# Patient Record
Sex: Male | Born: 1945 | Race: White | Hispanic: No | Marital: Married | Smoking: Never smoker
Health system: Southern US, Community
[De-identification: ages and names within clinical notes are randomized; demographics above are authoritative.]

## PROBLEM LIST (undated history)

## (undated) DIAGNOSIS — N400 Enlarged prostate without lower urinary tract symptoms: Secondary | ICD-10-CM

## (undated) DIAGNOSIS — K219 Gastro-esophageal reflux disease without esophagitis: Secondary | ICD-10-CM

## (undated) DIAGNOSIS — R0600 Dyspnea, unspecified: Secondary | ICD-10-CM

## (undated) DIAGNOSIS — N200 Calculus of kidney: Secondary | ICD-10-CM

## (undated) DIAGNOSIS — M1990 Unspecified osteoarthritis, unspecified site: Secondary | ICD-10-CM

## (undated) DIAGNOSIS — I351 Nonrheumatic aortic (valve) insufficiency: Secondary | ICD-10-CM

## (undated) DIAGNOSIS — E785 Hyperlipidemia, unspecified: Secondary | ICD-10-CM

## (undated) DIAGNOSIS — K7689 Other specified diseases of liver: Secondary | ICD-10-CM

## (undated) DIAGNOSIS — M25531 Pain in right wrist: Secondary | ICD-10-CM

## (undated) DIAGNOSIS — Z87442 Personal history of urinary calculi: Secondary | ICD-10-CM

## (undated) HISTORY — PX: ANTERIOR CRUCIATE LIGAMENT REPAIR: SHX115

## (undated) HISTORY — PX: OTHER SURGICAL HISTORY: SHX169

## (undated) HISTORY — PX: KNEE SURGERY: SHX244

## (undated) HISTORY — PX: MRI: SHX5353

## (undated) HISTORY — PX: TONSILLECTOMY: SUR1361

---

## 2008-02-13 ENCOUNTER — Ambulatory Visit (HOSPITAL_COMMUNITY): Admission: RE | Admit: 2008-02-13 | Discharge: 2008-02-13 | Payer: Self-pay | Admitting: Urology

## 2008-02-13 HISTORY — PX: CYSTOSCOPY/RETROGRADE/URETEROSCOPY: SHX5316

## 2008-11-13 ENCOUNTER — Encounter: Admission: RE | Admit: 2008-11-13 | Discharge: 2008-11-13 | Payer: Self-pay | Admitting: Family Medicine

## 2010-12-06 NOTE — Op Note (Signed)
NAME:  Billy Erickson, ZEE NO.:  [id number]   MEDICAL RECORD NO.:  [id number]          PATIENT TYPE:  AMB   LOCATION:  DAY                          FACILITY:  [facility]   PHYSICIAN:  Valetta Fuller, M.D.  DATE OF BIRTH:  1945-12-26   DATE OF PROCEDURE:  02/13/2008  DATE OF DISCHARGE:  02/13/2008                               OPERATIVE REPORT   PREOPERATIVE DIAGNOSIS:  Right distal ureteral calculus 9 mm.   POSTOPERATIVE DIAGNOSIS:  Right distal ureteral calculus 9 mm.   PROCEDURE PERFORMED:  Cystoscopy, right retrograde pyelography,  ureteroscopy, with holmium laser lithotripsy and basketing of fragments.   SURGEON:  Valetta Fuller, M.D.   ANESTHESIA:  General.   INDICATIONS:  Mr. Bierlein has a previous history of nephrolithiasis.  He  had come in with right-sided flank pain and had a CT scan.  There was an  area of suspicious calcification in the area of the proximal ureter, and  another suspicious calcification in the area of the distal ureter.  The  radiologist did not feel that he either of these calcifications lied  within the urinary tract.  Given his symptoms, however, we felt that he  probably did indeed have at least one of these within the ureter.  An  IVP was done which confirmed that the distal 9 mm calcification was  indeed within the distal ureter causing some mild obstruction.  We  discussed options with the patient.  We went over the pros and cons of  lithotripsy versus ureteroscopy and the potential complications and  success rates, and he elected to have ureteroscopy, which was my first  choice in his situation.  He now presents for the procedure.   TECHNIQUE AND FINDINGS:  The patient was brought to the operating room,  where he had successful induction of general anesthesia.  He was placed  in the lithotomy position and prepped and draped in usual manner.  Cystoscopy revealed an unremarkable anterior urethra.  The patient had  mild trilobar  hyperplasia, with mild visual obstruction.  Bladder was  otherwise endoscopically normal.   Retrograde pyelogram was done with an opened-tip catheter.  A filling  defect was noted in the distal right ureter, but no evidence of high-  grade obstruction, with minimal dilation of the ureter and collecting  system.  A guidewire was passed beyond the stone to the right renal  pelvis.   The right distal ureter was easily engaged with the short rigid  ureteroscope.  Several centimeters above the ureterovesical junction,  there was approximately a 9 x 6 mm calcification.  This did not appear  to be impacted or embedded in the ureter in any way, and there was  little in the way of mucosal changes.  A holmium laser lithotriptor was  used to break the stone into approximately a dozen to 14 pieces.  Some  of the larger 2-3 mm fragments were basket extracted into the bladder,  and then representative pieces obtained for stone analysis.  Repeat  ureteroscopy revealed no evidence of ureteral trauma, and again minimal  edema.  The patient  had requested every attempt to try to avoid a stent,  since he had had issues with the stent placement in the past.  We felt  that given the lack of dilation  in the lack of any real inflammation, that was reasonable to try him  without a double-J stent.  For that reason, the guidewire was removed.  Lidocaine jelly was instilled per urethra, and the patient was brought  to the recovery room in stable condition.           ______________________________  Valetta Fuller, M.D.  Electronically Signed     DSG/MEDQ  D:  02/14/2008  T:  02/14/2008  Job:  [id number]

## 2011-08-03 ENCOUNTER — Ambulatory Visit
Admission: RE | Admit: 2011-08-03 | Discharge: 2011-08-03 | Disposition: A | Discharge status: Home / Self Care | Payer: BC Managed Care – PPO | Source: Ambulatory Visit | Attending: Cardiology | Admitting: Cardiology

## 2011-08-03 ENCOUNTER — Other Ambulatory Visit: Payer: Self-pay | Admitting: Cardiology

## 2011-08-03 DIAGNOSIS — R0602 Shortness of breath: Secondary | ICD-10-CM

## 2012-09-23 ENCOUNTER — Encounter (HOSPITAL_COMMUNITY): Payer: Self-pay | Admitting: Emergency Medicine

## 2012-09-23 ENCOUNTER — Emergency Department (HOSPITAL_COMMUNITY)
Admission: EM | Admit: 2012-09-23 | Discharge: 2012-09-23 | Disposition: A | Discharge status: Home / Self Care | Payer: BC Managed Care – PPO | Attending: Emergency Medicine | Admitting: Emergency Medicine

## 2012-09-23 ENCOUNTER — Emergency Department (HOSPITAL_COMMUNITY): Payer: BC Managed Care – PPO

## 2012-09-23 DIAGNOSIS — M25561 Pain in right knee: Secondary | ICD-10-CM

## 2012-09-23 DIAGNOSIS — M25469 Effusion, unspecified knee: Secondary | ICD-10-CM | POA: Insufficient documentation

## 2012-09-23 DIAGNOSIS — S8990XA Unspecified injury of unspecified lower leg, initial encounter: Secondary | ICD-10-CM | POA: Insufficient documentation

## 2012-09-23 DIAGNOSIS — Y9301 Activity, walking, marching and hiking: Secondary | ICD-10-CM | POA: Insufficient documentation

## 2012-09-23 DIAGNOSIS — Y92410 Unspecified street and highway as the place of occurrence of the external cause: Secondary | ICD-10-CM | POA: Insufficient documentation

## 2012-09-23 HISTORY — DX: Calculus of kidney: N20.0

## 2012-09-23 MED ORDER — ZOLPIDEM TARTRATE 5 MG PO TABS: 5.0000 mg | ORAL_TABLET | Freq: Every evening | ORAL | Status: DC | PRN

## 2012-09-23 MED ORDER — FENTANYL CITRATE 0.05 MG/ML IJ SOLN
50.0000 ug | Freq: Once | INTRAMUSCULAR | Status: AC
  Administered 2012-09-23: 25 ug via INTRAVENOUS
  Filled 2012-09-23: qty 2

## 2012-09-23 MED ORDER — OXYCODONE-ACETAMINOPHEN 5-325 MG PO TABS: 1.0000 | ORAL_TABLET | Freq: Four times a day (QID) | ORAL | Status: DC | PRN

## 2012-09-23 MED ORDER — HYDROMORPHONE HCL PF 1 MG/ML IJ SOLN
1.0000 mg | Freq: Once | INTRAMUSCULAR | Status: AC
  Administered 2012-09-23: 1 mg via INTRAVENOUS
  Filled 2012-09-23: qty 1

## 2012-09-23 MED ORDER — FENTANYL CITRATE 0.05 MG/ML IJ SOLN
25.0000 ug | Freq: Once | INTRAMUSCULAR | Status: AC
  Administered 2012-09-23: 25 ug via INTRAVENOUS

## 2012-09-23 MED ORDER — SODIUM CHLORIDE 0.9 % IV SOLN
Freq: Once | INTRAVENOUS | Status: AC
  Administered 2012-09-23: 500 mL via INTRAVENOUS

## 2012-09-23 MED ORDER — ONDANSETRON HCL 4 MG/2ML IJ SOLN
4.0000 mg | Freq: Once | INTRAMUSCULAR | Status: AC
  Administered 2012-09-23: 4 mg via INTRAVENOUS
  Filled 2012-09-23: qty 2
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
## 2012-09-23 NOTE — ED Notes (Signed)
Patient is alert and oriented x3.  He was given DC instructions and follow up visit instructions.  Patient gave verbal understanding.  He was DC ambulatory under his own power to home.  V/S stable.  He was not showing any signs of distress on DC 
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
## 2012-09-24 ENCOUNTER — Ambulatory Visit
Admission: RE | Admit: 2012-09-24 | Discharge: 2012-09-24 | Disposition: A | Discharge status: Home / Self Care | Payer: BC Managed Care – PPO | Source: Ambulatory Visit | Attending: Orthopedic Surgery | Admitting: Orthopedic Surgery

## 2012-09-24 ENCOUNTER — Other Ambulatory Visit: Payer: Self-pay | Admitting: Orthopedic Surgery

## 2012-09-24 DIAGNOSIS — M25561 Pain in right knee: Secondary | ICD-10-CM

## 2012-09-24 DIAGNOSIS — M25569 Pain in unspecified knee: Secondary | ICD-10-CM | POA: Insufficient documentation

## 2012-11-12 DIAGNOSIS — S83209A Unspecified tear of unspecified meniscus, current injury, unspecified knee, initial encounter: Secondary | ICD-10-CM | POA: Insufficient documentation

## 2015-05-04 ENCOUNTER — Other Ambulatory Visit: Payer: Self-pay | Admitting: Orthopedic Surgery

## 2015-06-30 ENCOUNTER — Encounter (HOSPITAL_BASED_OUTPATIENT_CLINIC_OR_DEPARTMENT_OTHER): Payer: Self-pay | Admitting: *Deleted

## 2015-07-02 NOTE — H&P (Signed)
Billy Erickson is an 69 y.o. male.   CC / Reason for Visit: Right wrist pain HPI: This patient returns for reevaluation of the right wrist.  He reports that the injection into his radiocarpal joint made him better but not all together well and he would like to discuss the surgical options once again with Dr. Janee Mornhompson.  At this time, he feels as if the ulnar osteotomy is somewhat worrisome and that he's not sure if this in fact is something that he would like to do.  He would, however, like to proceed with the arthroscopic evaluation and treatment of the TFCC tear.  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  He reports that he's had pain on and off intermittently at times.  He was doing well until about 3-4 months ago when he began to have pain again, although still intermittent.  He reports that it feels as if there is "gravel" sometimes in the ulnar side of his wrist.  Today it isn't presently bothering him very much at all.  Yesterday he had about 2 hours where it was so painful he couldn't even type on a laptop.  He underwent MR arthrogram evaluation on 02-24-15, which reveals changes associated with ulnocarpal abutment, with a large tear of the TFCC.  Also noted was possibly some incomplete SL IL pathology.  Past Medical History  Diagnosis Date  . Kidney stones   . Wrist pain, right   . GERD (gastroesophageal reflux disease)     Past Surgical History  Procedure Laterality Date  . Anterior cruciate ligament repair    . Knee surgery Right     History reviewed. No pertinent family history. Social History:  reports that he has never smoked. He has never used smokeless tobacco. He reports that he drinks about 8.4 oz of alcohol per week. He reports that he does not use illicit drugs.  Allergies: No Known Allergies  No prescriptions prior to admission    No results found for this or any previous visit (from  the past 48 hour(s)). No results found.  Review of Systems  All other systems reviewed and are negative.   Height 5' 10.5" (1.791 m), weight 86.183 kg (190 lb). Physical Exam  Constitutional:  WD, WN, NAD HEENT:  NCAT, EOMI Neuro/Psych:  Alert & oriented to person, place, and time; appropriate mood & affect Lymphatic: No generalized UE edema or lymphadenopathy Extremities / MSK:  Both UE are normal with respect to appearance, ranges of motion, joint stability, muscle strength/tone, sensation, & perfusion except as otherwise noted:  The physical evaluation of this patient is virtually the same.  The right wrist is still not particularly swollen.  Wrist flexion to 50, extension to 60 with supination to 80 and pronation full.  Grip strength in position 2 continues to be: Right 100/left 110.  He still has pain with axial load, ulnar deviation and radial carpal grind with associated clicking.  Labs / Xrays:  No radiographic studies obtained today.  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  Plan: The spectrum of treatment was once again discussed at length.  The pros and cons of one stage versus 2 stage approach were also discussed.  The patient would like to proceed with arthroscopic debridement and ulnar shortening osteotomy in December as he has reached his yearly deductible.The details of the operative procedure were discussed with the  patient.  Questions were invited and answered.  In addition to the goal of the procedure, the risks of the procedure to include but not limited to bleeding; infection; damage to the nerves or blood vessels that could result in bleeding, numbness, weakness, chronic pain, and the need for additional procedures; stiffness; the need for revision surgery; and anesthetic risks were reviewed.  No specific outcome was guaranteed or implied.  Informed consent was obtained.   Gabor Lusk A. 07/02/2015, 6:08 PM

## 2015-07-06 ENCOUNTER — Ambulatory Visit (HOSPITAL_BASED_OUTPATIENT_CLINIC_OR_DEPARTMENT_OTHER): Payer: BC Managed Care – PPO | Admitting: Anesthesiology

## 2015-07-06 ENCOUNTER — Encounter (HOSPITAL_BASED_OUTPATIENT_CLINIC_OR_DEPARTMENT_OTHER)
Admission: RE | Disposition: A | Discharge status: Home / Self Care | Payer: Self-pay | Source: Ambulatory Visit | Attending: Orthopedic Surgery

## 2015-07-06 ENCOUNTER — Ambulatory Visit (HOSPITAL_COMMUNITY): Payer: BC Managed Care – PPO

## 2015-07-06 ENCOUNTER — Encounter (HOSPITAL_BASED_OUTPATIENT_CLINIC_OR_DEPARTMENT_OTHER): Payer: Self-pay | Admitting: Anesthesiology

## 2015-07-06 ENCOUNTER — Ambulatory Visit (HOSPITAL_BASED_OUTPATIENT_CLINIC_OR_DEPARTMENT_OTHER)
Admission: RE | Admit: 2015-07-06 | Discharge: 2015-07-06 | Disposition: A | Discharge status: Home / Self Care | Payer: BC Managed Care – PPO | Source: Ambulatory Visit | Attending: Orthopedic Surgery | Admitting: Orthopedic Surgery

## 2015-07-06 DIAGNOSIS — M24831 Other specific joint derangements of right wrist, not elsewhere classified: Secondary | ICD-10-CM | POA: Insufficient documentation

## 2015-07-06 DIAGNOSIS — K219 Gastro-esophageal reflux disease without esophagitis: Secondary | ICD-10-CM | POA: Insufficient documentation

## 2015-07-06 DIAGNOSIS — M25831 Other specified joint disorders, right wrist: Secondary | ICD-10-CM

## 2015-07-06 HISTORY — DX: Gastro-esophageal reflux disease without esophagitis: K21.9

## 2015-07-06 HISTORY — DX: Pain in right wrist: M25.531

## 2015-07-06 HISTORY — PX: WRIST ARTHROSCOPY WITH ULNA SHORTENING: SHX5681

## 2015-07-06 SURGERY — WRIST ARTHROSCOPY WITH ULNA SHORTENING
Anesthesia: Regional | Site: Wrist | Laterality: Right

## 2015-07-06 MED ORDER — LIDOCAINE HCL (CARDIAC) 20 MG/ML IV SOLN
INTRAVENOUS | Status: DC | PRN
  Administered 2015-07-06: 50 mg via INTRAVENOUS

## 2015-07-06 MED ORDER — DEXAMETHASONE SODIUM PHOSPHATE 10 MG/ML IJ SOLN
INTRAMUSCULAR | Status: AC
  Filled 2015-07-06: qty 1

## 2015-07-06 MED ORDER — MIDAZOLAM HCL 2 MG/2ML IJ SOLN
INTRAMUSCULAR | Status: AC
  Filled 2015-07-06: qty 2

## 2015-07-06 MED ORDER — SCOPOLAMINE 1 MG/3DAYS TD PT72: 1.0000 | MEDICATED_PATCH | Freq: Once | TRANSDERMAL | Status: DC

## 2015-07-06 MED ORDER — SUCCINYLCHOLINE CHLORIDE 20 MG/ML IJ SOLN
INTRAMUSCULAR | Status: AC
  Filled 2015-07-06: qty 1

## 2015-07-06 MED ORDER — PHENYLEPHRINE HCL 10 MG/ML IJ SOLN
INTRAMUSCULAR | Status: AC
  Filled 2015-07-06: qty 1

## 2015-07-06 MED ORDER — LIDOCAINE HCL (CARDIAC) 20 MG/ML IV SOLN
INTRAVENOUS | Status: AC
  Filled 2015-07-06: qty 5

## 2015-07-06 MED ORDER — MIDAZOLAM HCL 5 MG/5ML IJ SOLN
INTRAMUSCULAR | Status: DC | PRN
  Administered 2015-07-06: 2 mg via INTRAVENOUS

## 2015-07-06 MED ORDER — PROPOFOL 10 MG/ML IV BOLUS
INTRAVENOUS | Status: DC | PRN
  Administered 2015-07-06: 200 mg via INTRAVENOUS
  Administered 2015-07-06: 50 mg via INTRAVENOUS

## 2015-07-06 MED ORDER — FENTANYL CITRATE (PF) 100 MCG/2ML IJ SOLN
INTRAMUSCULAR | Status: AC
  Filled 2015-07-06: qty 2

## 2015-07-06 MED ORDER — FENTANYL CITRATE (PF) 100 MCG/2ML IJ SOLN
50.0000 ug | INTRAMUSCULAR | Status: DC | PRN
  Administered 2015-07-06: 100 ug via INTRAVENOUS

## 2015-07-06 MED ORDER — CEFAZOLIN SODIUM-DEXTROSE 2-3 GM-% IV SOLR
INTRAVENOUS | Status: AC
  Filled 2015-07-06: qty 50

## 2015-07-06 MED ORDER — GLYCOPYRROLATE 0.2 MG/ML IJ SOLN
INTRAMUSCULAR | Status: AC
  Filled 2015-07-06: qty 1

## 2015-07-06 MED ORDER — DEXAMETHASONE SODIUM PHOSPHATE 4 MG/ML IJ SOLN
INTRAMUSCULAR | Status: DC | PRN
  Administered 2015-07-06: 10 mg via INTRAVENOUS

## 2015-07-06 MED ORDER — HYDROMORPHONE HCL 1 MG/ML IJ SOLN: 0.2500 mg | INTRAMUSCULAR | Status: DC | PRN

## 2015-07-06 MED ORDER — FENTANYL CITRATE (PF) 100 MCG/2ML IJ SOLN: INTRAMUSCULAR | Status: DC | PRN

## 2015-07-06 MED ORDER — ONDANSETRON HCL 4 MG/2ML IJ SOLN
INTRAMUSCULAR | Status: AC
  Filled 2015-07-06: qty 2

## 2015-07-06 MED ORDER — BUPIVACAINE-EPINEPHRINE (PF) 0.5% -1:200000 IJ SOLN
INTRAMUSCULAR | Status: DC | PRN
  Administered 2015-07-06: 30 mL via PERINEURAL

## 2015-07-06 MED ORDER — CEFAZOLIN SODIUM-DEXTROSE 2-3 GM-% IV SOLR
2.0000 g | INTRAVENOUS | Status: AC
  Administered 2015-07-06: 2 g via INTRAVENOUS

## 2015-07-06 MED ORDER — GLYCOPYRROLATE 0.2 MG/ML IJ SOLN: 0.2000 mg | Freq: Once | INTRAMUSCULAR | Status: DC | PRN

## 2015-07-06 MED ORDER — ATROPINE SULFATE 0.4 MG/ML IJ SOLN
INTRAMUSCULAR | Status: AC
  Filled 2015-07-06: qty 1

## 2015-07-06 MED ORDER — EPHEDRINE SULFATE 50 MG/ML IJ SOLN
INTRAMUSCULAR | Status: AC
  Filled 2015-07-06: qty 1

## 2015-07-06 MED ORDER — PROPOFOL 10 MG/ML IV BOLUS
INTRAVENOUS | Status: AC
  Filled 2015-07-06: qty 40

## 2015-07-06 MED ORDER — ONDANSETRON HCL 4 MG/2ML IJ SOLN
INTRAMUSCULAR | Status: DC | PRN
  Administered 2015-07-06: 4 mg via INTRAVENOUS

## 2015-07-06 MED ORDER — PROPOFOL 10 MG/ML IV BOLUS
INTRAVENOUS | Status: AC
  Filled 2015-07-06: qty 20

## 2015-07-06 MED ORDER — PROMETHAZINE HCL 25 MG/ML IJ SOLN
6.2500 mg | INTRAMUSCULAR | Status: DC | PRN
Start: 2015-07-06 — End: 2015-07-06

## 2015-07-06 MED ORDER — FENTANYL CITRATE (PF) 100 MCG/2ML IJ SOLN
INTRAMUSCULAR | Status: DC | PRN
  Administered 2015-07-06 (×4): 25 ug via INTRAVENOUS

## 2015-07-06 MED ORDER — LACTATED RINGERS IV SOLN
INTRAVENOUS | Status: DC
  Administered 2015-07-06: 11:00:00 via INTRAVENOUS

## 2015-07-06 MED ORDER — OXYCODONE-ACETAMINOPHEN 5-325 MG PO TABS: 1.0000 | ORAL_TABLET | Freq: Four times a day (QID) | ORAL | Status: DC | PRN

## 2015-07-06 MED ORDER — MIDAZOLAM HCL 2 MG/2ML IJ SOLN
1.0000 mg | INTRAMUSCULAR | Status: DC | PRN
Start: 2015-07-06 — End: 2015-07-06
  Administered 2015-07-06: 2 mg via INTRAVENOUS

## 2015-07-06 MED ORDER — PHENYLEPHRINE HCL 10 MG/ML IJ SOLN
INTRAMUSCULAR | Status: DC | PRN
  Administered 2015-07-06 (×5): 40 ug via INTRAVENOUS

## 2015-07-06 MED ORDER — LACTATED RINGERS IV SOLN
INTRAVENOUS | Status: DC
  Administered 2015-07-06 (×3): via INTRAVENOUS

## 2015-07-06 MED ORDER — SODIUM CHLORIDE 0.9 % IR SOLN
Status: DC | PRN
Start: 2015-07-06 — End: 2015-07-06
  Administered 2015-07-06: 1

## 2015-07-06 MED ORDER — PHENYLEPHRINE 40 MCG/ML (10ML) SYRINGE FOR IV PUSH (FOR BLOOD PRESSURE SUPPORT)
PREFILLED_SYRINGE | INTRAVENOUS | Status: AC
  Filled 2015-07-06: qty 10

## 2015-07-06 MED ORDER — HYDROCODONE-ACETAMINOPHEN 7.5-325 MG PO TABS: 1.0000 | ORAL_TABLET | Freq: Once | ORAL | Status: DC | PRN

## 2015-07-06 SURGICAL SUPPLY — 87 items
BENZOIN TINCTURE PRP APPL 2/3 (GAUZE/BANDAGES/DRESSINGS) ×3 IMPLANT
BIT DRILL 2.8X5 QR DISP (BIT) ×3 IMPLANT
BIT DRILL QUICK RELEASE 3.5MM (BIT) ×1 IMPLANT
BLADE AVERAGE 25MMX9MM (BLADE)
BLADE AVERAGE 25X9 (BLADE) IMPLANT
BLADE CLIPPER SURG (BLADE) ×3 IMPLANT
BLADE MINI RND TIP GREEN BEAV (BLADE) IMPLANT
BLADE SAW OSTEOTOMY (BLADE) ×3 IMPLANT
BLADE SURG 15 STRL LF DISP TIS (BLADE) ×1 IMPLANT
BLADE SURG 15 STRL SS (BLADE) ×2
BNDG COHESIVE 4X5 TAN STRL (GAUZE/BANDAGES/DRESSINGS) ×3 IMPLANT
BNDG ESMARK 4X9 LF (GAUZE/BANDAGES/DRESSINGS) ×3 IMPLANT
BNDG GAUZE ELAST 4 BULKY (GAUZE/BANDAGES/DRESSINGS) ×6 IMPLANT
BUR CUDA 2.9 (BURR) ×2 IMPLANT
BUR CUDA 2.9MM (BURR) ×1
BUR FULL RADIUS 2.0 (BURR) ×2 IMPLANT
BUR FULL RADIUS 2.0MM (BURR) ×1
BUR FULL RADIUS 2.9 (BURR) IMPLANT
BUR FULL RADIUS 2.9MM (BURR)
BUR GATOR 2.9 (BURR) ×2 IMPLANT
BUR GATOR 2.9MM (BURR) ×1
CANISTER SUCT 1200ML W/VALVE (MISCELLANEOUS) ×3 IMPLANT
CHLORAPREP W/TINT 26ML (MISCELLANEOUS) ×3 IMPLANT
CLOSURE WOUND 1/2 X4 (GAUZE/BANDAGES/DRESSINGS) ×1
CORDS BIPOLAR (ELECTRODE) ×3 IMPLANT
COVER BACK TABLE 60X90IN (DRAPES) ×3 IMPLANT
COVER MAYO STAND STRL (DRAPES) ×3 IMPLANT
CUFF TOURNIQUET SINGLE 18IN (TOURNIQUET CUFF) ×3 IMPLANT
DECANTER SPIKE VIAL GLASS SM (MISCELLANEOUS) IMPLANT
DRAPE C-ARM 42X72 X-RAY (DRAPES) ×3 IMPLANT
DRAPE EXTREMITY T 121X128X90 (DRAPE) ×3 IMPLANT
DRAPE SURG 17X23 STRL (DRAPES) ×3 IMPLANT
DRILL QUICK RELEASE 3.5MM (BIT) ×3
DRSG ADAPTIC 3X8 NADH LF (GAUZE/BANDAGES/DRESSINGS) ×3 IMPLANT
DRSG EMULSION OIL 3X3 NADH (GAUZE/BANDAGES/DRESSINGS) ×3 IMPLANT
ELECT REM PT RETURN 9FT ADLT (ELECTROSURGICAL) ×3
ELECTRODE REM PT RTRN 9FT ADLT (ELECTROSURGICAL) ×1 IMPLANT
GAUZE SPONGE 4X4 12PLY STRL (GAUZE/BANDAGES/DRESSINGS) ×3 IMPLANT
GLOVE BIO SURGEON STRL SZ7.5 (GLOVE) ×3 IMPLANT
GLOVE BIOGEL PI IND STRL 7.0 (GLOVE) ×3 IMPLANT
GLOVE BIOGEL PI IND STRL 8 (GLOVE) ×1 IMPLANT
GLOVE BIOGEL PI INDICATOR 7.0 (GLOVE) ×6
GLOVE BIOGEL PI INDICATOR 8 (GLOVE) ×2
GLOVE ECLIPSE 6.5 STRL STRAW (GLOVE) ×6 IMPLANT
GOWN STRL REUS W/ TWL LRG LVL3 (GOWN DISPOSABLE) ×2 IMPLANT
GOWN STRL REUS W/TWL LRG LVL3 (GOWN DISPOSABLE) ×4
GOWN STRL REUS W/TWL XL LVL3 (GOWN DISPOSABLE) ×3 IMPLANT
GUIDEWIRE ORTHO 0.054X6 (WIRE) ×6 IMPLANT
LIQUID BAND (GAUZE/BANDAGES/DRESSINGS) IMPLANT
NEEDLE HYPO 22GX1.5 SAFETY (NEEDLE) ×3 IMPLANT
PACK BASIN DAY SURGERY FS (CUSTOM PROCEDURE TRAY) ×3 IMPLANT
PADDING CAST ABS 4INX4YD NS (CAST SUPPLIES) ×2
PADDING CAST ABS COTTON 4X4 ST (CAST SUPPLIES) ×1 IMPLANT
PENCIL BUTTON HOLSTER BLD 10FT (ELECTRODE) ×3 IMPLANT
PLATE ULNAR SHORTENING (Plate) ×3 IMPLANT
RETRIEVER SUT HEWSON (MISCELLANEOUS) IMPLANT
SCREW HEXALOBE LOCKING 3.5X14M (Screw) ×3 IMPLANT
SCREW HEXALOBE LOCKING 3.5X16M (Screw) ×3 IMPLANT
SCREW HEXALOBE NON-LOCK 3.5X14 (Screw) ×3 IMPLANT
SCREW HEXALOBE NON-LOCK 3.5X16 (Screw) ×6 IMPLANT
SCREW NON LOCKING HEX 3.5X18MM (Screw) ×3 IMPLANT
SCREW NONLOCK HEX 3.5X12 (Screw) ×3 IMPLANT
SET SM JOINT TUBING/CANN (CANNULA) ×3 IMPLANT
SHEET MEDIUM DRAPE 40X70 STRL (DRAPES) IMPLANT
SLING ARM FOAM STRAP LRG (SOFTGOODS) ×3 IMPLANT
SLING ARM MED ADULT FOAM STRAP (SOFTGOODS) IMPLANT
SLING ARM SM FOAM STRAP (SOFTGOODS) IMPLANT
SLING ARM XL FOAM STRAP (SOFTGOODS) IMPLANT
STOCKINETTE 6  STRL (DRAPES) ×2
STOCKINETTE 6 STRL (DRAPES) ×1 IMPLANT
STRIP CLOSURE SKIN 1/2X4 (GAUZE/BANDAGES/DRESSINGS) ×2 IMPLANT
SUCTION FRAZIER TIP 10 FR DISP (SUCTIONS) ×3 IMPLANT
SUT ETHIBOND 2 OS 4 DA (SUTURE) IMPLANT
SUT VIC AB 3-0 FS2 27 (SUTURE) ×3 IMPLANT
SUT VICRYL RAPIDE 4-0 (SUTURE) IMPLANT
SUT VICRYL RAPIDE 4/0 PS 2 (SUTURE) ×3 IMPLANT
SYR BULB 3OZ (MISCELLANEOUS) ×3 IMPLANT
SYRINGE 10CC LL (SYRINGE) ×3 IMPLANT
TOWEL OR 17X24 6PK STRL BLUE (TOWEL DISPOSABLE) ×3 IMPLANT
TOWEL OR NON WOVEN STRL DISP B (DISPOSABLE) IMPLANT
TRAP DIGIT (INSTRUMENTS) IMPLANT
TRAP FINGER LRG (INSTRUMENTS) ×3 IMPLANT
TUBE CONNECTING 20'X1/4 (TUBING) ×1
TUBE CONNECTING 20X1/4 (TUBING) ×2 IMPLANT
UNDERPAD 30X30 (UNDERPADS AND DIAPERS) ×3 IMPLANT
WAND 1.5 MICROBLATOR (SURGICAL WAND) ×3 IMPLANT
YANKAUER SUCT BULB TIP NO VENT (SUCTIONS) ×3 IMPLANT

## 2015-07-06 NOTE — Discharge Instructions (Addendum)
Discharge Instructions ° ° °You have a dressing with a plaster splint incorporated in it. °Move your fingers as much as possible, making a full fist and fully opening the fist. °Elevate your hand to reduce pain & swelling of the digits.  Ice over the operative site may be helpful to reduce pain & swelling.  DO NOT USE HEAT. °Pain medicine has been prescribed for you.  °Use your medicine as needed over the first 48 hours, and then you can begin to taper your use.  You may use Tylenol in place of your prescribed pain medication, but not IN ADDITION to it. °Leave the dressing in place until you return to our office.  °You may shower, but keep the bandage clean & dry.  °You may drive a car when you are off of prescription pain medications and can safely control your vehicle with both hands. ° ° °Please call [phone] during normal business hours or [phone] after hours for any problems. Including the following: ° °- excessive redness of the incisions °- drainage for more than 4 days °- fever of more than 101.5 F ° °*Please note that pain medications will not be refilled after hours or on weekends. ° ° °Post Anesthesia Home Care Instructions ° °Activity: °Get plenty of rest for the remainder of the day. A responsible adult should stay with you for 24 hours following the procedure.  °For the next 24 hours, DO NOT: °-Drive a car °-Operate machinery °-Drink alcoholic beverages °-Take any medication unless instructed by your physician °-Make any legal decisions or sign important papers. ° °Meals: °Start with liquid foods such as gelatin or soup. Progress to regular foods as tolerated. Avoid greasy, spicy, heavy foods. If nausea and/or vomiting occur, drink only clear liquids until the nausea and/or vomiting subsides. Call your physician if vomiting continues. ° °Special Instructions/Symptoms: °Your throat may feel dry or sore from the anesthesia or the breathing tube placed in your throat during surgery. If this  causes discomfort, gargle with warm salt water. The discomfort should disappear within 24 hours. ° °If you had a scopolamine patch placed behind your ear for the management of post- operative nausea and/or vomiting: ° °1. The medication in the patch is effective for 72 hours, after which it should be removed.  Wrap patch in a tissue and discard in the trash. Wash hands thoroughly with soap and water. °2. You may remove the patch earlier than 72 hours if you experience unpleasant side effects which may include dry mouth, dizziness or visual disturbances. °3. Avoid touching the patch. Wash your hands with soap and water after contact with the patch. °Regional Anesthesia Blocks ° °1. Numbness or the inability to move the "blocked" extremity may last from 3-48 hours after placement. The length of time depends on the medication injected and your individual response to the medication. If the numbness is not going away after 48 hours, call your surgeon. ° °2. The extremity that is blocked will need to be protected until the numbness is gone and the  Strength has returned. Because you cannot feel it, you will need to take extra care to avoid injury. Because it may be weak, you may have difficulty moving it or using it. You may not know what position it is in without looking at it while the block is in effect. ° °3. For blocks in the legs and feet, returning to weight bearing and walking needs to be done carefully. You will need to wait until the numbness   is entirely gone and the strength has returned. You should be able to move your leg and foot normally before you try and bear weight or walk. You will need someone to be with you when you first try to ensure you do not fall and possibly risk injury. ° °4. Bruising and tenderness at the needle site are common side effects and will resolve in a few days. ° °5. Persistent numbness or new problems with movement should be communicated to the surgeon or the [facility] ([phone])/ [facility] ([phone]). °  ° ° °

## 2015-07-06 NOTE — Transfer of Care (Signed)
Immediate Anesthesia Transfer of Care Note  Patient: Billy Erickson  Procedure(s) Performed: Procedure(s): RIGHT WRIST ARTHROSCOPIC DEBRIDEMENT AND ULNAR SHORTENING OSTEOTOMY (Right)  Patient Location: PACU  Anesthesia Type:GA combined with regional for post-op pain  Level of Consciousness: sedated and patient cooperative  Airway & Oxygen Therapy: Patient Spontanous Breathing and Patient connected to face mask oxygen  Post-op Assessment: Report given to RN and Post -op Vital signs reviewed and stable  Post vital signs: Reviewed and stable  Last Vitals:  Filed Vitals:   07/06/15 1207 07/06/15 1208  BP:    Pulse: 73 70  Temp:    Resp: 15 14    Complications: No apparent anesthesia complications

## 2015-07-06 NOTE — Anesthesia Procedure Notes (Addendum)
Anesthesia Regional Block:  Axillary brachial plexus block  Pre-Anesthetic Checklist: ,, timeout performed, Correct Patient, Correct Site, Correct Laterality, Correct Procedure, Correct Position, site marked, Risks and benefits discussed,  Surgical consent,  Pre-op evaluation,  At surgeon's request and post-op pain management  Laterality: Right  Prep: chloraprep       Needles:   Needle Type: Echogenic Stimulator Needle     Needle Length: 9cm 9 cm Needle Gauge: 21 and 21 G    Additional Needles:  Procedures: ultrasound guided (picture in chart) and nerve stimulator Axillary brachial plexus block  Nerve Stimulator or Paresthesia:  Response: musculocutaneous, 0.5 mA,  Response: median, 0.5 mA,  Response: ulnar, 0.5 mA,   Additional Responses:  Radial nerve at 0.815mA Narrative:  Start time: 07/06/2015 11:58 AM End time: 07/06/2015 12:06 PM Injection made incrementally with aspirations every 5 mL.  Performed by: Personally  Anesthesiologist: Marcene DuosFITZGERALD, ROBERT  Additional Notes: Risks and benefits discussed. Pt tolerated well with no immediate complications.   Procedure Name: LMA Insertion Date/Time: 07/06/2015 12:45 PM Performed by: Genevieve NorlanderLINKA, Jailyn Leeson L Pre-anesthesia Checklist: Patient identified, Emergency Drugs available, Suction available, Patient being monitored and Timeout performed Patient Re-evaluated:Patient Re-evaluated prior to inductionOxygen Delivery Method: Circle System Utilized Preoxygenation: Pre-oxygenation with 100% oxygen Intubation Type: IV induction Ventilation: Mask ventilation without difficulty LMA: LMA inserted LMA Size: 5.0 Number of attempts: 1 Airway Equipment and Method: Bite block Placement Confirmation: positive ETCO2 Tube secured with: Tape Dental Injury: Teeth and Oropharynx as per pre-operative assessment

## 2015-07-06 NOTE — Progress Notes (Signed)
Assisted Dr. Rob Fitzgerald with right, ultrasound guided, axillary block. Side rails up, monitors on throughout procedure. See vital signs in flow sheet. Tolerated Procedure well. 

## 2015-07-06 NOTE — Interval H&P Note (Signed)
History and Physical Interval Note:  07/06/2015 12:27 PM  Billy KocherWilliam N Bridgers  has presented today for surgery, with the diagnosis of RIGHT WRIST ULNAR IMPACTION SYNDROME  The various methods of treatment have been discussed with the patient and family. After consideration of risks, benefits and other options for treatment, the patient has consented to  Procedure(s): RIGHT WRIST ARTHROSCOPIC DEBRIDEMENT AND ULNAR SHORTENING OSTEOTOMY (Right) as a surgical intervention .  The patient's history has been reviewed, patient examined, no change in status, stable for surgery.  I have reviewed the patient's chart and labs.  Questions were answered to the patient's satisfaction.     [name]

## 2015-07-06 NOTE — Op Note (Signed)
07/06/2015  12:28 PM  PATIENT:  Billy Erickson  69 y.o. male  PRE-OPERATIVE DIAGNOSIS:  Right ulnar carpal abutment  POST-OPERATIVE DIAGNOSIS:  Same  PROCEDURE:  Right wrist arthroscopy with TFCC debridement & microfracture of lunate and triquetrum, and open ulnar shortening osteotomy  SURGEON: Cliffton Astersavid A. Janee Mornhompson, MD  PHYSICIAN ASSISTANT: Danielle RankinKirsten Schrader, OPA-C  ANESTHESIA:  regional and general  SPECIMENS:  None  DRAINS:   None  EBL:  less than 50 mL  PREOPERATIVE INDICATIONS:  Billy Erickson is a  69 y.o. 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   The risks benefits and alternatives were discussed with the patient preoperatively including but not limited to the risks of infection, bleeding, nerve injury, cardiopulmonary complications, the need for revision surgery, among others, and the patient verbalized understanding and consented to proceed.  OPERATIVE IMPLANTS: Acumed ulnar shortening osteotomy plate and appropriate screws  OPERATIVE PROCEDURE:  After receiving prophylactic antibiotics and a regional block , the patient was escorted to the operative theatre and placed in a supine position.   General anesthesia was administeredA surgical "time-out" was performed during which the planned procedure, proposed operative site, and the correct patient identity were compared to the operative consent and agreement confirmed by the circulating nurse according to current facility policy.  Following application of a tourniquet to the operative extremity, the exposed skin was prepped with Chloraprep and draped in the usual sterile fashion.  The limb was exsanguinated with an Esmarch bandage and the tourniquet inflated to approximately 100mmHg higher than systolic BP.   The limb was positioned in the L[employer] traction tower and a small incision was made over the normal 3-4 portal region. Spreading dissection was carried out of the capsule and hemostat was  used to create a portal. The scope was then inserted and visualization commenced. There was some synovitis throughout the wrist, the radial compartment was in pretty good condition. 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 In addition, there was a large complex TFCC tear with essentially some shredding of the TFCC, leaving palmar and dorsal limbs intact. The head of the ulna could be seen and there was only a scant patch of cartilage left on the ulnar as well. A 6R portal was established and using the suction shaver, arthroscopic basket punches, and the micro-ablator, the largest portion of the central part of the TFCC was excised. The palmar and dorsal limbs were left. The grade 2-3 chondromalacia on  the lunate facet of the radius was debrided, as was the edge of the intact cartilage on the lunate. The edge was found to be fairly stable. Microfracture set was used to microfracture this portion of the lunate and triquetrum. The arthroscopic estimates are then removed and attention directed to the ulnar shortening osteotomy.  A direct linear approach was employed over the subcutaneous border of the ulna. The Acumed plate was employed in standard fashion, creating a 3 mm osteotomy of the ulna. Initially, the osteotomy was gapping slightly on the side opposite the plate, plate was removed and recontoured and this allowed for anatomic reapproximation of the ends. The plate was ultimately applied with 2 locking screws, as well as decompressing interfragmentary screw. There were 3 screws proximal, and 3 distal to the interfragmentary screws. Care was taken to reflect and protect and preserve the musculature surrounding the  site of the osteotomy. In addition, the irrigant was dripped along the osteotomy site to prevent thermal injury to the bone. Final images were obtained revealing  acceptable shortening of the ulna and pronation supination were preserved and full. The wound was irrigated and the fascia was closed with 3-0 Vicryl running sutures followed by deflation of the tourniquet. Some additional hemostasis was obtained and the skin was closed with 3-0 Vicryl deep dermal buried sutures with running 4-0 Vicryl Rapide subcuticular suture followed by benzoin and Steri-Strips. The arthroscopy portal incisions were closed with 4-0 Vicryl Rapide interrupted sutures. A short arm splint dressing was applied with volar plaster component and he was awakened and taken to room stable condition, breathing spontaneously  DISPOSITION: He'll be discharged home today with typical instructions returning in 10-15 days at which time he should have new x-rays of the right forearm out of splint and transition to hand therapy to have a custom short arm splint constructed and begin rehabilitation.

## 2015-07-06 NOTE — Anesthesia Preprocedure Evaluation (Addendum)
Anesthesia Evaluation  Patient identified by MRN, date of birth, ID band Patient awake    Reviewed: Allergy & Precautions, NPO status , Patient's Chart, lab work & pertinent test results  Airway Mallampati: II  TM Distance: >3 FB Neck ROM: Full    Dental  (+) Dental Advisory Given   Pulmonary neg pulmonary ROS,    breath sounds clear to auscultation       Cardiovascular negative cardio ROS   Rhythm:Regular Rate:Normal     Neuro/Psych negative neurological ROS     GI/Hepatic Neg liver ROS, GERD  ,  Endo/Other  negative endocrine ROS  Renal/GU negative Renal ROS     Musculoskeletal   Abdominal   Peds  Hematology negative hematology ROS (+)   Anesthesia Other Findings   Reproductive/Obstetrics                            Anesthesia Physical Anesthesia Plan  ASA: II  Anesthesia Plan: General and Regional   Post-op Pain Management: GA combined w/ Regional for post-op pain   Induction: Intravenous  Airway Management Planned: LMA  Additional Equipment:   Intra-op Plan:   Post-operative Plan: Extubation in OR  Informed Consent: I have reviewed the patients History and Physical, chart, labs and discussed the procedure including the risks, benefits and alternatives for the proposed anesthesia with the patient or authorized representative who has indicated his/her understanding and acceptance.     Plan Discussed with: CRNA  Anesthesia Plan Comments:        Anesthesia Quick Evaluation

## 2015-07-07 ENCOUNTER — Encounter (HOSPITAL_BASED_OUTPATIENT_CLINIC_OR_DEPARTMENT_OTHER): Payer: Self-pay | Admitting: Orthopedic Surgery

## 2015-07-07 NOTE — Anesthesia Postprocedure Evaluation (Signed)
Anesthesia Post Note  Patient: Billy Erickson  Procedure(s) Performed: Procedure(s) (LRB): RIGHT WRIST WITH  ARTHROSCOPIC DEBRIDEMENT AND ULNAR SHORTENING OSTEOTOMY (Right)  Patient location during evaluation: PACU Anesthesia Type: Regional and General Level of consciousness: awake and alert Pain management: pain level controlled Vital Signs Assessment: post-procedure vital signs reviewed and stable Respiratory status: spontaneous breathing Cardiovascular status: blood pressure returned to baseline Anesthetic complications: no    Last Vitals:  Filed Vitals:   07/06/15 1515 07/06/15 1548  BP: 106/74 118/78  Pulse: 75 79  Temp:    Resp: 21     Last Pain:  Filed Vitals:   07/06/15 1549  PainSc: 0-No pain                 Kennieth RadFitzgerald, Purcell Jungbluth E

## 2016-05-12 ENCOUNTER — Encounter: Payer: Self-pay | Admitting: Cardiology

## 2016-05-23 ENCOUNTER — Encounter: Payer: Self-pay | Admitting: Cardiology

## 2016-05-23 ENCOUNTER — Other Ambulatory Visit: Payer: Self-pay | Admitting: Cardiology

## 2016-05-23 DIAGNOSIS — R0602 Shortness of breath: Secondary | ICD-10-CM

## 2016-05-23 DIAGNOSIS — R0600 Dyspnea, unspecified: Secondary | ICD-10-CM | POA: Insufficient documentation

## 2016-05-23 DIAGNOSIS — R9439 Abnormal result of other cardiovascular function study: Secondary | ICD-10-CM | POA: Insufficient documentation

## 2016-05-23 NOTE — Progress Notes (Signed)
Billy Erickson, Billy Erickson  Date of visit:  05/12/2016 DOB:  [birth year]    Age:  70 yrs. Medical record number:  [id number]     Account number:  [id number] Primary Care Provider: Iva Erickson ____________________________ CURRENT DIAGNOSES  1. Dyspnea  2. Family History Of Other Congenital Malformations, Deformations And Chromosomal Abnormalities  3. Hyperlipidemia  4. Gastro-esophageal reflux disease ____________________________ ALLERGIES  No Known Allergies ____________________________ MEDICATIONS  1. esomeprazole magnesium 20 mg capsule,delayed release, 1 p.o. daily  2. Tylenol PM Extra Strength 25 mg-500 mg tablet, PRN  3. ibuprofen 200 mg tablet, PRN ____________________________ CHIEF COMPLAINTS  Dyspnea with exertion ____________________________ HISTORY OF PRESENT ILLNESS  This very nice 70 year old male is seen for evaluation of a family history of bicuspid valve as well as dyspnea. Patient has previously been in good health. A sister recently had aortic valve replacement for bicuspid aortic valve and he reports that several other family members have had similar. He was recommended that he have screening for this by the previous cardiac surgeon. He also has noted some dyspnea. He is able to exercise but does have some dyspnea on going up hills. He reports being evaluated with a treadmill test about 5 years ago that was normal. He does not have any angina. He denies PND, orthopnea, syncope, palpitations, or claudication. He had one time was on a statin agent but is currently off of that right now. ____________________________ PAST HISTORY  Past Medical Illnesses:  hyperlipidemia, GERD, kidney stones;  Cardiovascular Illnesses:  no previous history of cardiac disease;  Infectious Diseases:  no previous history of significant infectious diseases;  Surgical Procedures:  wrist surgery, arthroscopic knee surgery, eyelid surgery;  Trauma History:  no previous history of significant trauma;  NYHA  Classification:  I;  Canadian Angina Classification:  Class 0: Asymptomatic;  Cardiology Procedures-Invasive:  no previous interventional or invasive cardiology procedures;  Cardiology Procedures-Noninvasive:  no previous non-invasive cardiovascular testing;  Peripheral Vascular Procedures:  no previous invasive peripheral vascular procedures.;  LVEF not documented,   ____________________________ CARDIO-PULMONARY TEST DATES EKG Date:  05/12/2016;   ____________________________ FAMILY HISTORY Brother -- Brother dead, Malignant melanoma Father -- Father dead, COPD, Coronary Artery Disease Mother -- Mother dead, Alzheimers disease Sister -- Armed forces training and education officerAlive and well Sister -- Sister alive and well ____________________________ SOCIAL HISTORY Alcohol Use:  wine and about 6 glasses per week;  Smoking:  never smoked;  Diet:  regular diet;  Lifestyle:  married;  Exercise:  exercises regularly;  Occupation:  college professor;  Residence:  lives with wife;   ____________________________ REVIEW OF SYSTEMS General:  obesity  Integumentary:no rashes or new skin lesions. Eyes: wears eye glasses/contact lenses, denies diplopia, glaucoma or visual field defects. Ears, Nose, Throat, Mouth:  hoarseness Respiratory: mild dyspnea with exertion Cardiovascular:  please review HPI Abdominal: history of GERD Genitourinary-Male: no dysuria, urgency, frequency, or nocturia  Musculoskeletal:  arthritis of the right shoulder knee Neurological:  denies headaches, stroke, or TIA Psychiatric:  denies depression or anxiety Hematological/Immunologic:  denies any food allergies, bleeding disorders. ____________________________ PHYSICAL EXAMINATION VITAL SIGNS  Blood Pressure:  112/68 Sitting, Right arm, regular cuff  , 90/64 Standing, Right arm and regular cuff   Pulse:  92/min. Weight:  197.00 lbs. Height:  70"BMI: 28  Constitutional:  pleasant white male in no acute distress, mildly obese Skin:  warm and dry to touch, no apparent  skin lesions, or masses noted. Head:  normocephalic, normal hair pattern, no masses or tenderness Eyes:  EOMS Intact, PERRLA, C and  S clear, Funduscopic exam not done. ENT:  ears, nose and throat reveal no gross abnormalities.  Dentition good. Neck:  supple, without massess. No JVD, thyromegaly or carotid bruits. Carotid upstroke normal. Chest:  normal symmetry, clear to auscultation. Cardiac:  regular rhythm, normal S1 and S2, No S3 or S4, no murmurs, gallops or rubs detected. Abdomen:  abdomen soft,non-tender, no masses, no hepatospenomegaly, or aneurysm noted Peripheral Pulses:  the femoral,dorsalis pedis, and posterior tibial pulses are full and equal bilaterally with no bruits auscultated. Extremities & Back:  no deformities, clubbing, cyanosis, erythema or edema observed. Normal muscle strength and tone. Neurological:  no gross motor or sensory deficits noted, affect appropriate, oriented x3. ____________________________ IMPRESSIONS/PLAN  1. Dyspnea on exertion of uncertain etiology 2. Family history of bicuspid aortic valve 3. Mild hyperlipidemia by history Recommendations:  His cardiac exam was normal but in light of the family history of possible bicuspid aortic valve as well as dyspnea recommended echocardiogram. His EKG does not show any ischemic changes and recommended a standard exercise treadmill test to further evaluate. ____________________________ TODAYS ORDERS  1. treadmill:  Regular TM First Available  2. 2D, color flow, doppler: First Available  3. 12 Lead EKG: Today                      ____________________________ Cardiology Physician:  Darden PalmerW. Spencer Billy Elmquist, [name] MD [facility]

## 2016-05-23 NOTE — CV Procedure (Signed)
  Billy Erickson, Billy Erickson  Date of visit:  05/23/2016 DOB:  [birth year]    Age:  70 yrs. Medical record number:  [id number]     Account number:  [id number] Primary Care Provider: Iva BoopVIA,KEVIN  CURRENT DIAGNOSES  1. Dyspnea  2. Family History Of Other Congenital Malformations, Deformations And Chromosomal Abnormalities  3. Hyperlipidemia  4. Gastro-esophageal reflux disease    TREADMILL  The patient exercised on the standard Bruce protocol a total of 9 minutes into Bruce stage 3 her achieving a workload of 10 METS. The test was stopped due to dyspnea and fatigue. The patient had no chest pain suggestive of angina. Heart rate rose to 150 which was 99% of predicted maximum. Blood pressure response was normal.  12-lead EKG is normal at rest. With exercise the patient achieved target heart rate and had no one-2 mm of ST segment depression consistent with ischemia in the lateral leads without changes in recovery.. No arrhythmias occurred.  IMPRESSIONS:  1. Clinically negative for ischemia 2. EKG positive for ischemia  Recommendations:  The patient has an echocardiogram pending to evaluate his aortic valve for bicuspid disease. He has ST depression and has had exertional dyspnea. He has significant hyperlipidemia and has been started on statin therapy. We discussed options of either myocardial perfusion scanning or with CTA to further evaluate the abnormal treadmill in the setting of dyspnea. We will see if a CTA can be approved and try to get this taken care of.     Cardiology Physician:  Darden PalmerW. Spencer Tilley, [name] MD [facility]

## 2016-08-11 ENCOUNTER — Encounter (HOSPITAL_BASED_OUTPATIENT_CLINIC_OR_DEPARTMENT_OTHER): Payer: Self-pay | Admitting: *Deleted

## 2016-08-14 ENCOUNTER — Encounter (HOSPITAL_BASED_OUTPATIENT_CLINIC_OR_DEPARTMENT_OTHER): Payer: Self-pay | Admitting: *Deleted

## 2016-08-14 NOTE — Progress Notes (Signed)
Cardiac testing reviewed with Dr Renold DonGermeroth, [place] [facility].

## 2016-08-16 NOTE — H&P (Signed)
[facility] [address]   [address] [place], [place] [id number] [phone] A Division of [facility]  RE:  Billy Erickson, Billy Erickson   [id number]   DOB: 10-18-1945 08-11-2016  Reason for visit:  Referral from Dr. Farris HasKramer with a left knee medial meniscus tear seen on MRI, as well as some chondromalacia that is pretty minimal.  HISTORY OF PRESENT ILLNESS: He has had persistent mechanical symptoms for a few months. He squatted down and felt a pop quite a while ago. He denies any startup pain. He has treated this with NSAIDs and activity modification.   PHYSICAL EXAM: Well appearing male, in no apparent distress. Midline pain. Positive McMurray.  IMAGING: MRI results noted above.  ASSESSMENT & PLAN: Doing well at this time. I discussed risks and benefits of a knee scope, chondroplasty and medial meniscectomy.   Billy Baizeimothy D.  Eulah Erickson, M.D.   Electronically verified by Billy Erickson, M.D.  Rolinda RoanDM:kab D 08-11-2016 T 08-14-2016

## 2016-08-18 ENCOUNTER — Ambulatory Visit (HOSPITAL_BASED_OUTPATIENT_CLINIC_OR_DEPARTMENT_OTHER): Payer: BC Managed Care – PPO | Admitting: Certified Registered"

## 2016-08-18 ENCOUNTER — Encounter (HOSPITAL_BASED_OUTPATIENT_CLINIC_OR_DEPARTMENT_OTHER)
Admission: RE | Disposition: A | Discharge status: Home / Self Care | Payer: Self-pay | Source: Ambulatory Visit | Attending: Orthopedic Surgery

## 2016-08-18 ENCOUNTER — Encounter (HOSPITAL_BASED_OUTPATIENT_CLINIC_OR_DEPARTMENT_OTHER): Payer: Self-pay | Admitting: Certified Registered"

## 2016-08-18 ENCOUNTER — Ambulatory Visit (HOSPITAL_BASED_OUTPATIENT_CLINIC_OR_DEPARTMENT_OTHER)
Admission: RE | Admit: 2016-08-18 | Discharge: 2016-08-18 | Disposition: A | Discharge status: Home / Self Care | Payer: BC Managed Care – PPO | Source: Ambulatory Visit | Attending: Orthopedic Surgery | Admitting: Orthopedic Surgery

## 2016-08-18 DIAGNOSIS — X58XXXA Exposure to other specified factors, initial encounter: Secondary | ICD-10-CM | POA: Diagnosis not present

## 2016-08-18 DIAGNOSIS — S83282A Other tear of lateral meniscus, current injury, left knee, initial encounter: Secondary | ICD-10-CM | POA: Diagnosis not present

## 2016-08-18 DIAGNOSIS — S83242D Other tear of medial meniscus, current injury, left knee, subsequent encounter: Secondary | ICD-10-CM

## 2016-08-18 DIAGNOSIS — S83242A Other tear of medial meniscus, current injury, left knee, initial encounter: Secondary | ICD-10-CM | POA: Insufficient documentation

## 2016-08-18 DIAGNOSIS — M2242 Chondromalacia patellae, left knee: Secondary | ICD-10-CM | POA: Diagnosis not present

## 2016-08-18 HISTORY — DX: Hyperlipidemia, unspecified: E78.5

## 2016-08-18 HISTORY — DX: Personal history of urinary calculi: Z87.442

## 2016-08-18 HISTORY — PX: KNEE ARTHROSCOPY WITH MEDIAL MENISECTOMY: SHX5651

## 2016-08-18 HISTORY — DX: Dyspnea, unspecified: R06.00

## 2016-08-18 SURGERY — ARTHROSCOPY, KNEE, WITH MEDIAL MENISCECTOMY
Anesthesia: General | Site: Knee | Laterality: Left

## 2016-08-18 MED ORDER — LACTATED RINGERS IV SOLN
INTRAVENOUS | Status: DC
  Administered 2016-08-18 (×2): via INTRAVENOUS

## 2016-08-18 MED ORDER — BUPIVACAINE HCL (PF) 0.5 % IJ SOLN
INTRAMUSCULAR | Status: AC
  Filled 2016-08-18: qty 30

## 2016-08-18 MED ORDER — FENTANYL CITRATE (PF) 100 MCG/2ML IJ SOLN
INTRAMUSCULAR | Status: AC
  Filled 2016-08-18: qty 2

## 2016-08-18 MED ORDER — CEFAZOLIN SODIUM-DEXTROSE 2-4 GM/100ML-% IV SOLN
INTRAVENOUS | Status: AC
  Filled 2016-08-18: qty 100

## 2016-08-18 MED ORDER — ONDANSETRON HCL 4 MG/2ML IJ SOLN
INTRAMUSCULAR | Status: DC | PRN
  Administered 2016-08-18: 4 mg via INTRAVENOUS

## 2016-08-18 MED ORDER — OXYCODONE-ACETAMINOPHEN 5-325 MG PO TABS: 1.0000 | ORAL_TABLET | ORAL | 0 refills | Status: DC | PRN

## 2016-08-18 MED ORDER — OXYCODONE HCL 5 MG PO TABS
5.0000 mg | ORAL_TABLET | Freq: Once | ORAL | Status: AC | PRN
  Administered 2016-08-18: 5 mg via ORAL

## 2016-08-18 MED ORDER — CHLORHEXIDINE GLUCONATE 4 % EX LIQD: 60.0000 mL | Freq: Once | CUTANEOUS | Status: DC

## 2016-08-18 MED ORDER — ACETAMINOPHEN 500 MG PO TABS
1000.0000 mg | ORAL_TABLET | Freq: Once | ORAL | Status: AC
  Administered 2016-08-18: 1000 mg via ORAL

## 2016-08-18 MED ORDER — LIDOCAINE 2% (20 MG/ML) 5 ML SYRINGE
INTRAMUSCULAR | Status: DC | PRN
  Administered 2016-08-18: 100 mg via INTRAVENOUS

## 2016-08-18 MED ORDER — HYDROMORPHONE HCL 1 MG/ML IJ SOLN
0.2500 mg | INTRAMUSCULAR | Status: DC | PRN
  Administered 2016-08-18 (×2): 0.5 mg via INTRAVENOUS

## 2016-08-18 MED ORDER — METHYLPREDNISOLONE ACETATE 80 MG/ML IJ SUSP
INTRAMUSCULAR | Status: AC
  Filled 2016-08-18: qty 1

## 2016-08-18 MED ORDER — LIDOCAINE 2% (20 MG/ML) 5 ML SYRINGE
INTRAMUSCULAR | Status: AC
  Filled 2016-08-18: qty 5

## 2016-08-18 MED ORDER — FENTANYL CITRATE (PF) 100 MCG/2ML IJ SOLN
50.0000 ug | INTRAMUSCULAR | Status: AC | PRN
  Administered 2016-08-18 (×2): 50 ug via INTRAVENOUS
  Administered 2016-08-18 (×2): 25 ug via INTRAVENOUS
  Administered 2016-08-18: 50 ug via INTRAVENOUS

## 2016-08-18 MED ORDER — BACLOFEN 10 MG PO TABS: 10.0000 mg | ORAL_TABLET | Freq: Three times a day (TID) | ORAL | 0 refills | Status: DC | PRN

## 2016-08-18 MED ORDER — ONDANSETRON HCL 4 MG PO TABS: 4.0000 mg | ORAL_TABLET | Freq: Three times a day (TID) | ORAL | 0 refills | Status: DC | PRN

## 2016-08-18 MED ORDER — SCOPOLAMINE 1 MG/3DAYS TD PT72: 1.0000 | MEDICATED_PATCH | Freq: Once | TRANSDERMAL | Status: DC | PRN

## 2016-08-18 MED ORDER — BUPIVACAINE HCL (PF) 0.25 % IJ SOLN
INTRAMUSCULAR | Status: AC
  Filled 2016-08-18: qty 30

## 2016-08-18 MED ORDER — PROPOFOL 10 MG/ML IV BOLUS
INTRAVENOUS | Status: DC | PRN
  Administered 2016-08-18: 200 mg via INTRAVENOUS

## 2016-08-18 MED ORDER — DEXAMETHASONE SODIUM PHOSPHATE 4 MG/ML IJ SOLN
INTRAMUSCULAR | Status: DC | PRN
  Administered 2016-08-18: 10 mg via INTRAVENOUS

## 2016-08-18 MED ORDER — ASPIRIN EC 81 MG PO TBEC: 81.0000 mg | DELAYED_RELEASE_TABLET | Freq: Every day | ORAL | 0 refills | Status: DC

## 2016-08-18 MED ORDER — DEXAMETHASONE SODIUM PHOSPHATE 10 MG/ML IJ SOLN
INTRAMUSCULAR | Status: AC
  Filled 2016-08-18: qty 1

## 2016-08-18 MED ORDER — BUPIVACAINE HCL 0.25 % IJ SOLN
INTRAMUSCULAR | Status: DC | PRN
  Administered 2016-08-18: 4 mL via INTRA_ARTICULAR

## 2016-08-18 MED ORDER — OXYCODONE HCL 5 MG PO TABS
ORAL_TABLET | ORAL | Status: AC
  Filled 2016-08-18: qty 1

## 2016-08-18 MED ORDER — CEFAZOLIN SODIUM-DEXTROSE 2-4 GM/100ML-% IV SOLN
2.0000 g | INTRAVENOUS | Status: AC
  Administered 2016-08-18: 2 g via INTRAVENOUS

## 2016-08-18 MED ORDER — PROPOFOL 500 MG/50ML IV EMUL
INTRAVENOUS | Status: AC
  Filled 2016-08-18: qty 50

## 2016-08-18 MED ORDER — DOCUSATE SODIUM 100 MG PO CAPS: 100.0000 mg | ORAL_CAPSULE | Freq: Two times a day (BID) | ORAL | 0 refills | Status: DC

## 2016-08-18 MED ORDER — LACTATED RINGERS IV SOLN: INTRAVENOUS | Status: DC

## 2016-08-18 MED ORDER — HYDROMORPHONE HCL 1 MG/ML IJ SOLN
INTRAMUSCULAR | Status: AC
  Filled 2016-08-18: qty 1

## 2016-08-18 MED ORDER — MIDAZOLAM HCL 2 MG/2ML IJ SOLN: 1.0000 mg | INTRAMUSCULAR | Status: DC | PRN

## 2016-08-18 MED ORDER — ACETAMINOPHEN 500 MG PO TABS
ORAL_TABLET | ORAL | Status: AC
  Filled 2016-08-18: qty 2

## 2016-08-18 MED ORDER — SODIUM CHLORIDE 0.9 % IR SOLN
Status: DC | PRN
  Administered 2016-08-18: 3000 mL

## 2016-08-18 MED ORDER — ONDANSETRON HCL 4 MG/2ML IJ SOLN
INTRAMUSCULAR | Status: AC
  Filled 2016-08-18: qty 2

## 2016-08-18 SURGICAL SUPPLY — 38 items
BANDAGE ACE 6X5 VEL STRL LF (GAUZE/BANDAGES/DRESSINGS) ×3 IMPLANT
BLADE CUDA 5.5 (BLADE) IMPLANT
BLADE CUDA GRT WHITE 3.5 (BLADE) IMPLANT
BLADE CUTTER GATOR 3.5 (BLADE) ×3 IMPLANT
BLADE CUTTER MENIS 5.5 (BLADE) IMPLANT
CHLORAPREP W/TINT 26ML (MISCELLANEOUS) ×3 IMPLANT
CUFF TOURNIQUET SINGLE 34IN LL (TOURNIQUET CUFF) ×3 IMPLANT
CUTTER MENISCUS  4.2MM (BLADE)
CUTTER MENISCUS 4.2MM (BLADE) IMPLANT
DRAPE ARTHROSCOPY W/POUCH 90 (DRAPES) ×3 IMPLANT
DRAPE IMP U-DRAPE 54X76 (DRAPES) ×3 IMPLANT
DRAPE U-SHAPE 47X51 STRL (DRAPES) ×3 IMPLANT
DRSG EMULSION OIL 3X3 NADH (GAUZE/BANDAGES/DRESSINGS) ×3 IMPLANT
GAUZE SPONGE 4X4 12PLY STRL (GAUZE/BANDAGES/DRESSINGS) ×6 IMPLANT
GLOVE BIO SURGEON STRL SZ7.5 (GLOVE) ×6 IMPLANT
GLOVE BIOGEL PI IND STRL 7.0 (GLOVE) ×1 IMPLANT
GLOVE BIOGEL PI IND STRL 8 (GLOVE) ×2 IMPLANT
GLOVE BIOGEL PI IND STRL 8.5 (GLOVE) IMPLANT
GLOVE BIOGEL PI INDICATOR 7.0 (GLOVE) ×2
GLOVE BIOGEL PI INDICATOR 8 (GLOVE) ×4
GLOVE BIOGEL PI INDICATOR 8.5 (GLOVE)
GLOVE ECLIPSE 6.5 STRL STRAW (GLOVE) ×3 IMPLANT
GOWN STRL REUS W/ TWL LRG LVL3 (GOWN DISPOSABLE) ×3 IMPLANT
GOWN STRL REUS W/ TWL XL LVL3 (GOWN DISPOSABLE) ×1 IMPLANT
GOWN STRL REUS W/TWL LRG LVL3 (GOWN DISPOSABLE) ×6
GOWN STRL REUS W/TWL XL LVL3 (GOWN DISPOSABLE) ×2
KNEE WRAP E Z 3 GEL PACK (MISCELLANEOUS) ×3 IMPLANT
MANIFOLD NEPTUNE II (INSTRUMENTS) ×3 IMPLANT
PACK ARTHROSCOPY DSU (CUSTOM PROCEDURE TRAY) ×3 IMPLANT
PACK BASIN DAY SURGERY FS (CUSTOM PROCEDURE TRAY) ×3 IMPLANT
PROBE BIPOLAR ATHRO 135MM 90D (MISCELLANEOUS) IMPLANT
SET ARTHROSCOPY TUBING (MISCELLANEOUS) ×2
SET ARTHROSCOPY TUBING LN (MISCELLANEOUS) ×1 IMPLANT
SUT ETHILON 3 0 PS 1 (SUTURE) ×3 IMPLANT
TAPE CLOTH 3X10 TAN LF (GAUZE/BANDAGES/DRESSINGS) IMPLANT
TOWEL OR 17X24 6PK STRL BLUE (TOWEL DISPOSABLE) ×3 IMPLANT
TOWEL OR NON WOVEN STRL DISP B (DISPOSABLE) ×3 IMPLANT
WATER STERILE IRR 1000ML POUR (IV SOLUTION) ×3 IMPLANT

## 2016-08-18 NOTE — Interval H&P Note (Signed)
History and Physical Interval Note:  08/18/2016 12:37 PM  Billy Erickson  has presented today for surgery, with the diagnosis of CHONDROMALACIA PATELLA, LEFT KNEE, OTHER TEAR OF MEDIAL MENISCUS CURRENT INJURY, LEFT KNEE TEAR OF LATERAL MENISCUS CURRENT INJURY   The various methods of treatment have been discussed with the patient and family. After consideration of risks, benefits and other options for treatment, the patient has consented to  Procedure(s): LEFT KNEE SCOPE CHONDROPLASTY, MEDIAL AND LATERAL MENISCECTOMY (Left) as a surgical intervention .  The patient's history has been reviewed, patient examined, no change in status, stable for surgery.  I have reviewed the patient's chart and labs.  Questions were answered to the patient's satisfaction.     Noor Witte D

## 2016-08-18 NOTE — Anesthesia Procedure Notes (Signed)
Procedure Name: LMA Insertion Date/Time: 08/18/2016 1:54 PM Performed by: Curly ShoresRAFT, Brya Simerly W Pre-anesthesia Checklist: Patient identified, Emergency Drugs available, Suction available and Patient being monitored Patient Re-evaluated:Patient Re-evaluated prior to inductionOxygen Delivery Method: Circle system utilized Preoxygenation: Pre-oxygenation with 100% oxygen Intubation Type: IV induction Ventilation: Mask ventilation without difficulty LMA: LMA inserted LMA Size: 4.0 Number of attempts: 1 Airway Equipment and Method: Bite block Placement Confirmation: positive ETCO2 and breath sounds checked- equal and bilateral Tube secured with: Tape Dental Injury: Teeth and Oropharynx as per pre-operative assessment

## 2016-08-18 NOTE — Op Note (Signed)
08/18/2016  2:39 PM  PATIENT:  Billy Erickson    PRE-OPERATIVE DIAGNOSIS:  CHONDROMALACIA PATELLA, LEFT KNEE, OTHER TEAR OF MEDIAL MENISCUS CURRENT INJURY, LEFT KNEE TEAR OF LATERAL MENISCUS CURRENT INJURY   POST-OPERATIVE DIAGNOSIS:  Same  PROCEDURE:  LEFT KNEE ARTHROSCOPY, CHONDROPLASTY AND PARTIAL MEDIAL AND LATERAL MENISCECTOMY  SURGEON:  Stephie Xu D, MD  ASSISTANT: none  ANESTHESIA:   General  BLOOD LOSS: min  COMPLICATIONS: None   PREOPERATIVE INDICATIONS:  Billy Erickson is a  71 y.o. male with a diagnosis of CHONDROMALACIA PATELLA, LEFT KNEE, OTHER TEAR OF MEDIAL MENISCUS CURRENT INJURY, LEFT KNEE TEAR OF LATERAL MENISCUS CURRENT INJURY  who failed conservative measures and elected for surgical management.    The risks benefits and alternatives were discussed with the patient preoperatively including but not limited to the risks of infection, bleeding, nerve injury, cardiopulmonary complications, the need for revision surgery, among others, and the patient was willing to proceed.  OPERATIVE IMPLANTS: none  OPERATIVE FINDINGS: Examination under anesthesia: stable Diagnostic Arthroscopy:  articular cartilage:Grad 1 MFC Medial meniscus:parrot beak tear Lateral meniscus:small radial tear Anterior cruciate ligament/PCL: stable Loose bodies: none    OPERATIVE PROCEDURE:  Patient was identified in the preoperative holding area and site was marked by me male was transported to the operating theater and placed on the table in supine position taking care to pad all bony prominences. After a preincinduction time out anesthesia was induced.  male received ancef for preoperative antibiotics. The left lower extremity was prepped and draped in normal sterile fashion and a pre-incision timeout was performed.   A small stab incision was made in the anterolateral portal position. The arthroscope was introduced in the joint. A medial portal was then established under direct  visualization just above the anterior horn of the medial meniscus. Diagnostic arthroscopy was then carried out with findings as described above.  I performed a chondroplasty in the PF space and the MFC.   Next I performed a partial lateral meniscectomy  Next I performed a partial medial meniscectomy  The arthroscopic equipment was removed from the joint and the portals were closed with 3-0 nylon in an interrupted fashion. The knee was infiltrated with Depomedrol.  Sterile dressings were then applied including Xeroform 4 x 4's ABDs an ACE bandage.  The patient was then allowed to awaken from general anesthesia, transferred to the stretcher and taken to the recovery room in stable condition.  POSTOPERATIVE PLAN: The patient will be discharged home today and will followup in one week for suture removal and wound check.  VTE prophylaxis: mobilize and ASA

## 2016-08-18 NOTE — Anesthesia Postprocedure Evaluation (Signed)
Anesthesia Post Note  Patient: Billy Erickson  Procedure(s) Performed: Procedure(s) (LRB): LEFT KNEE ARTHROSCOPY, CHONDROPLASTY AND PARTIAL MEDIAL AND LATERAL MENISCECTOMY (Left)  Patient location during evaluation: PACU Anesthesia Type: General Level of consciousness: awake Pain management: pain level controlled Respiratory status: spontaneous breathing Anesthetic complications: no       Last Vitals:  Vitals:   08/18/16 1515 08/18/16 1545  BP: (!) 125/97 (!) 146/95  Pulse: 70 79  Resp: 16 18  Temp:  36.6 C    Last Pain:  Vitals:   08/18/16 1545  TempSrc:   PainSc: 3                  Pharell Rolfson

## 2016-08-18 NOTE — Anesthesia Preprocedure Evaluation (Addendum)
Anesthesia Evaluation  Patient identified by MRN, date of birth, ID band Patient awake    Reviewed: Allergy & Precautions, NPO status , Patient's Chart, lab work & pertinent test results  Airway Mallampati: II  TM Distance: >3 FB     Dental   Pulmonary shortness of breath,    breath sounds clear to auscultation       Cardiovascular negative cardio ROS   Rhythm:Regular Rate:Normal     Neuro/Psych    GI/Hepatic Neg liver ROS, GERD  ,  Endo/Other  negative endocrine ROS  Renal/GU Renal disease     Musculoskeletal   Abdominal   Peds  Hematology   Anesthesia Other Findings   Reproductive/Obstetrics                             Anesthesia Physical Anesthesia Plan  ASA: III  Anesthesia Plan: General   Post-op Pain Management:    Induction: Intravenous  Airway Management Planned: LMA  Additional Equipment:   Intra-op Plan:   Post-operative Plan: Extubation in OR  Informed Consent: I have reviewed the patients History and Physical, chart, labs and discussed the procedure including the risks, benefits and alternatives for the proposed anesthesia with the patient or authorized representative who has indicated his/her understanding and acceptance.   Dental advisory given  Plan Discussed with: CRNA and Anesthesiologist  Anesthesia Plan Comments:         Anesthesia Quick Evaluation

## 2016-08-18 NOTE — Discharge Instructions (Signed)
Diet: As you were doing prior to hospitalization   Dressing:  You may shower over incisions and change your dressing 3 days after surgery.    Activity:  Increase activity slowly as tolerated, but follow the weight bearing instructions below.  The rules on driving is that you can not be taking narcotics while you drive, and you must feel in control of the vehicle.    Weight Bearing: As tolerated   To prevent constipation: you may use a stool softener such as -  Colace (over the counter) 100 mg by mouth twice a day  Drink plenty of fluids (prune juice may be helpful) and high fiber foods Miralax (over the counter) for constipation as needed.    Itching:  If you experience itching with your medications, try taking only a single pain pill, or even half a pain pill at a time.  You may take up to 10 pain pills per day, and you can also use benadryl over the counter for itching or also to help with sleep.   Precautions:  If you experience chest pain or shortness of breath - call 911 immediately for transfer to the hospital emergency department!!  If you develop a fever greater that 101 F, purulent drainage from wound, increased redness or drainage from wound, or calf pain -- Call the office at [phone]                                                Follow- Up Appointment:  Please call for an appointment to be seen in 2 weeks [place] - [phone]   Post Anesthesia Home Care Instructions  Activity: Get plenty of rest for the remainder of the day. A responsible adult should stay with you for 24 hours following the procedure.  For the next 24 hours, DO NOT: -Drive a car -Advertising copywriterperate machinery -Drink alcoholic beverages -Take any medication unless instructed by your physician -Make any legal decisions or sign important papers.  Meals: Start with liquid foods such as gelatin or soup. Progress to regular foods as tolerated. Avoid greasy, spicy, heavy foods. If nausea and/or vomiting occur,  drink only clear liquids until the nausea and/or vomiting subsides. Call your physician if vomiting continues.  Special Instructions/Symptoms: Your throat may feel dry or sore from the anesthesia or the breathing tube placed in your throat during surgery. If this causes discomfort, gargle with warm salt water. The discomfort should disappear within 24 hours.  If you had a scopolamine patch placed behind your ear for the management of post- operative nausea and/or vomiting:  1. The medication in the patch is effective for 72 hours, after which it should be removed.  Wrap patch in a tissue and discard in the trash. Wash hands thoroughly with soap and water. 2. You may remove the patch earlier than 72 hours if you experience unpleasant side effects which may include dry mouth, dizziness or visual disturbances. 3. Avoid touching the patch. Wash your hands with soap and water after contact with the patch.

## 2016-08-18 NOTE — Transfer of Care (Signed)
Immediate Anesthesia Transfer of Care Note  Patient: Billy Erickson  Procedure(s) Performed: Procedure(s): LEFT KNEE ARTHROSCOPY, CHONDROPLASTY AND PARTIAL MEDIAL AND LATERAL MENISCECTOMY (Left)  Patient Location: PACU  Anesthesia Type:General  Level of Consciousness: awake, alert , oriented and patient cooperative  Airway & Oxygen Therapy: Patient Spontanous Breathing and Patient connected to face mask oxygen  Post-op Assessment: Report given to RN, Post -op Vital signs reviewed and stable and Patient moving all extremities  Post vital signs: Reviewed and stable  Last Vitals:  Vitals:   08/18/16 1243  BP: 136/82  Pulse: 66  Resp: 16  Temp: 36.8 C    Last Pain:  Vitals:   08/18/16 1243  TempSrc: Oral  PainSc: 1       Patients Stated Pain Goal: 1 (08/18/16 1243)  Complications: No apparent anesthesia complications

## 2016-08-21 ENCOUNTER — Encounter (HOSPITAL_BASED_OUTPATIENT_CLINIC_OR_DEPARTMENT_OTHER): Payer: Self-pay | Admitting: Orthopedic Surgery

## 2017-03-13 ENCOUNTER — Encounter (HOSPITAL_COMMUNITY): Payer: Self-pay | Admitting: Emergency Medicine

## 2017-03-13 ENCOUNTER — Emergency Department (HOSPITAL_COMMUNITY): Payer: BC Managed Care – PPO

## 2017-03-13 ENCOUNTER — Emergency Department (HOSPITAL_COMMUNITY)
Admission: EM | Admit: 2017-03-13 | Discharge: 2017-03-13 | Disposition: A | Discharge status: Home / Self Care | Payer: BC Managed Care – PPO | Attending: Emergency Medicine | Admitting: Emergency Medicine

## 2017-03-13 DIAGNOSIS — Z79899 Other long term (current) drug therapy: Secondary | ICD-10-CM | POA: Diagnosis not present

## 2017-03-13 DIAGNOSIS — N200 Calculus of kidney: Secondary | ICD-10-CM | POA: Insufficient documentation

## 2017-03-13 DIAGNOSIS — N23 Unspecified renal colic: Secondary | ICD-10-CM

## 2017-03-13 DIAGNOSIS — R1032 Left lower quadrant pain: Secondary | ICD-10-CM | POA: Diagnosis present

## 2017-03-13 DIAGNOSIS — R110 Nausea: Secondary | ICD-10-CM | POA: Insufficient documentation

## 2017-03-13 DIAGNOSIS — Z7982 Long term (current) use of aspirin: Secondary | ICD-10-CM | POA: Insufficient documentation

## 2017-03-13 DIAGNOSIS — N1330 Unspecified hydronephrosis: Secondary | ICD-10-CM | POA: Insufficient documentation

## 2017-03-13 LAB — I-STAT CHEM 8, ED
BUN: 30 mg/dL — AB (ref 6–20)
CREATININE: 1.3 mg/dL — AB (ref 0.61–1.24)
Calcium, Ion: 1.19 mmol/L (ref 1.15–1.40)
Chloride: 108 mmol/L (ref 101–111)
Glucose, Bld: 110 mg/dL — ABNORMAL HIGH (ref 65–99)
HEMATOCRIT: 46 % (ref 39.0–52.0)
Hemoglobin: 15.6 g/dL (ref 13.0–17.0)
POTASSIUM: 4 mmol/L (ref 3.5–5.1)
Sodium: 143 mmol/L (ref 135–145)
TCO2: 23 mmol/L (ref 0–100)

## 2017-03-13 LAB — URINALYSIS, ROUTINE W REFLEX MICROSCOPIC
BILIRUBIN URINE: NEGATIVE
Glucose, UA: NEGATIVE mg/dL
Ketones, ur: 5 mg/dL — AB
LEUKOCYTES UA: NEGATIVE
Nitrite: NEGATIVE
PH: 7 (ref 5.0–8.0)
Protein, ur: 30 mg/dL — AB
SPECIFIC GRAVITY, URINE: 1.019 (ref 1.005–1.030)
SQUAMOUS EPITHELIAL / LPF: NONE SEEN

## 2017-03-13 MED ORDER — MORPHINE SULFATE (PF) 2 MG/ML IV SOLN
4.0000 mg | Freq: Once | INTRAVENOUS | Status: AC
  Administered 2017-03-13: 4 mg via INTRAVENOUS
  Filled 2017-03-13: qty 2

## 2017-03-13 MED ORDER — MORPHINE SULFATE (PF) 2 MG/ML IV SOLN
4.0000 mg | Freq: Once | INTRAVENOUS | Status: DC
  Filled 2017-03-13: qty 2

## 2017-03-13 MED ORDER — ONDANSETRON HCL 4 MG/2ML IJ SOLN
4.0000 mg | Freq: Once | INTRAMUSCULAR | Status: AC
  Administered 2017-03-13: 4 mg via INTRAVENOUS
  Filled 2017-03-13: qty 2

## 2017-03-13 MED ORDER — KETOROLAC TROMETHAMINE 30 MG/ML IJ SOLN
30.0000 mg | Freq: Once | INTRAMUSCULAR | Status: AC
  Administered 2017-03-13: 30 mg via INTRAVENOUS
  Filled 2017-03-13: qty 1

## 2017-03-13 MED ORDER — OXYCODONE-ACETAMINOPHEN 5-325 MG PO TABS
1.0000 | ORAL_TABLET | Freq: Once | ORAL | Status: AC
  Administered 2017-03-13: 1 via ORAL
  Filled 2017-03-13: qty 1

## 2017-03-13 MED ORDER — SODIUM CHLORIDE 0.9 % IV BOLUS (SEPSIS)
500.0000 mL | Freq: Once | INTRAVENOUS | Status: AC
  Administered 2017-03-13: 500 mL via INTRAVENOUS

## 2017-03-13 MED ORDER — HYDROCODONE-ACETAMINOPHEN 5-325 MG PO TABS: 1.0000 | ORAL_TABLET | ORAL | 0 refills | Status: DC | PRN

## 2017-03-13 NOTE — ED Provider Notes (Signed)
[facility]-EMERGENCY DEPT Provider Note   CSN: [id number] Arrival date & time: 03/13/17  0945     History   Chief Complaint Chief Complaint  Patient presents with  . Flank Pain  . Diarrhea  . Emesis    HPI Billy Erickson is a 71 y.o. male.  Patient is a 71 year old male with a history of kidney since she's presents with sudden onset of left flank pain. He states about 8:00 this morning he started feeling nauseated and then about an hour later developed sudden pain to his right back which radiates to his right lower abdomen. He has a history of kidney stones in the past and states this feels similar. He's had associated nausea but no vomiting. No fevers. He is currently having some difficulty urinating. He's had both lithotripsy and stone retrieval is in the past. He's followed by [facility]. He has not taken anything at home for the pain.      Past Medical History:  Diagnosis Date  . Dyspnea    with exercise only  . GERD (gastroesophageal reflux disease)   . History of kidney stones   . Hyperlipidemia   . Kidney stones   . Wrist pain, right     Patient Active Problem List   Diagnosis Date Noted  . Abnormal cardiovascular stress test 05/23/2016  . Dyspnea 05/23/2016    Past Surgical History:  Procedure Laterality Date  . ANTERIOR CRUCIATE LIGAMENT REPAIR    . CYSTOSCOPY/RETROGRADE/URETEROSCOPY  02/13/2008   basketing of fragments  . KNEE ARTHROSCOPY WITH MEDIAL MENISECTOMY Left 08/18/2016   Procedure: LEFT KNEE ARTHROSCOPY, CHONDROPLASTY AND PARTIAL MEDIAL AND LATERAL MENISCECTOMY;  Surgeon: Sheral Apley, MD;  Location: [facility];  Service: Orthopedics;  Laterality: Left;  . KNEE SURGERY Right   . WRIST ARTHROSCOPY WITH ULNA SHORTENING Right 07/06/2015   Procedure: RIGHT WRIST WITH  ARTHROSCOPIC DEBRIDEMENT AND ULNAR SHORTENING OSTEOTOMY;  Surgeon: Mack Hook, MD;  Location: [facility];  Service: Orthopedics;  Laterality:  Right;       Home Medications    Prior to Admission medications   Medication Sig Start Date End Date Taking? Authorizing Provider  aspirin EC 81 MG tablet Take 1 tablet (81 mg total) by mouth daily. 08/18/16  Yes Albina Billet III, PA-C  ibuprofen (ADVIL,MOTRIN) 600 MG tablet Take 600 mg by mouth every 6 (six) hours as needed for moderate pain.    Yes [provider]  Multiple Vitamin (MULTIVITAMIN WITH MINERALS) TABS tablet Take 1 tablet by mouth daily.   Yes [provider]  omeprazole (PRILOSEC) 20 MG capsule Take 20 mg by mouth daily.   Yes [provider]  ondansetron (ZOFRAN) 4 MG tablet Take 1 tablet (4 mg total) by mouth every 8 (eight) hours as needed for nausea or vomiting. 08/18/16  Yes Albina Billet III, PA-C  rosuvastatin (CRESTOR) 10 MG tablet Take 10 mg by mouth at bedtime.    Yes [provider]  zolpidem (AMBIEN) 5 MG tablet Take 1 tablet (5 mg total) by mouth at bedtime as needed for sleep. 09/23/12  Yes Gwyneth Sprout, MD  baclofen (LIORESAL) 10 MG tablet Take 1 tablet (10 mg total) by mouth 3 (three) times daily as needed for muscle spasms. Patient not taking: Reported on 03/13/2017 08/18/16   Albina Billet III, PA-C  docusate sodium (COLACE) 100 MG capsule Take 1 capsule (100 mg total) by mouth 2 (two) times daily. To prevent constipation while taking pain  medication. Patient not taking: Reported on 03/13/2017 08/18/16   Albina Billet III, PA-C  HYDROcodone-acetaminophen (NORCO/VICODIN) 5-325 MG tablet Take 1-2 tablets by mouth every 4 (four) hours as needed. 03/13/17   Rolan Bucco, MD  oxyCODONE-acetaminophen (ROXICET) 5-325 MG tablet Take 1-2 tablets by mouth every 4 (four) hours as needed for severe pain. Patient not taking: Reported on 03/13/2017 08/18/16   Albina Billet III, PA-C    Family History No family history on file.  Social History Social History  Substance Use Topics  .  Smoking status: Never Smoker  . Smokeless tobacco: Never Used  . Alcohol use 8.4 oz/week    14 Glasses of wine per week     Comment: social     Allergies   Patient has no known allergies.   Review of Systems Review of Systems  Constitutional: Negative for chills, diaphoresis, fatigue and fever.  HENT: Negative for congestion, rhinorrhea and sneezing.   Eyes: Negative.   Respiratory: Negative for cough, chest tightness and shortness of breath.   Cardiovascular: Negative for chest pain and leg swelling.  Gastrointestinal: Positive for abdominal pain and nausea. Negative for blood in stool, diarrhea and vomiting.  Genitourinary: Positive for flank pain. Negative for difficulty urinating, frequency and hematuria.  Musculoskeletal: Positive for back pain. Negative for arthralgias.  Skin: Negative for rash.  Neurological: Negative for dizziness, speech difficulty, weakness, numbness and headaches.     Physical Exam Updated Vital Signs BP (!) 141/94   Pulse (!) 53   Temp 97.8 F (36.6 C) (Oral)   Resp (!) 25   Ht 5\' 10"  (1.778 m)   Wt 85.3 kg (188 lb)   SpO2 100%   BMI 26.98 kg/m   Physical Exam  Constitutional: He is oriented to person, place, and time. He appears well-developed and well-nourished. He appears distressed.  Appears uncomfortable  HENT:  Head: Normocephalic and atraumatic.  Eyes: Pupils are equal, round, and reactive to light.  Neck: Normal range of motion. Neck supple.  Cardiovascular: Normal rate, regular rhythm and normal heart sounds.   Pulmonary/Chest: Effort normal and breath sounds normal. No respiratory distress. He has no wheezes. He has no rales. He exhibits no tenderness.  Abdominal: Soft. Bowel sounds are normal. There is tenderness. There is no rebound and no guarding.  Tenderness in the left flank area and left lower abdomen  Musculoskeletal: Normal range of motion. He exhibits no edema.  Lymphadenopathy:    He has no cervical adenopathy.    Neurological: He is alert and oriented to person, place, and time.  Skin: Skin is warm and dry. No rash noted.  Psychiatric: He has a normal mood and affect.     ED Treatments / Results  Labs (all labs ordered are listed, but only abnormal results are displayed) Labs Reviewed  URINALYSIS, ROUTINE W REFLEX MICROSCOPIC - Abnormal; Notable for the following:       Result Value   APPearance HAZY (*)    Hgb urine dipstick LARGE (*)    Ketones, ur 5 (*)    Protein, ur 30 (*)    Bacteria, UA RARE (*)    All other components within normal limits  I-STAT CHEM 8, ED - Abnormal; Notable for the following:    BUN 30 (*)    Creatinine, Ser 1.30 (*)    Glucose, Bld 110 (*)    All other components within normal limits    EKG  EKG Interpretation None  Radiology Ct Renal Stone Study  Result Date: 03/13/2017 CLINICAL DATA:  Left flank pain EXAM: CT ABDOMEN AND PELVIS WITHOUT CONTRAST TECHNIQUE: Multidetector CT imaging of the abdomen and pelvis was performed following the standard protocol without IV contrast. COMPARISON:  01/31/2008.  Ultrasound 11/13/2008 FINDINGS: Lower chest: No acute abnormality. Hepatobiliary: Multiple low-density lesions throughout the liver. These have enlarged since prior CT from 2010. The largest is in the inferior right hepatic lobe measuring 4.7 cm. Diffuse fatty infiltration of the liver. Gallbladder unremarkable. Pancreas: No focal abnormality or ductal dilatation. Spleen: No focal abnormality.  Normal size. Adrenals/Urinary Tract: Bilateral renal cysts. Mild left hydronephrosis. No ureteral stones. Nonobstructing stones in the upper pole of the right kidney and lower pole of the left kidney. Adrenal glands and urinary bladder unremarkable. Stomach/Bowel: Normal appendix. Stomach, large and small bowel grossly unremarkable. Vascular/Lymphatic: Aortic and iliac calcifications. No aneurysm or adenopathy. Reproductive: Prostate enlargement. Other: No free fluid or  free air. Prominent tubular structures in the left inguinal region may reflect varicocele. Musculoskeletal: No acute bony abnormality. IMPRESSION: Mild left hydronephrosis without visible ureteral stone. Findings could be related to recently passed stone. Bilateral nephrolithiasis. Bilateral renal and hepatic cysts. The hepatic cysts have enlarged significantly since 2010. These cannot be fully characterized on this noncontrast study and could be further evaluated with contrast-enhanced CT or MRI. Aortoiliac atherosclerosis. Prostate enlargement. Possible left inguinal varicocele. Electronically Signed   By: Charlett Nose M.D.   On: 03/13/2017 11:31    Procedures Procedures (including critical care time)  Medications Ordered in ED Medications  ketorolac (TORADOL) 30 MG/ML injection 30 mg (30 mg Intravenous Given 03/13/17 1058)  morphine 2 MG/ML injection 4 mg (4 mg Intravenous Given 03/13/17 1055)  ondansetron (ZOFRAN) injection 4 mg (4 mg Intravenous Given 03/13/17 1051)  sodium chloride 0.9 % bolus 500 mL (0 mLs Intravenous Stopped 03/13/17 1125)  oxyCODONE-acetaminophen (PERCOCET/ROXICET) 5-325 MG per tablet 1 tablet (1 tablet Oral Given 03/13/17 1307)     Initial Impression / Assessment and Plan / ED Course  I have reviewed the triage vital signs and the nursing notes.  Pertinent labs & imaging results that were available during my care of the patient were reviewed by me and considered in my medical decision making (see chart for details).     Pt feels better after treatment in the ED.His CT scan shows evidence of a possible recently passed stone. There is no evidence of urinary infection. He's otherwise well-appearing. He was discharged home in good condition. Return precautions were given. He was encouraged to follow-up with his urologist if his symptoms continue.  Final Clinical Impressions(s) / ED Diagnoses   Final diagnoses:  Renal colic on left side    New Prescriptions New  Prescriptions   HYDROCODONE-ACETAMINOPHEN (NORCO/VICODIN) 5-325 MG TABLET    Take 1-2 tablets by mouth every 4 (four) hours as needed.     Rolan Bucco, MD 03/13/17 [phone]

## 2017-03-13 NOTE — ED Triage Notes (Signed)
Patient c/o left flank pain that started couple hours ago with v/d without any actual vomiting.  Patient denies any urinary problems today nut states that he hasnt looked to see if any blood in urine or not. Pt PMH of kidney stones.

## 2017-03-13 NOTE — Discharge Instructions (Signed)
YOU HAVE CYSTS ON YOUR KIDNEYS AND LIVER THAT NEED FURTHER EVALUATION BY YOUR PRIMARY CARE DOCTOR.

## 2017-03-15 ENCOUNTER — Other Ambulatory Visit: Payer: Self-pay | Admitting: Urology

## 2017-03-16 ENCOUNTER — Encounter (HOSPITAL_COMMUNITY): Payer: Self-pay | Admitting: *Deleted

## 2017-03-16 ENCOUNTER — Other Ambulatory Visit: Payer: Self-pay | Admitting: Family Medicine

## 2017-03-16 DIAGNOSIS — K7689 Other specified diseases of liver: Secondary | ICD-10-CM

## 2017-03-18 ENCOUNTER — Ambulatory Visit
Admission: RE | Admit: 2017-03-18 | Discharge: 2017-03-18 | Disposition: A | Discharge status: Home / Self Care | Payer: BC Managed Care – PPO | Source: Ambulatory Visit | Attending: Family Medicine | Admitting: Family Medicine

## 2017-03-18 DIAGNOSIS — K7689 Other specified diseases of liver: Secondary | ICD-10-CM

## 2017-03-18 MED ORDER — GADOBENATE DIMEGLUMINE 529 MG/ML IV SOLN
17.0000 mL | Freq: Once | INTRAVENOUS | Status: AC | PRN
  Administered 2017-03-18: 17 mL via INTRAVENOUS

## 2017-03-19 ENCOUNTER — Ambulatory Visit (HOSPITAL_COMMUNITY): Payer: BC Managed Care – PPO

## 2017-03-19 ENCOUNTER — Encounter (HOSPITAL_COMMUNITY)
Admission: RE | Disposition: A | Discharge status: Home / Self Care | Payer: Self-pay | Source: Ambulatory Visit | Attending: Urology

## 2017-03-19 ENCOUNTER — Encounter (HOSPITAL_COMMUNITY): Payer: Self-pay | Admitting: *Deleted

## 2017-03-19 ENCOUNTER — Ambulatory Visit (HOSPITAL_COMMUNITY)
Admission: RE | Admit: 2017-03-19 | Discharge: 2017-03-19 | Disposition: A | Discharge status: Home / Self Care | Payer: BC Managed Care – PPO | Source: Ambulatory Visit | Attending: Urology | Admitting: Urology

## 2017-03-19 DIAGNOSIS — Z87442 Personal history of urinary calculi: Secondary | ICD-10-CM | POA: Diagnosis not present

## 2017-03-19 DIAGNOSIS — K219 Gastro-esophageal reflux disease without esophagitis: Secondary | ICD-10-CM | POA: Insufficient documentation

## 2017-03-19 DIAGNOSIS — Z79891 Long term (current) use of opiate analgesic: Secondary | ICD-10-CM | POA: Diagnosis not present

## 2017-03-19 DIAGNOSIS — Z79899 Other long term (current) drug therapy: Secondary | ICD-10-CM | POA: Diagnosis not present

## 2017-03-19 DIAGNOSIS — E7800 Pure hypercholesterolemia, unspecified: Secondary | ICD-10-CM | POA: Diagnosis not present

## 2017-03-19 DIAGNOSIS — N201 Calculus of ureter: Secondary | ICD-10-CM | POA: Diagnosis present

## 2017-03-19 HISTORY — DX: Other specified diseases of liver: K76.89

## 2017-03-19 HISTORY — PX: EXTRACORPOREAL SHOCK WAVE LITHOTRIPSY: SHX1557

## 2017-03-19 SURGERY — LITHOTRIPSY, ESWL
Anesthesia: LOCAL | Laterality: Left

## 2017-03-19 MED ORDER — SODIUM CHLORIDE 0.9 % IV SOLN
INTRAVENOUS | Status: DC
  Administered 2017-03-19: 09:00:00 via INTRAVENOUS

## 2017-03-19 MED ORDER — OXYCODONE HCL 5 MG PO TABS
5.0000 mg | ORAL_TABLET | ORAL | Status: DC | PRN
  Administered 2017-03-19: 5 mg via ORAL
  Filled 2017-03-19: qty 1

## 2017-03-19 MED ORDER — DIAZEPAM 5 MG PO TABS
10.0000 mg | ORAL_TABLET | ORAL | Status: AC
  Administered 2017-03-19: 10 mg via ORAL
  Filled 2017-03-19: qty 2

## 2017-03-19 MED ORDER — CIPROFLOXACIN HCL 500 MG PO TABS
500.0000 mg | ORAL_TABLET | ORAL | Status: AC
  Administered 2017-03-19: 500 mg via ORAL
  Filled 2017-03-19: qty 1

## 2017-03-19 MED ORDER — DIPHENHYDRAMINE HCL 25 MG PO CAPS
25.0000 mg | ORAL_CAPSULE | ORAL | Status: DC
  Filled 2017-03-19: qty 1

## 2017-03-19 NOTE — H&P (Signed)
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 He has been followed by Dr. Isabel Erickson. His last stone intervention appears to be in 2009 when he underwent ureteroscopy. He has previously required multiple ureteroscopic procedures as well as shockwave lithotripsy procedures. He has only spontaneously passed one stone out of a total of 6 or 7 episodes. He developed the acute onset of severe left-sided flank pain with radiation to his left lower quadrant. He has had some associated nausea but denies vomiting. His pain was severe causing him to present to the emergency room yesterday. A CT stone study was performed. Interestingly, the report stated that he did not have an obstructing ureteral stone. However, on independent review, it appears that he does indeed have an obstructing left proximal ureteral stone at the UVJ. This measures 511 Hounsfield units. It appears to measure approximately 5 mm. It is unclear why they did not read this as a ureteral stone. However, he does have a vascular calcification on the right side near his UPJ and it may have been that they thought this was also vascular calcification. However, I reviewed his CT scan from 2010 in the right side of vascular calcification is present while the left-sided calcification is not. This along with his persistent symptoms suggest that he does have a left proximal ureteral stone.     ALLERGIES: No Allergies    MEDICATIONS: Omeprazole 20 mg tablet, delayed release  Hydrocodone-Acetaminophen 5 mg-325 mg tablet  Rosuvastatin Calcium 20 mg tablet     GU PSH: Cysto Uretero Lithotripsy - 2009 ESWL - 2009      PSH Notes: Cystoscopy With Ureteroscopy With Lithotripsy, Primary Repair Of Knee Ligament Cruciate Anterior, Lithotripsy   NON-GU PSH: Repair Knee Ligament - 2009    GU PMH: Elevated PSA, Elevated prostate specific antigen (PSA) - 2014 Renal calculus,  Nephrolithiasis - 2014 Renal colic, Renal colic - 2014 Ureteral calculus, Calculus of ureter - 2014      PMH Notes:  1898-07-24 00:00:00 - Note: Normal Routine History And Physical Adult   NON-GU PMH: Personal history of other diseases of the digestive system, History of esophageal reflux - 2014 Personal history of other endocrine, nutritional and metabolic disease, History of hypercholesterolemia - 2014 GERD Hypercholesterolemia    FAMILY HISTORY: 2 daughters - Daughter 1 son - Son Chronic Obstructive Pulmonary Disease - Father Laryngeal Cancer - Brother Malignant Melanoma Of Upper Arm - Brother nephrolithiasis - Father   SOCIAL HISTORY: Marital Status: Married Preferred Language: English; Ethnicity: Not Hispanic Or Latino; Race: White Current Smoking Status: Patient has never smoked.   Tobacco Use Assessment Completed: Used Tobacco in last 30 days? Drinks 2 drinks per day. Types of alcohol consumed: Beer.      Notes: Occupation:, Alcohol Use, Marital History - Currently Married, Caffeine Use, Tobacco Use   REVIEW OF SYSTEMS:    GU Review Male:   Patient reports get up at night to urinate. Patient denies frequent urination, hard to postpone urination, burning/ pain with urination, leakage of urine, stream starts and stops, trouble starting your streams, and have to strain to urinate .  Gastrointestinal (Lower):   Patient denies diarrhea and constipation.  Gastrointestinal (Upper):   Patient reports vomiting and nausea.   Constitutional:   Patient denies fever, night sweats, weight loss, and fatigue.  Skin:   Patient denies skin rash/ lesion and itching.  Eyes:   Patient denies blurred vision and double  vision.  Ears/ Nose/ Throat:   Patient denies sore throat and sinus problems.  Hematologic/Lymphatic:   Patient denies swollen glands and easy bruising.  Cardiovascular:   Patient denies leg swelling and chest pains.  Respiratory:   Patient denies cough and shortness of breath.   Endocrine:   Patient denies excessive thirst.  Musculoskeletal:   Patient reports back pain and joint pain.   Neurological:   Patient denies headaches and dizziness.  Psychologic:   Patient denies depression and anxiety.   VITAL SIGNS:      03/14/2017 03:17 PM  Weight 188 lb / 85.28 kg  Height 70 in / 177.8 cm  BP 95/61 mmHg  Pulse 64 /min  BMI 27.0 kg/m   MULTI-SYSTEM PHYSICAL EXAMINATION:    Constitutional: Well-nourished. No physical deformities. Normally developed. Good grooming.  Neck: Neck symmetrical, not swollen. Normal tracheal position.  Respiratory: No labored breathing, no use of accessory muscles. Clear bilaterally.  Cardiovascular: Normal temperature, normal extremity pulses, no swelling, no varicosities. Regular rate and rhythm.  Lymphatic: No enlargement of neck, axillae, groin.  Skin: No paleness, no jaundice, no cyanosis. No lesion, no ulcer, no rash.  Neurologic / Psychiatric: Oriented to time, oriented to place, oriented to person. No depression, no anxiety, no agitation.  Gastrointestinal: No mass, no tenderness, no rigidity, non obese abdomen. No CVA tenderness.  Eyes: Normal conjunctivae. Normal eyelids.  Ears, Nose, Mouth, and Throat: Left ear no scars, no lesions, no masses. Right ear no scars, no lesions, no masses. Nose no scars, no lesions, no masses. Normal hearing. Normal lips.  Musculoskeletal: Normal gait and station of head and neck.     PAST DATA REVIEWED:  Source Of History:  Patient  Records Review:   Previous Patient Records  X-Ray Review: KUB: Reviewed Films. I independently reviewed his KUB x-ray. This demonstrates a radio opaque 5 mm stone in the vicinity of the left UPJ. C.T. Abdomen/Pelvis: Reviewed Films.     PROCEDURES:         KUB - [id number]  A single view of the abdomen is obtained.               Urinalysis w/Scope Dipstick Dipstick Cont'd Micro  Color: Yellow Bilirubin: Neg WBC/hpf: 0 - 5/hpf  Appearance: Clear Ketones: Neg  RBC/hpf: NS (Not Seen)  Specific Gravity: 1.025 Blood: 1+ Bacteria: NS (Not Seen)  pH: 5.5 Protein: Neg Cystals: NS (Not Seen)  Glucose: Neg Urobilinogen: 0.2 Casts: NS (Not Seen)    Nitrites: Neg Trichomonas: Not Present    Leukocyte Esterase: Neg Mucous: Not Present      Epithelial Cells: 0 - 5/hpf      Yeast: NS (Not Seen)      Sperm: Not Present    ASSESSMENT:      ICD-10 Details  1 GU:   Ureteral calculus - N20.1 Stable   PLAN:            Medications New Meds: Hydrocodone-Acetaminophen 5 mg-325 mg tablet 1-2 tablet PO Q 6 H prn   #20  0 Refill(s)  Tamsulosin Hcl 0.4 mg capsule, ext release 24 hr 1 capsule PO Q HS   #30  0 Refill(s)            Orders X-Rays: KUB          Schedule Return Visit/Planned Activity: Other See Visit Notes             Note: Will call to schedule surgery  Document Letter(s):  Created for Patient: Clinical Summary         Notes:   1. Left ureteral calculus: We reviewed his KUB x-ray. We discussed treatment options and based on his history of not passing stones, he does wish to proceed with treatment. After reviewing options, he is most interested in shockwave lithotripsy. We have reviewed the potential risks, complications, and the expected recovery process. He gives informed consent to proceed. This will be scheduled for next week. He has been provided appropriate pain medication and will call should he develop fever, uncontrolled pain, or persistent nausea/vomiting.

## 2017-03-19 NOTE — Progress Notes (Signed)
RN called Billy Erickson [employer] speaking with Billy Kobus, RN with patients concerns of taking Benadryl.  Patient states when he takes "cold medicine" he becomes anxious, jittery and due to enlarged prostate he is unable to urinate.  RN to hold benadryl.

## 2017-03-19 NOTE — Op Note (Signed)
The stone had traveled down to the patients UVJ.  Given IV contrast prior to the procedure which demonstrated hydronephro-ureterosis to the UVJ.  We spoke about whether he wanted to continue with the procedure or try and pass the stone the remainder of the way - he opted to proceed.  See [employer] OP note scanned into chart. Also because of the size, density, location and other factors that cannot be anticipated I feel this will likely be a staged procedure. This fact supersedes any indication in the scanned [place] stone operative note to the contrary.

## 2017-03-19 NOTE — Discharge Instructions (Signed)
See [facility] discharge instructions in chart.  

## 2017-07-03 LAB — HM COLONOSCOPY

## 2017-09-20 ENCOUNTER — Ambulatory Visit: Payer: Self-pay | Admitting: Podiatry

## 2017-09-28 ENCOUNTER — Ambulatory Visit: Payer: BC Managed Care – PPO | Admitting: Podiatry

## 2017-09-28 ENCOUNTER — Other Ambulatory Visit: Payer: Self-pay | Admitting: Podiatry

## 2017-09-28 ENCOUNTER — Ambulatory Visit (INDEPENDENT_AMBULATORY_CARE_PROVIDER_SITE_OTHER): Payer: BC Managed Care – PPO

## 2017-09-28 VITALS — BP 131/82 | HR 97 | Ht 70.0 in | Wt 190.0 lb

## 2017-09-28 DIAGNOSIS — M659 Synovitis and tenosynovitis, unspecified: Secondary | ICD-10-CM

## 2017-09-28 DIAGNOSIS — M792 Neuralgia and neuritis, unspecified: Secondary | ICD-10-CM

## 2017-09-28 DIAGNOSIS — M79671 Pain in right foot: Secondary | ICD-10-CM | POA: Diagnosis not present

## 2017-09-28 DIAGNOSIS — M25571 Pain in right ankle and joints of right foot: Secondary | ICD-10-CM | POA: Diagnosis not present

## 2017-09-28 DIAGNOSIS — M779 Enthesopathy, unspecified: Secondary | ICD-10-CM

## 2017-09-28 NOTE — Progress Notes (Signed)
   Subjective:    Patient ID: Billy Erickson, male    DOB: 04-27-1946, 72 y.o.   MRN: [id number]  HPI  Chief Complaint  Patient presents with  . Foot Pain    Right - completely random very sharp pain outside edge of foot just below the ankle jointPatient has a long history of degenerative disc disease and is managed by [facility].        Review of Systems  Musculoskeletal: Positive for arthralgias, back pain, gait problem and myalgias.  All other systems reviewed and are negative.      Objective:   Physical Exam        Assessment & Plan:

## 2017-10-02 NOTE — Progress Notes (Signed)
Subjective:  Patient ID: Billy KocherWilliam N Erickson, male    DOB: Jan 18, 1946,  MRN: [id number]  Chief Complaint  Patient presents with  . Foot Pain    Right - completely random very sharp pain outside edge of foot just below the ankle jointPatient has a long history of degenerative disc disease and is managed by Murphy[facility].    72 y.o. male presents with the above complaint.  Reports pain to the outside to the right ankle just below the ankle joint.  States that the pain is random and very sharp.  Describes the pain as excruciating, focal, sporadic, like an ice pick, pain with both walking and sitting.  States that when he is resting ice does help with the pain.  Been present since at least December 1.  Denies known injury.  Past Medical History:  Diagnosis Date  . Dyspnea    with exercise only  . GERD (gastroesophageal reflux disease)   . History of kidney stones   . Hyperlipidemia   . Kidney stones   . Liver cyst   . Wrist pain, right    Past Surgical History:  Procedure Laterality Date  . ANTERIOR CRUCIATE LIGAMENT REPAIR    . CYSTOSCOPY/RETROGRADE/URETEROSCOPY  02/13/2008   basketing of fragments  . EXTRACORPOREAL SHOCK WAVE LITHOTRIPSY Left 03/19/2017   Procedure: LEFT EXTRACORPOREAL SHOCK WAVE LITHOTRIPSY (ESWL);  Surgeon: Crist FatHerrick, Benjamin W, MD;  Location: [facility];  Service: Urology;  Laterality: Left;  . KNEE ARTHROSCOPY WITH MEDIAL MENISECTOMY Left 08/18/2016   Procedure: LEFT KNEE ARTHROSCOPY, CHONDROPLASTY AND PARTIAL MEDIAL AND LATERAL MENISCECTOMY;  Surgeon: Sheral Apleyimothy D Murphy, MD;  Location: [facility];  Service: Orthopedics;  Laterality: Left;  . KNEE SURGERY Right   . MRI    . nuclear stress test    . WRIST ARTHROSCOPY WITH ULNA SHORTENING Right 07/06/2015   Procedure: RIGHT WRIST WITH  ARTHROSCOPIC DEBRIDEMENT AND ULNAR SHORTENING OSTEOTOMY;  Surgeon: Mack Hookavid Thompson, MD;  Location: [facility];  Service: Orthopedics;  Laterality: Right;     Current Outpatient Medications:  .  aspirin EC 81 MG tablet, Take 1 tablet (81 mg total) by mouth daily., Disp: 30 tablet, Rfl: 0 .  baclofen (LIORESAL) 10 MG tablet, Take 1 tablet (10 mg total) by mouth 3 (three) times daily as needed for muscle spasms. (Patient not taking: Reported on 03/13/2017), Disp: 40 each, Rfl: 0 .  diazepam (VALIUM) 5 MG tablet, TAKE 1 TABLET BY MOUTH 1 HR BEFORE PROCEDURE REPEAT IF NEEDED, Disp: , Rfl: 0 .  docusate sodium (COLACE) 100 MG capsule, Take 1 capsule (100 mg total) by mouth 2 (two) times daily. To prevent constipation while taking pain medication. (Patient not taking: Reported on 03/13/2017), Disp: 60 capsule, Rfl: 0 .  gabapentin (NEURONTIN) 300 MG capsule, TAKE 1 CAPSULE BY MOUTH EVERYDAY AT BEDTIME, Disp: , Rfl: 1 .  HYDROcodone-acetaminophen (NORCO/VICODIN) 5-325 MG tablet, Take 1-2 tablets by mouth every 4 (four) hours as needed., Disp: 15 tablet, Rfl: 0 .  ibuprofen (ADVIL,MOTRIN) 600 MG tablet, Take 600 mg by mouth every 6 (six) hours as needed for moderate pain. , Disp: , Rfl:  .  ketorolac (TORADOL) 10 MG tablet, Take 10 mg by mouth every 6 (six) hours as needed., Disp: , Rfl: 0 .  methocarbamol (ROBAXIN) 500 MG tablet, TAKE 1 TABLET BY MOUTH TWICE A DAY AS NEEDED FOR MUSCLE SPASM, Disp: , Rfl: 0 .  Multiple Vitamin (MULTIVITAMIN WITH MINERALS) TABS tablet, Take 1 tablet by mouth daily., Disp: ,  Rfl:  .  MYRBETRIQ 50 MG TB24 tablet, Take 50 mg by mouth daily., Disp: , Rfl: 11 .  omeprazole (PRILOSEC) 20 MG capsule, Take 20 mg by mouth daily., Disp: , Rfl:  .  ondansetron (ZOFRAN) 4 MG tablet, Take 1 tablet (4 mg total) by mouth every 8 (eight) hours as needed for nausea or vomiting., Disp: 40 tablet, Rfl: 0 .  oxyCODONE-acetaminophen (ROXICET) 5-325 MG tablet, Take 1-2 tablets by mouth every 4 (four) hours as needed for severe pain. (Patient not taking: Reported on 03/13/2017), Disp: 40 tablet, Rfl: 0 .  predniSONE (STERAPRED UNI-PAK 21 TAB) 10 MG  (21) TBPK tablet, TAKE 1 PACK AS DIRECTED, Disp: , Rfl: 0 .  rosuvastatin (CRESTOR) 10 MG tablet, Take 10 mg by mouth at bedtime. , Disp: , Rfl:  .  simvastatin (ZOCOR) 40 MG tablet, every evening., Disp: , Rfl: 1 .  tamsulosin (FLOMAX) 0.4 MG CAPS capsule, TAKE 1 CAPSULE BY MOUTH EVERYDAY AT BEDTIME, Disp: , Rfl: 0 .  tiZANidine (ZANAFLEX) 4 MG tablet, TAKE 1/2 - 1 TABELTS BY MOUTH AT BEDTIME AS NEEDED FOR SPASM, Disp: , Rfl: 1 .  zolpidem (AMBIEN CR) 6.25 MG CR tablet, 1 TABLET AT BEDTIME AS NEEDED ONCE A DAY (MUST LAST 30 DAYS) ORALLY 30, Disp: , Rfl: 0 .  zolpidem (AMBIEN) 5 MG tablet, Take 1 tablet (5 mg total) by mouth at bedtime as needed for sleep., Disp: 30 tablet, Rfl: 0  Allergies  Allergen Reactions  . Sudafed [Pseudoephedrine] Anxiety and Other (See Comments)    Anxious, unable to urinate,"jittery"   Review of Systems Objective:   Vitals:   09/28/17 0928  BP: 131/82  Pulse: 97   General AA&O x3. Normal mood and affect.  Vascular Dorsalis pedis and posterior tibial pulses  present 2+ bilaterally  Capillary refill normal to all digits. Pedal hair growth normal.  Neurologic Epicritic sensation grossly present.  Dermatologic No open lesions. Interspaces clear of maceration. Nails well groomed and normal in appearance.  Orthopedic: MMT 5/5 in dorsiflexion, plantarflexion, inversion, and eversion. Normal joint ROM without pain or crepitus. 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  Crepitus on heel inversion eversion right    Assessment & Plan:  Patient was evaluated and treated and all questions answered.  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  Diagnostic and therapeutic injection delivered to the right STJ.  We will follow-up in 3 weeks for results of the injection.  Would also consider  nerve conduction studies at next visit due to patient's long history of degenerative disc disease  Procedure: Injection Intermediate Joint Consent: Verbal consent obrained. Location: Right subtalar joint Skin Prep: Alcohol. Injectate: 1 cc 0.5% marcaine plain, 1 cc dexamethasone phosphate, 0.5 cc kenalog 10. Disposition: Patient tolerated procedure well. Injection site dressed with a band-aid.  Return in about 3 weeks (around 10/19/2017) for Sinus Tarsitis.

## 2017-10-12 ENCOUNTER — Ambulatory Visit: Payer: BC Managed Care – PPO | Admitting: Podiatry

## 2017-10-12 DIAGNOSIS — M722 Plantar fascial fibromatosis: Secondary | ICD-10-CM | POA: Diagnosis not present

## 2017-10-12 DIAGNOSIS — M79672 Pain in left foot: Secondary | ICD-10-CM

## 2017-10-12 DIAGNOSIS — M25571 Pain in right ankle and joints of right foot: Secondary | ICD-10-CM

## 2017-10-12 DIAGNOSIS — M79671 Pain in right foot: Secondary | ICD-10-CM

## 2017-10-12 NOTE — Patient Instructions (Signed)

## 2017-10-16 NOTE — Progress Notes (Signed)
  Subjective:  Patient ID: Rene KocherWilliam N Demma, male    DOB: 27-Dec-1945,  MRN: [id number]  Chief Complaint  Patient presents with  . Foot Pain    2 week follow up foot pain right - right foot is better, left foot nw has some pain  . Gout   72 y.o. male returns for the above complaint.  States the right foot is feeling better with fewer episodes of sharp pain.  States that the left arch is now starting to hurt him.  Objective:  There were no vitals filed for this visit. General AA&O x3. Normal mood and affect.  Vascular Pedal pulses palpable.  Neurologic Epicritic sensation grossly intact.  Dermatologic No open lesions. Skin normal texture and turgor.  Orthopedic:  Pain palpation about the left medial heel tuber   Assessment & Plan:  Patient was evaluated and treated and all questions answered.   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  Would consider reinjection in the future  Plantar Fasciitis, left - Educated on icing and stretching. Instructions given.  - Injection delivered to the plantar fascia as below.  Procedure: Injection Tendon/Ligament Location: Left plantar fascia at the glabrous junction; medial approach. Skin Prep: Alcohol. Injectate: 1 cc 0.5% marcaine plain, 1 cc dexamethasone phosphate, 0.5 cc kenalog 10. Disposition: Patient tolerated procedure well. Injection site dressed with a band-aid.     -  Return in about 3 weeks (around 11/02/2017) for Plantar fasciitis.

## 2017-11-01 ENCOUNTER — Ambulatory Visit: Payer: BC Managed Care – PPO | Admitting: Podiatry

## 2017-11-01 DIAGNOSIS — M722 Plantar fascial fibromatosis: Secondary | ICD-10-CM | POA: Diagnosis not present

## 2017-11-01 DIAGNOSIS — M79672 Pain in left foot: Secondary | ICD-10-CM | POA: Diagnosis not present

## 2017-11-01 DIAGNOSIS — M25571 Pain in right ankle and joints of right foot: Secondary | ICD-10-CM | POA: Diagnosis not present

## 2017-11-02 ENCOUNTER — Ambulatory Visit: Payer: BC Managed Care – PPO | Admitting: Podiatry

## 2017-11-08 ENCOUNTER — Other Ambulatory Visit: Payer: BC Managed Care – PPO | Admitting: Orthotics

## 2017-11-19 ENCOUNTER — Ambulatory Visit (INDEPENDENT_AMBULATORY_CARE_PROVIDER_SITE_OTHER): Payer: BC Managed Care – PPO | Admitting: Orthotics

## 2017-11-19 DIAGNOSIS — M722 Plantar fascial fibromatosis: Secondary | ICD-10-CM | POA: Diagnosis not present

## 2017-11-19 DIAGNOSIS — M25571 Pain in right ankle and joints of right foot: Secondary | ICD-10-CM

## 2017-11-19 NOTE — Progress Notes (Signed)
Patient came into today to be cast for Custom Foot Orthotics. Upon recommendation of Dr. Samuella Cota Patient presents with pes cavus foot, sinus tarsi (R), hx of PF (L) Goals are arch support, valgus wedge Plan vendor [employer]

## 2017-11-20 NOTE — Progress Notes (Signed)
  Subjective:  Patient ID: Billy Erickson, male    DOB: November 01, 1945,  MRN: [id number]  Chief Complaint  Patient presents with  . Plantar Fasciitis    right foot doing good/ left foot still really hurts   72 y.o. male returns for the above complaint.  States that the right foot is feeling much better but the left is trying to hurt him.   Objective:  There were no vitals filed for this visit. General AA&O x3. Normal mood and affect.  Vascular Pedal pulses palpable.  Neurologic Epicritic sensation grossly intact.  Dermatologic No open lesions. Skin normal texture and turgor.  Orthopedic: Pain palpation about the left medial heel tuber  Pain palpation about the right sinus tarsi   Assessment & Plan:  Patient was evaluated and treated and all questions answered.   Sinus tarsitis -Repeat injection today  Procedure: Joint Injection Location: Right STJ joint Skin Prep: Alcohol. Injectate: 0.5 cc 1% lidocaine plain, 1 cc dexamethasone phosphate, 0.5 cc Kenalog 10 Disposition: Patient tolerated procedure well. Injection site dressed with a band-aid.  Plantar Fasciitis, left -Repeat injection as below.  Procedure: Injection Tendon/Ligament Consent: Verbal consent obtained. Location: Left plantar fascia at the glabrous junction; medial approach. Skin Prep: Alcohol. Injectate: 1 cc 0.5% marcaine plain, 1 cc dexamethasone phosphate, 0.5 cc kenalog 10. Disposition: Patient tolerated procedure well. Injection site dressed with a band-aid.  Return in about 3 weeks (around 11/22/2017) for Plantar fasciitis.

## 2017-11-22 ENCOUNTER — Ambulatory Visit: Payer: BC Managed Care – PPO | Admitting: Podiatry

## 2017-12-10 ENCOUNTER — Other Ambulatory Visit: Payer: Self-pay | Admitting: Neurosurgery

## 2017-12-10 ENCOUNTER — Ambulatory Visit: Payer: BC Managed Care – PPO | Admitting: Orthotics

## 2017-12-10 DIAGNOSIS — M25571 Pain in right ankle and joints of right foot: Secondary | ICD-10-CM

## 2017-12-10 DIAGNOSIS — M722 Plantar fascial fibromatosis: Secondary | ICD-10-CM

## 2017-12-10 NOTE — Progress Notes (Signed)
Patient came in today to pick up custom made foot orthotics.  The goals were accomplished and the patient reported no dissatisfaction with said orthotics.  Patient was advised of breakin period and how to report any issues. 

## 2017-12-20 ENCOUNTER — Other Ambulatory Visit: Payer: Self-pay | Admitting: Neurosurgery

## 2017-12-26 ENCOUNTER — Telehealth: Payer: Self-pay | Admitting: Podiatry

## 2017-12-26 DIAGNOSIS — M25571 Pain in right ankle and joints of right foot: Secondary | ICD-10-CM

## 2017-12-26 DIAGNOSIS — M722 Plantar fascial fibromatosis: Secondary | ICD-10-CM | POA: Diagnosis not present

## 2017-12-26 NOTE — Telephone Encounter (Signed)
Pt called and left message stating the orthotics are working  and he would like to order a second pair.  I returned call and pt stated they are working great and he is wanting a second pair and he has [employer] state so we will [employer] insurance. I told pt they should be ordered tomorrow and I will call pt when they arrive to pick them up.

## 2017-12-27 ENCOUNTER — Other Ambulatory Visit (HOSPITAL_COMMUNITY): Payer: Self-pay | Admitting: Neurosurgery

## 2017-12-27 DIAGNOSIS — M5136 Other intervertebral disc degeneration, lumbar region: Secondary | ICD-10-CM

## 2017-12-27 DIAGNOSIS — M51369 Other intervertebral disc degeneration, lumbar region without mention of lumbar back pain or lower extremity pain: Secondary | ICD-10-CM

## 2017-12-28 NOTE — Pre-Procedure Instructions (Signed)
Billy KocherWilliam N Erickson  12/28/2017      [id number] - [place], [place] - [address] AT [facility] [address] [place] [address] Phone: [phone] Fax: [phone]  [facility] [place], [place] - [id number] [place] [id number] Phone: [phone] Fax: [phone]  [facility] [place], [place] - [address] [place] [address] Phone: [phone] Fax: [phone]    Your procedure is scheduled on June 18  [facility] Admitting at [phone].  Call this number if you have problems the morning of surgery:  [phone]   Remember:  No food  Or drink after midnight.      Take these medicines the morning of surgery with A SIP OF WATER Diazepam (Valium) Hydrocodone (Norco) or Oxycodone (percocet)  if needed Ketorolac (Toradol) Myrbetriq Omeprazole (Prilosec)    Stop taking aspirin as directed by your Dr.  Stop taking BC's, Goody's, Herbal medications, Fish Oil, Aleve, Ibuprofen, Advil, Motrin     Do not wear jewelry, make-up or nail polish.  Do not wear lotions, powders, or perfumes, or deodorant.  Do not shave 48 hours prior to surgery.  Men may shave face and neck.  Do not bring valuables to the hospital.  [facility] is not responsible for any belongings or valuables.  Contacts, dentures or bridgework may not be worn into surgery.  Leave your suitcase in the car.  After surgery it may be brought to your room.  For patients admitted to the hospital, discharge time will be determined by your treatment team.  Patients discharged the day of surgery will not be allowed to drive home.    Special instructions:   [facility]- Preparing For Surgery  Before surgery, you can play an important role. Because skin is not sterile, your skin needs to be as free of germs as possible. You can reduce the number of germs on your skin by washing with  CHG (chlorahexidine gluconate) Soap before surgery.  CHG is an antiseptic cleaner which kills germs and bonds with the skin to continue killing germs even after washing.    Oral Hygiene is also important to reduce your risk of infection.  Remember - BRUSH YOUR TEETH THE MORNING OF SURGERY WITH YOUR REGULAR TOOTHPASTE  Please do not use if you have an allergy to CHG or antibacterial soaps. If your skin becomes reddened/irritated stop using the CHG.  Do not shave (including legs and underarms) for at least 48 hours prior to first CHG shower. It is OK to shave your face.  Please follow these instructions carefully.   1. Shower the NIGHT BEFORE SURGERY and the MORNING OF SURGERY with CHG.   2. If you chose to wash your hair, wash your hair first as usual with your normal shampoo.  3. After you shampoo, rinse your hair and body thoroughly to remove the shampoo.  4. Use CHG as you would any other liquid soap. You can apply CHG directly to the skin and wash gently with a scrungie or a clean washcloth.   5. Apply the CHG Soap to your body ONLY FROM THE NECK DOWN.  Do not use on open wounds or open sores. Avoid contact with your eyes, ears, mouth and genitals (private parts). Wash Face and genitals (private parts)  with your normal soap.  6. Wash thoroughly, paying special attention to  the area where your surgery will be performed.  7. Thoroughly rinse your body with warm water from the neck down.  8. DO NOT shower/wash with your normal soap after using and rinsing off the CHG Soap.  9. Pat yourself dry with a CLEAN TOWEL.  10. Wear CLEAN PAJAMAS to bed the night before surgery, wear comfortable clothes the morning of surgery  11. Place CLEAN SHEETS on your bed the night of your first shower and DO NOT SLEEP WITH PETS.    Day of Surgery:  Do not apply any deodorants/lotions.  Please wear clean clothes to the hospital/surgery center.   Remember to brush your teeth WITH YOUR REGULAR  TOOTHPASTE.    Please read over the following fact sheets that you were given. Pain Booklet, Coughing and Deep Breathing, MRSA Information and Surgical Site Infection Prevention

## 2017-12-31 ENCOUNTER — Other Ambulatory Visit (HOSPITAL_COMMUNITY): Payer: BC Managed Care – PPO

## 2017-12-31 ENCOUNTER — Other Ambulatory Visit: Payer: Self-pay

## 2017-12-31 ENCOUNTER — Encounter (HOSPITAL_COMMUNITY): Payer: Self-pay

## 2017-12-31 ENCOUNTER — Encounter (HOSPITAL_COMMUNITY)
Admission: RE | Admit: 2017-12-31 | Discharge: 2017-12-31 | Disposition: A | Discharge status: Home / Self Care | Payer: BC Managed Care – PPO | Source: Ambulatory Visit | Attending: Neurosurgery | Admitting: Neurosurgery

## 2017-12-31 DIAGNOSIS — E785 Hyperlipidemia, unspecified: Secondary | ICD-10-CM | POA: Insufficient documentation

## 2017-12-31 DIAGNOSIS — Z01812 Encounter for preprocedural laboratory examination: Secondary | ICD-10-CM | POA: Diagnosis present

## 2017-12-31 DIAGNOSIS — M5136 Other intervertebral disc degeneration, lumbar region: Secondary | ICD-10-CM | POA: Diagnosis not present

## 2017-12-31 DIAGNOSIS — K219 Gastro-esophageal reflux disease without esophagitis: Secondary | ICD-10-CM | POA: Diagnosis not present

## 2017-12-31 DIAGNOSIS — Z79899 Other long term (current) drug therapy: Secondary | ICD-10-CM | POA: Diagnosis not present

## 2017-12-31 DIAGNOSIS — Z01810 Encounter for preprocedural cardiovascular examination: Secondary | ICD-10-CM | POA: Insufficient documentation

## 2017-12-31 DIAGNOSIS — Z7982 Long term (current) use of aspirin: Secondary | ICD-10-CM | POA: Diagnosis not present

## 2017-12-31 HISTORY — DX: Nonrheumatic aortic (valve) insufficiency: I35.1

## 2017-12-31 HISTORY — DX: Benign prostatic hyperplasia without lower urinary tract symptoms: N40.0

## 2017-12-31 HISTORY — DX: Unspecified osteoarthritis, unspecified site: M19.90

## 2017-12-31 LAB — BASIC METABOLIC PANEL
Anion gap: 7 (ref 5–15)
BUN: 20 mg/dL (ref 6–20)
CALCIUM: 9.7 mg/dL (ref 8.9–10.3)
CO2: 26 mmol/L (ref 22–32)
Chloride: 109 mmol/L (ref 101–111)
Creatinine, Ser: 1.06 mg/dL (ref 0.61–1.24)
GFR calc Af Amer: >60 mL/min (ref >60)
GLUCOSE: 108 mg/dL — AB (ref 65–99)
Potassium: 4.4 mmol/L (ref 3.5–5.1)
Sodium: 142 mmol/L (ref 135–145)

## 2017-12-31 LAB — CBC
HEMATOCRIT: 44.6 % (ref 39.0–52.0)
Hemoglobin: 14.9 g/dL (ref 13.0–17.0)
MCH: 31.1 pg (ref 26.0–34.0)
MCHC: 33.4 g/dL (ref 30.0–36.0)
MCV: 93.1 fL (ref 78.0–100.0)
PLATELETS: 159 10*3/uL (ref 150–400)
RBC: 4.79 MIL/uL (ref 4.22–5.81)
RDW: 11.9 % (ref 11.5–15.5)
WBC: 4.9 10*3/uL (ref 4.0–10.5)

## 2017-12-31 LAB — ABO/RH: ABO/RH(D): O POS

## 2017-12-31 LAB — SURGICAL PCR SCREEN
MRSA, PCR: NEGATIVE
Staphylococcus aureus: NEGATIVE

## 2017-12-31 LAB — TYPE AND SCREEN
ABO/RH(D): O POS
Antibody Screen: NEGATIVE

## 2017-12-31 NOTE — Progress Notes (Addendum)
PCP is Dr. Susa SimmondsVia Deboraha Sprang([facility])  LOV, he thinks 2016 or 2018 Did go see Dr. York SpanielS Tilley back in 2017   Was having some DOE, had stress test - results came back "neg".  Not cardiac in nature.  Has seen him twice after - no further testing.

## 2017-12-31 NOTE — Pre-Procedure Instructions (Signed)
Billy Erickson  12/31/2017      [id number] - [place], [place] - [address] AT [facility] [address] [place] [address] Phone: [phone] Fax: [phone]  [facility] [place], [place] - [address] [facility] [place] [address] Phone: [phone] Fax: [phone]  [facility] - [place], [place] - [address] [place] [address] Phone: [phone] Fax: [phone]    Your procedure is scheduled on Tuesday, June 18   Report to [facility] Admitting at [phone].             (posted surgery time 10:18a - 5:48p)   Call this number if you have problems the morning of surgery:  [phone]   Remember:                         Nothing to eat nor drink after midnight, Monday.    However, take these medicines the morning of surgery with A SIP OF WATER Diazepam (Valium) Hydrocodone (Norco) or Oxycodone (percocet)  if needed Ketorolac (Toradol) Myrbetriq Omeprazole (Prilosec)    Stop taking aspirin as directed by your Dr.  Stop taking BC's, Goody's, Herbal medications, Fish Oil, Aleve, Ibuprofen, Advil, Motrin     Do not wear jewelry - no rings or watches.  Do not wear lotions, colognes or deodorant.   Men may shave face and neck.  Do not bring valuables to the hospital.  [facility] is not responsible for any belongings or valuables.  Contacts, dentures or bridgework may not be worn into surgery.  Leave your suitcase in the car.  After surgery it may be brought to your room.  For patients admitted to the hospital, discharge time will be determined by your treatment team.     Special instructions:   [place]- Preparing For Surgery  Before surgery, you can play an important role. Because skin is not sterile, your skin needs to be as free of germs as possible. You can reduce the number of germs on your skin by washing with CHG (chlorahexidine  gluconate) Soap before surgery.  CHG is an antiseptic cleaner which kills germs and bonds with the skin to continue killing germs even after washing.    Oral Hygiene is also important to reduce your risk of infection.  Remember - BRUSH YOUR TEETH THE MORNING OF SURGERY WITH YOUR REGULAR TOOTHPASTE  Please do not use if you have an allergy to CHG or antibacterial soaps. If your skin becomes reddened/irritated stop using the CHG.  Do not shave (including legs and underarms) for at least 48 hours prior to first CHG shower. It is OK to shave your face.  Please follow these instructions carefully.   1. Shower the NIGHT BEFORE SURGERY and the MORNING OF SURGERY with CHG.   2. If you chose to wash your hair, wash your hair first as usual with your normal shampoo.  3. After you shampoo, rinse your hair and body thoroughly to remove the shampoo.  4. Use CHG as you would any other liquid soap. You can apply CHG directly to the skin and wash gently with a scrungie or a clean washcloth.   5. Apply the CHG Soap to your body ONLY FROM THE NECK DOWN.  Do not use on open wounds or open sores. Avoid contact with your eyes, ears, mouth and  genitals (private parts). Wash Face and genitals (private parts)  with your normal soap.  6. Wash thoroughly, paying special attention to the area where your surgery will be performed.  7. Thoroughly rinse your body with warm water from the neck down.  8. DO NOT shower/wash with your normal soap after using and rinsing off the CHG Soap.  9. Pat yourself dry with a CLEAN TOWEL.  10. Wear CLEAN PAJAMAS to bed the night before surgery, wear comfortable clothes the morning of surgery  11. Place CLEAN SHEETS on your bed the night of your first shower and DO NOT SLEEP WITH PETS.    Day of Surgery:  Do not apply any deodorants/lotions.  Please wear clean clothes to the hospital/surgery center.   Remember to brush your teeth WITH YOUR REGULAR TOOTHPASTE.    Please  read over the following fact sheets that you were given. Pain Booklet, Coughing and Deep Breathing, MRSA Information and Surgical Site Infection Prevention

## 2018-01-01 ENCOUNTER — Ambulatory Visit (HOSPITAL_COMMUNITY)
Admission: RE | Admit: 2018-01-01 | Discharge: 2018-01-01 | Disposition: A | Discharge status: Home / Self Care | Payer: BC Managed Care – PPO | Source: Ambulatory Visit | Attending: Neurosurgery | Admitting: Neurosurgery

## 2018-01-01 ENCOUNTER — Encounter (HOSPITAL_COMMUNITY): Payer: Self-pay

## 2018-01-01 DIAGNOSIS — N200 Calculus of kidney: Secondary | ICD-10-CM | POA: Diagnosis not present

## 2018-01-01 DIAGNOSIS — M5116 Intervertebral disc disorders with radiculopathy, lumbar region: Secondary | ICD-10-CM | POA: Diagnosis not present

## 2018-01-01 DIAGNOSIS — M479 Spondylosis, unspecified: Secondary | ICD-10-CM | POA: Insufficient documentation

## 2018-01-01 DIAGNOSIS — M5136 Other intervertebral disc degeneration, lumbar region: Secondary | ICD-10-CM | POA: Diagnosis not present

## 2018-01-01 DIAGNOSIS — M48061 Spinal stenosis, lumbar region without neurogenic claudication: Secondary | ICD-10-CM | POA: Diagnosis not present

## 2018-01-01 NOTE — Progress Notes (Addendum)
Anesthesia Chart Review:  Case:  [id number] Date/Time:  01/08/18 1003   Procedures:      LATERAL INTERBODY FUSION LUMBAR 2- LUMBAR 3, LUMBAR 3- LUMBAR 4, LEFT LUMBAR 5-SACRAL 1 TRANSFORAMINAL LUMBAR INTERBODY FUSION (Right ) - LATERAL INTERBODY FUSION LUMBAR 2- LUMBAR 3, LUMBAR 3- LUMBAR 4, LEFT LUMBAR 5-SACRAL 1 TRANSFORAMINAL LUMBAR INTERBODY FUSION     LUMBAR PERCUTANEOUS PEDICLE SCREW 3 LEVEL (N/A ) - LUMBAR PERCUTANEOUS PEDICLE SCREW 3 LEVEL     APPLICATION OF ROBOTIC ASSISTANCE FOR SPINAL PROCEDURE (N/A ) - APPLICATION OF ROBOTIC ASSISTANCE FOR SPINAL PROCEDURE   Anesthesia type:  General   Pre-op diagnosis:  DEGENERATIVE DISC DISEASE   Location:  [facility] ROOM 21 / [facility]   Surgeon:  Lisbeth RenshawNundkumar, Neelesh, MD      DISCUSSION: Patient is a 72 year old male scheduled for the above procedure. History includes never smoker, GERD, HLD, BPH, liver and renal cysts (11/13/08 U/S), dyspnea on exertion ([facility]; non-ischemic Myoview, mild-moderate AR by echo).  He had a stress and echo within the past two years. No acute cardiopulmonary issues reported. If no acute changes then I would anticipate that he can proceed as planned.   VS: BP (!) 138/91   Pulse (!) 58   Temp 36.7 C   Resp 20   Ht 5\' 10"  (1.778 m)   Wt 202 lb 14.4 oz (92 kg)   SpO2 98%   BMI 29.11 kg/m   PROVIDERS: ViaCaryn Bee, Kevin, MD is PCP [facility]) He is not routinely followed by cardiology, but he was evaluated by Georga Hackingilley, W. Spencer, MD in 2017 for exertional dyspnea with testing as outlined below. Office confirmed that last visit was in 2017.    LABS: Labs reviewed: Acceptable for surgery. (all labs ordered are listed, but only abnormal results are displayed)  Labs Reviewed  BASIC METABOLIC PANEL - Abnormal; Notable for the following components:      Result Value   Glucose, Bld 108 (*)    All other components within normal limits  SURGICAL PCR SCREEN  CBC  TYPE AND SCREEN  ABO/RH     IMAGES: U/S abdominal 11/13/08: IMPRESSION: 1.  No gallstones.  No ductal dilatation. 2.  Multiple bilateral renal cysts. No solid renal lesion is seen. 3.  Multiple liver cysts. 4.  Fatty infiltration of the liver.   EKG: 12/31/17: SB at 55 bpm   CV: Nuclear stress test 07/13/16 (done due to abnormal ETT, [facility]; scanned under Media tab, Correspondence 03/19/17):  IMPRESSION: 1.  Normal stress Myoview study with no evidence of ischemia or infarction. 2.  Normal quantitative gated SPECT ejection fraction of 52% with normal wall motion and wall thickening. Recommendations: The patient has good exercise tolerance with no evidence of myocardial ischemia.  This current treadmill test did not show the ST depression that he previously had on the treadmill and he may be reassured at this time.  He should continue with significant risk factor modification try to lose additional weight.  Echo 05/29/16 [facility]; scanned under Media tab, Correspondence 03/19/17): Conclusion: 1.  Mild concentric LVH with normal global wall motion.  Estimated EF 60%. 2.  Left atrial cavity is mildly dilated. 3.  Trileaflet aortic valve with mild to moderate regurgitation. 4.  Trace mitral, tricuspid, and pulmonic regurgitation.   Past Medical History:  Diagnosis Date  . Arthritis   . Dyspnea    with exercise only  . Enlarged prostate   . GERD (  gastroesophageal reflux disease)   . History of kidney stones   . Hyperlipidemia   . Kidney stones   . Liver cyst   . Wrist pain, right     Past Surgical History:  Procedure Laterality Date  . ANTERIOR CRUCIATE LIGAMENT REPAIR    . CYSTOSCOPY/RETROGRADE/URETEROSCOPY  02/13/2008   basketing of fragments  . EXTRACORPOREAL SHOCK WAVE LITHOTRIPSY Left 03/19/2017   Procedure: LEFT EXTRACORPOREAL SHOCK WAVE LITHOTRIPSY (ESWL);  Surgeon: Crist Fat, MD;  Location[facility];  Service: Urology;  Laterality: Left;  . KNEE ARTHROSCOPY WITH  MEDIAL MENISECTOMY Left 08/18/2016   Procedure: LEFT KNEE ARTHROSCOPY, CHONDROPLASTY AND PARTIAL MEDIAL AND LATERAL MENISCECTOMY;  Surgeon: Sheral Apley, MD;  Location: [facility];  Service: Orthopedics;  Laterality: Left;  . KNEE SURGERY Right   . MRI    . nuclear stress test    . TONSILLECTOMY    . WRIST ARTHROSCOPY WITH ULNA SHORTENING Right 07/06/2015   Procedure: RIGHT WRIST WITH  ARTHROSCOPIC DEBRIDEMENT AND ULNAR SHORTENING OSTEOTOMY;  Surgeon: Mack Hook, MD;  Location: [facility];  Service: Orthopedics;  Laterality: Right;    MEDICATIONS: . aspirin EC 81 MG tablet  . baclofen (LIORESAL) 10 MG tablet  . gabapentin (NEURONTIN) 300 MG capsule  . HYDROcodone-acetaminophen (NORCO/VICODIN) 5-325 MG tablet  . ibuprofen (ADVIL,MOTRIN) 600 MG tablet  . ketorolac (TORADOL) 10 MG tablet  . Multiple Vitamin (MULTIVITAMIN WITH MINERALS) TABS tablet  . omeprazole (PRILOSEC) 20 MG capsule  . ondansetron (ZOFRAN) 4 MG tablet  . oxyCODONE-acetaminophen (ROXICET) 5-325 MG tablet  . simvastatin (ZOCOR) 40 MG tablet  . zolpidem (AMBIEN CR) 6.25 MG CR tablet  . zolpidem (AMBIEN) 5 MG tablet   No current facility-administered medications for this encounter.   He is to hold ASA as directed by surgeon. (Surgeon's office instructed him to stop on 01/01/18.)  Velna Ochs [facility]/Anesthesiology Phone [phone] 01/02/2018 11:50 AM

## 2018-01-01 NOTE — Telephone Encounter (Signed)
Perfect thanks

## 2018-01-08 ENCOUNTER — Inpatient Hospital Stay (HOSPITAL_COMMUNITY): Payer: BC Managed Care – PPO

## 2018-01-08 ENCOUNTER — Inpatient Hospital Stay (HOSPITAL_COMMUNITY)
Admission: RE | Disposition: A | Discharge status: Home / Self Care | Payer: Self-pay | Source: Home / Self Care | Attending: Neurosurgery

## 2018-01-08 ENCOUNTER — Inpatient Hospital Stay (HOSPITAL_COMMUNITY): Payer: BC Managed Care – PPO | Admitting: Vascular Surgery

## 2018-01-08 ENCOUNTER — Other Ambulatory Visit: Payer: Self-pay

## 2018-01-08 ENCOUNTER — Inpatient Hospital Stay (HOSPITAL_COMMUNITY)
Admission: RE | Admit: 2018-01-08 | Discharge: 2018-01-11 | DRG: 458 | Disposition: A | Discharge status: Home / Self Care | Payer: BC Managed Care – PPO | Attending: Neurosurgery | Admitting: Neurosurgery

## 2018-01-08 ENCOUNTER — Encounter (HOSPITAL_COMMUNITY): Payer: Self-pay | Admitting: *Deleted

## 2018-01-08 ENCOUNTER — Inpatient Hospital Stay (HOSPITAL_COMMUNITY): Payer: BC Managed Care – PPO | Admitting: Anesthesiology

## 2018-01-08 DIAGNOSIS — Z79899 Other long term (current) drug therapy: Secondary | ICD-10-CM | POA: Diagnosis not present

## 2018-01-08 DIAGNOSIS — M48061 Spinal stenosis, lumbar region without neurogenic claudication: Secondary | ICD-10-CM | POA: Diagnosis present

## 2018-01-08 DIAGNOSIS — M5416 Radiculopathy, lumbar region: Secondary | ICD-10-CM | POA: Diagnosis present

## 2018-01-08 DIAGNOSIS — M25552 Pain in left hip: Secondary | ICD-10-CM | POA: Diagnosis not present

## 2018-01-08 DIAGNOSIS — M4150 Other secondary scoliosis, site unspecified: Secondary | ICD-10-CM | POA: Diagnosis present

## 2018-01-08 DIAGNOSIS — K219 Gastro-esophageal reflux disease without esophagitis: Secondary | ICD-10-CM | POA: Diagnosis present

## 2018-01-08 DIAGNOSIS — I351 Nonrheumatic aortic (valve) insufficiency: Secondary | ICD-10-CM | POA: Diagnosis present

## 2018-01-08 DIAGNOSIS — Z888 Allergy status to other drugs, medicaments and biological substances status: Secondary | ICD-10-CM | POA: Diagnosis not present

## 2018-01-08 DIAGNOSIS — E785 Hyperlipidemia, unspecified: Secondary | ICD-10-CM | POA: Diagnosis present

## 2018-01-08 DIAGNOSIS — T50905A Adverse effect of unspecified drugs, medicaments and biological substances, initial encounter: Secondary | ICD-10-CM | POA: Diagnosis not present

## 2018-01-08 DIAGNOSIS — G4733 Obstructive sleep apnea (adult) (pediatric): Secondary | ICD-10-CM | POA: Diagnosis not present

## 2018-01-08 DIAGNOSIS — R0902 Hypoxemia: Secondary | ICD-10-CM | POA: Diagnosis not present

## 2018-01-08 DIAGNOSIS — Z419 Encounter for procedure for purposes other than remedying health state, unspecified: Secondary | ICD-10-CM

## 2018-01-08 DIAGNOSIS — M5136 Other intervertebral disc degeneration, lumbar region: Secondary | ICD-10-CM | POA: Diagnosis not present

## 2018-01-08 DIAGNOSIS — M4326 Fusion of spine, lumbar region: Secondary | ICD-10-CM | POA: Diagnosis not present

## 2018-01-08 DIAGNOSIS — M5137 Other intervertebral disc degeneration, lumbosacral region: Secondary | ICD-10-CM | POA: Diagnosis not present

## 2018-01-08 DIAGNOSIS — M4726 Other spondylosis with radiculopathy, lumbar region: Principal | ICD-10-CM | POA: Diagnosis present

## 2018-01-08 DIAGNOSIS — Z7982 Long term (current) use of aspirin: Secondary | ICD-10-CM

## 2018-01-08 HISTORY — PX: APPLICATION OF ROBOTIC ASSISTANCE FOR SPINAL PROCEDURE: SHX6753

## 2018-01-08 HISTORY — PX: ANTERIOR LAT LUMBAR FUSION: SHX1168

## 2018-01-08 HISTORY — PX: TRANSFORAMINAL LUMBAR INTERBODY FUSION (TLIF) WITH PEDICLE SCREW FIXATION 1 LEVEL: SHX6141

## 2018-01-08 HISTORY — PX: LUMBAR PERCUTANEOUS PEDICLE SCREW 3 LEVEL: SHX5562

## 2018-01-08 SURGERY — ANTERIOR LATERAL LUMBAR FUSION 2 LEVELS
Anesthesia: General | Site: Flank | Laterality: Right

## 2018-01-08 MED ORDER — ACETAMINOPHEN 650 MG RE SUPP: 650.0000 mg | RECTAL | Status: DC | PRN

## 2018-01-08 MED ORDER — DOCUSATE SODIUM 100 MG PO CAPS
100.0000 mg | ORAL_CAPSULE | Freq: Two times a day (BID) | ORAL | Status: DC
  Administered 2018-01-08 – 2018-01-11 (×6): 100 mg via ORAL
  Filled 2018-01-08 (×6): qty 1

## 2018-01-08 MED ORDER — SENNOSIDES-DOCUSATE SODIUM 8.6-50 MG PO TABS: 1.0000 | ORAL_TABLET | Freq: Every evening | ORAL | Status: DC | PRN

## 2018-01-08 MED ORDER — THROMBIN 5000 UNITS EX SOLR
CUTANEOUS | Status: AC
  Filled 2018-01-08: qty 5000

## 2018-01-08 MED ORDER — ONDANSETRON HCL 4 MG/2ML IJ SOLN
INTRAMUSCULAR | Status: AC
  Filled 2018-01-08: qty 2

## 2018-01-08 MED ORDER — OXYCODONE HCL 5 MG PO TABS: 5.0000 mg | ORAL_TABLET | Freq: Once | ORAL | Status: DC | PRN

## 2018-01-08 MED ORDER — SODIUM CHLORIDE 0.9 % IV SOLN
INTRAVENOUS | Status: DC | PRN
  Administered 2018-01-08: 13:00:00

## 2018-01-08 MED ORDER — LACTATED RINGERS IV SOLN
INTRAVENOUS | Status: DC
  Administered 2018-01-08 (×3): via INTRAVENOUS

## 2018-01-08 MED ORDER — FENTANYL CITRATE (PF) 100 MCG/2ML IJ SOLN
INTRAMUSCULAR | Status: AC
  Administered 2018-01-08: 50 ug via INTRAVENOUS
  Filled 2018-01-08: qty 2

## 2018-01-08 MED ORDER — FENTANYL CITRATE (PF) 100 MCG/2ML IJ SOLN: 25.0000 ug | INTRAMUSCULAR | Status: DC | PRN

## 2018-01-08 MED ORDER — OXYCODONE HCL 5 MG PO TABS
5.0000 mg | ORAL_TABLET | ORAL | Status: DC | PRN
  Administered 2018-01-08: 5 mg via ORAL
  Administered 2018-01-09 (×2): 10 mg via ORAL
  Administered 2018-01-09: 5 mg via ORAL
  Administered 2018-01-09 – 2018-01-10 (×4): 10 mg via ORAL
  Administered 2018-01-10 – 2018-01-11 (×3): 5 mg via ORAL
  Administered 2018-01-11: 10 mg via ORAL
  Filled 2018-01-08 (×3): qty 2
  Filled 2018-01-08 (×3): qty 1
  Filled 2018-01-08 (×3): qty 2
  Filled 2018-01-08 (×2): qty 1
  Filled 2018-01-08 (×2): qty 2

## 2018-01-08 MED ORDER — FLEET ENEMA 7-19 GM/118ML RE ENEM: 1.0000 | ENEMA | Freq: Once | RECTAL | Status: DC | PRN

## 2018-01-08 MED ORDER — CEFAZOLIN SODIUM-DEXTROSE 2-4 GM/100ML-% IV SOLN
2.0000 g | Freq: Three times a day (TID) | INTRAVENOUS | Status: AC
  Administered 2018-01-08 – 2018-01-09 (×2): 2 g via INTRAVENOUS
  Filled 2018-01-08 (×2): qty 100

## 2018-01-08 MED ORDER — SENNA 8.6 MG PO TABS
1.0000 | ORAL_TABLET | Freq: Two times a day (BID) | ORAL | Status: DC
  Administered 2018-01-08 – 2018-01-11 (×6): 8.6 mg via ORAL
  Filled 2018-01-08 (×6): qty 1

## 2018-01-08 MED ORDER — PHENOL 1.4 % MT LIQD: 1.0000 | OROMUCOSAL | Status: DC | PRN

## 2018-01-08 MED ORDER — ONDANSETRON HCL 4 MG/2ML IJ SOLN
4.0000 mg | Freq: Four times a day (QID) | INTRAMUSCULAR | Status: DC | PRN
  Administered 2018-01-08 – 2018-01-09 (×2): 4 mg via INTRAVENOUS
  Filled 2018-01-08 (×2): qty 2

## 2018-01-08 MED ORDER — ONDANSETRON HCL 4 MG/2ML IJ SOLN
INTRAMUSCULAR | Status: DC | PRN
  Administered 2018-01-08 (×2): 4 mg via INTRAVENOUS

## 2018-01-08 MED ORDER — CEFAZOLIN SODIUM-DEXTROSE 2-4 GM/100ML-% IV SOLN
2.0000 g | Freq: Once | INTRAVENOUS | Status: DC
  Filled 2018-01-08: qty 100

## 2018-01-08 MED ORDER — ARTIFICIAL TEARS OPHTHALMIC OINT
TOPICAL_OINTMENT | OPHTHALMIC | Status: DC | PRN
  Administered 2018-01-08: 1 via OPHTHALMIC

## 2018-01-08 MED ORDER — ACETAMINOPHEN 160 MG/5ML PO SOLN: 325.0000 mg | ORAL | Status: DC | PRN

## 2018-01-08 MED ORDER — METHOCARBAMOL 1000 MG/10ML IJ SOLN
500.0000 mg | Freq: Four times a day (QID) | INTRAMUSCULAR | Status: DC | PRN
  Filled 2018-01-08: qty 5

## 2018-01-08 MED ORDER — GLYCOPYRROLATE PF 0.2 MG/ML IJ SOSY
PREFILLED_SYRINGE | INTRAMUSCULAR | Status: AC
  Filled 2018-01-08: qty 2

## 2018-01-08 MED ORDER — MIDAZOLAM HCL 5 MG/5ML IJ SOLN
INTRAMUSCULAR | Status: DC | PRN
  Administered 2018-01-08: 2 mg via INTRAVENOUS

## 2018-01-08 MED ORDER — GABAPENTIN 300 MG PO CAPS
300.0000 mg | ORAL_CAPSULE | Freq: Three times a day (TID) | ORAL | Status: DC
  Administered 2018-01-08 – 2018-01-11 (×8): 300 mg via ORAL
  Filled 2018-01-08 (×8): qty 1

## 2018-01-08 MED ORDER — FENTANYL CITRATE (PF) 100 MCG/2ML IJ SOLN
INTRAMUSCULAR | Status: DC | PRN
  Administered 2018-01-08 (×6): 50 ug via INTRAVENOUS
  Administered 2018-01-08: 100 ug via INTRAVENOUS

## 2018-01-08 MED ORDER — SUCCINYLCHOLINE CHLORIDE 20 MG/ML IJ SOLN
INTRAMUSCULAR | Status: DC | PRN
  Administered 2018-01-08: 100 mg via INTRAVENOUS

## 2018-01-08 MED ORDER — PROPOFOL 500 MG/50ML IV EMUL
INTRAVENOUS | Status: DC | PRN
  Administered 2018-01-08: 50 ug/kg/min via INTRAVENOUS

## 2018-01-08 MED ORDER — LACTATED RINGERS IV SOLN
INTRAVENOUS | Status: DC | PRN
  Administered 2018-01-08 (×2): via INTRAVENOUS

## 2018-01-08 MED ORDER — HYDROMORPHONE HCL 2 MG/ML IJ SOLN
INTRAMUSCULAR | Status: AC
  Administered 2018-01-08: 0.5 mg via INTRAVENOUS
  Filled 2018-01-08: qty 1

## 2018-01-08 MED ORDER — OXYCODONE HCL 5 MG/5ML PO SOLN: 5.0000 mg | Freq: Once | ORAL | Status: DC | PRN

## 2018-01-08 MED ORDER — FENTANYL CITRATE (PF) 250 MCG/5ML IJ SOLN
INTRAMUSCULAR | Status: AC
  Filled 2018-01-08: qty 5

## 2018-01-08 MED ORDER — ACETAMINOPHEN 500 MG PO TABS
1000.0000 mg | ORAL_TABLET | Freq: Four times a day (QID) | ORAL | Status: AC
  Administered 2018-01-08 – 2018-01-09 (×3): 1000 mg via ORAL
  Filled 2018-01-08 (×3): qty 2

## 2018-01-08 MED ORDER — ZOLPIDEM TARTRATE 5 MG PO TABS: 5.0000 mg | ORAL_TABLET | Freq: Every evening | ORAL | Status: DC | PRN

## 2018-01-08 MED ORDER — LIDOCAINE-EPINEPHRINE 1 %-1:100000 IJ SOLN
INTRAMUSCULAR | Status: AC
  Filled 2018-01-08: qty 1

## 2018-01-08 MED ORDER — ROCURONIUM BROMIDE 100 MG/10ML IV SOLN
INTRAVENOUS | Status: DC | PRN
  Administered 2018-01-08: 50 mg via INTRAVENOUS
  Administered 2018-01-08: 10 mg via INTRAVENOUS

## 2018-01-08 MED ORDER — HYDROMORPHONE HCL 1 MG/ML IJ SOLN
0.5000 mg | INTRAMUSCULAR | Status: DC | PRN
  Administered 2018-01-08: 0.5 mg via INTRAVENOUS
  Administered 2018-01-08: 1 mg via INTRAVENOUS
  Administered 2018-01-08: 0.5 mg via INTRAVENOUS
  Filled 2018-01-08 (×3): qty 1

## 2018-01-08 MED ORDER — BACITRACIN ZINC 500 UNIT/GM EX OINT
TOPICAL_OINTMENT | CUTANEOUS | Status: DC | PRN
  Administered 2018-01-08: 1 via TOPICAL

## 2018-01-08 MED ORDER — SODIUM CHLORIDE 0.9 % IV SOLN: 250.0000 mL | INTRAVENOUS | Status: DC

## 2018-01-08 MED ORDER — THROMBIN 20000 UNITS EX SOLR
CUTANEOUS | Status: AC
  Filled 2018-01-08: qty 20000

## 2018-01-08 MED ORDER — HYDROMORPHONE HCL 2 MG/ML IJ SOLN
0.5000 mg | INTRAMUSCULAR | Status: AC | PRN
  Administered 2018-01-08 (×8): 0.5 mg via INTRAVENOUS

## 2018-01-08 MED ORDER — FENTANYL CITRATE (PF) 100 MCG/2ML IJ SOLN
25.0000 ug | INTRAMUSCULAR | Status: DC | PRN
Start: 2018-01-08 — End: 2018-01-08
  Administered 2018-01-08 (×3): 50 ug via INTRAVENOUS

## 2018-01-08 MED ORDER — PHENYLEPHRINE 40 MCG/ML (10ML) SYRINGE FOR IV PUSH (FOR BLOOD PRESSURE SUPPORT)
PREFILLED_SYRINGE | INTRAVENOUS | Status: AC
  Filled 2018-01-08: qty 10

## 2018-01-08 MED ORDER — DEXAMETHASONE SODIUM PHOSPHATE 10 MG/ML IJ SOLN
INTRAMUSCULAR | Status: AC
  Filled 2018-01-08: qty 1

## 2018-01-08 MED ORDER — METHOCARBAMOL 500 MG PO TABS
ORAL_TABLET | ORAL | Status: AC
  Filled 2018-01-08: qty 1

## 2018-01-08 MED ORDER — ACETAMINOPHEN 325 MG PO TABS: 325.0000 mg | ORAL_TABLET | ORAL | Status: DC | PRN

## 2018-01-08 MED ORDER — PROPOFOL 10 MG/ML IV BOLUS
INTRAVENOUS | Status: AC
  Filled 2018-01-08: qty 20

## 2018-01-08 MED ORDER — METHYLPREDNISOLONE ACETATE 80 MG/ML IJ SUSP
INTRAMUSCULAR | Status: AC
  Filled 2018-01-08: qty 1

## 2018-01-08 MED ORDER — DEXTROSE 5 % IV SOLN
INTRAVENOUS | Status: DC | PRN
  Administered 2018-01-08: 15 ug/min via INTRAVENOUS

## 2018-01-08 MED ORDER — PHENYLEPHRINE HCL 10 MG/ML IJ SOLN
INTRAMUSCULAR | Status: DC | PRN
  Administered 2018-01-08 (×3): 120 ug via INTRAVENOUS

## 2018-01-08 MED ORDER — CHLORHEXIDINE GLUCONATE CLOTH 2 % EX PADS: 6.0000 | MEDICATED_PAD | Freq: Once | CUTANEOUS | Status: DC

## 2018-01-08 MED ORDER — BUPIVACAINE HCL (PF) 0.5 % IJ SOLN
INTRAMUSCULAR | Status: DC | PRN
  Administered 2018-01-08: 5.5 mL
  Administered 2018-01-08: 5 mL

## 2018-01-08 MED ORDER — ARTIFICIAL TEARS OPHTHALMIC OINT
TOPICAL_OINTMENT | OPHTHALMIC | Status: AC
  Filled 2018-01-08: qty 3.5

## 2018-01-08 MED ORDER — CEFAZOLIN SODIUM 1 G IJ SOLR
INTRAMUSCULAR | Status: AC
  Filled 2018-01-08: qty 10

## 2018-01-08 MED ORDER — BACITRACIN ZINC 500 UNIT/GM EX OINT
TOPICAL_OINTMENT | CUTANEOUS | Status: AC
  Filled 2018-01-08: qty 28.35

## 2018-01-08 MED ORDER — BUPIVACAINE HCL (PF) 0.5 % IJ SOLN
INTRAMUSCULAR | Status: AC
  Filled 2018-01-08: qty 30

## 2018-01-08 MED ORDER — EPHEDRINE SULFATE 50 MG/ML IJ SOLN
INTRAMUSCULAR | Status: DC | PRN
  Administered 2018-01-08 (×3): 10 mg via INTRAVENOUS

## 2018-01-08 MED ORDER — DEXAMETHASONE SODIUM PHOSPHATE 10 MG/ML IJ SOLN
INTRAMUSCULAR | Status: DC | PRN
  Administered 2018-01-08: 10 mg via INTRAVENOUS

## 2018-01-08 MED ORDER — METHOCARBAMOL 500 MG PO TABS
500.0000 mg | ORAL_TABLET | Freq: Four times a day (QID) | ORAL | Status: DC | PRN
  Administered 2018-01-08 – 2018-01-11 (×8): 500 mg via ORAL
  Filled 2018-01-08 (×7): qty 1

## 2018-01-08 MED ORDER — CEFAZOLIN SODIUM-DEXTROSE 2-4 GM/100ML-% IV SOLN
2.0000 g | INTRAVENOUS | Status: AC
  Administered 2018-01-08 (×2): 2 g via INTRAVENOUS
  Filled 2018-01-08: qty 100

## 2018-01-08 MED ORDER — MENTHOL 3 MG MT LOZG: 1.0000 | LOZENGE | OROMUCOSAL | Status: DC | PRN

## 2018-01-08 MED ORDER — BISACODYL 10 MG RE SUPP
10.0000 mg | Freq: Every day | RECTAL | Status: DC | PRN
  Administered 2018-01-09: 10 mg via RECTAL
  Filled 2018-01-08: qty 1

## 2018-01-08 MED ORDER — HYDROCODONE-ACETAMINOPHEN 5-325 MG PO TABS
1.0000 | ORAL_TABLET | ORAL | Status: DC | PRN
  Administered 2018-01-09 – 2018-01-11 (×6): 1 via ORAL
  Filled 2018-01-08 (×6): qty 1

## 2018-01-08 MED ORDER — ACETAMINOPHEN 325 MG PO TABS
650.0000 mg | ORAL_TABLET | ORAL | Status: DC | PRN
  Administered 2018-01-09: 650 mg via ORAL
  Filled 2018-01-08: qty 2

## 2018-01-08 MED ORDER — MIDAZOLAM HCL 2 MG/2ML IJ SOLN
INTRAMUSCULAR | Status: AC
  Filled 2018-01-08: qty 2

## 2018-01-08 MED ORDER — THROMBIN 5000 UNITS EX SOLR
OROMUCOSAL | Status: DC | PRN
  Administered 2018-01-08 (×3): 5 mL via TOPICAL

## 2018-01-08 MED ORDER — NEOSTIGMINE METHYLSULFATE 5 MG/5ML IV SOSY
PREFILLED_SYRINGE | INTRAVENOUS | Status: AC
  Filled 2018-01-08: qty 5

## 2018-01-08 MED ORDER — SODIUM CHLORIDE 0.9 % IV SOLN
0.1000 mg/kg/h | INTRAVENOUS | Status: DC
  Administered 2018-01-08: .15 mg/kg/h via INTRAVENOUS
  Filled 2018-01-08: qty 2

## 2018-01-08 MED ORDER — LIDOCAINE-EPINEPHRINE 1 %-1:100000 IJ SOLN
INTRAMUSCULAR | Status: DC | PRN
  Administered 2018-01-08: 5.5 mL
  Administered 2018-01-08: 5 mL

## 2018-01-08 MED ORDER — SODIUM CHLORIDE 0.9% FLUSH
3.0000 mL | Freq: Two times a day (BID) | INTRAVENOUS | Status: DC
  Administered 2018-01-09 – 2018-01-11 (×5): 3 mL via INTRAVENOUS

## 2018-01-08 MED ORDER — ROCURONIUM BROMIDE 50 MG/5ML IV SOLN
INTRAVENOUS | Status: AC
  Filled 2018-01-08: qty 2

## 2018-01-08 MED ORDER — ONDANSETRON HCL 4 MG PO TABS: 4.0000 mg | ORAL_TABLET | Freq: Four times a day (QID) | ORAL | Status: DC | PRN

## 2018-01-08 MED ORDER — 0.9 % SODIUM CHLORIDE (POUR BTL) OPTIME
TOPICAL | Status: DC | PRN
  Administered 2018-01-08: 1000 mL

## 2018-01-08 MED ORDER — SODIUM CHLORIDE 0.9% FLUSH: 3.0000 mL | INTRAVENOUS | Status: DC | PRN

## 2018-01-08 MED ORDER — LIDOCAINE HCL (CARDIAC) PF 100 MG/5ML IV SOSY
PREFILLED_SYRINGE | INTRAVENOUS | Status: DC | PRN
  Administered 2018-01-08: 100 mg via INTRAVENOUS

## 2018-01-08 MED ORDER — SODIUM CHLORIDE 0.9 % IV SOLN: INTRAVENOUS | Status: DC

## 2018-01-08 MED ORDER — PROPOFOL 10 MG/ML IV BOLUS
INTRAVENOUS | Status: DC | PRN
Start: 2018-01-08 — End: 2018-01-08
  Administered 2018-01-08: 100 mg via INTRAVENOUS

## 2018-01-08 SURGICAL SUPPLY — 125 items
BAG DECANTER FOR FLEXI CONT (MISCELLANEOUS) ×6 IMPLANT
BASKET BONE COLLECTION (BASKET) ×6 IMPLANT
BENZOIN TINCTURE PRP APPL 2/3 (GAUZE/BANDAGES/DRESSINGS) IMPLANT
BIT DRILL LONG 3.0X30 (BIT) ×5 IMPLANT
BIT DRILL LONG 3.0X30MM (BIT) ×1
BIT DRILL LONG 3X80 (BIT) IMPLANT
BIT DRILL LONG 3X80MM (BIT)
BIT DRILL LONG 4X80 (BIT) IMPLANT
BIT DRILL LONG 4X80MM (BIT)
BIT DRILL SHORT 3.0X30 (BIT) IMPLANT
BIT DRILL SHORT 3.0X30MM (BIT)
BIT DRILL SHORT 3X80 (BIT) IMPLANT
BIT DRILL SHORT 3X80MM (BIT)
BLADE CLIPPER SURG (BLADE) IMPLANT
BLADE SURG 11 STRL SS (BLADE) ×6 IMPLANT
BUR MATCHSTICK NEURO 3.0 LAGG (BURR) ×6 IMPLANT
BUR PRECISION FLUTE 5.0 (BURR) ×6 IMPLANT
CAGE MODULUS XL 10X18X55 - 10 (Cage) ×12 IMPLANT
CAGE SPINAL 8X11X26 15D (Cage) ×5 IMPLANT
CAGE SPINAL 8X11X26MM 15DEG (Cage) ×1 IMPLANT
CANISTER SUCT 3000ML PPV (MISCELLANEOUS) ×6 IMPLANT
CARTRIDGE OIL MAESTRO DRILL (MISCELLANEOUS) ×4 IMPLANT
CATH FOLEY 2WAY SLVR  5CC 14FR (CATHETERS)
CATH FOLEY 2WAY SLVR 5CC 14FR (CATHETERS) IMPLANT
CATH ROBINSON RED A/P 12FR (CATHETERS) IMPLANT
CLOSURE WOUND 1/2 X4 (GAUZE/BANDAGES/DRESSINGS)
CONT SPEC 4OZ CLIKSEAL STRL BL (MISCELLANEOUS) IMPLANT
COVER BACK TABLE 60X90IN (DRAPES) IMPLANT
DECANTER SPIKE VIAL GLASS SM (MISCELLANEOUS) ×12 IMPLANT
DERMABOND ADVANCED (GAUZE/BANDAGES/DRESSINGS) ×2
DERMABOND ADVANCED .7 DNX12 (GAUZE/BANDAGES/DRESSINGS) ×4 IMPLANT
DIFFUSER DRILL AIR PNEUMATIC (MISCELLANEOUS) ×6 IMPLANT
DISSECTOR BLUNT TIP ENDO 5MM (MISCELLANEOUS) ×6 IMPLANT
DRAPE C-ARM 42X72 X-RAY (DRAPES) ×12 IMPLANT
DRAPE C-ARMOR (DRAPES) ×18 IMPLANT
DRAPE LAPAROTOMY 100X72X124 (DRAPES) ×12 IMPLANT
DRAPE MICROSCOPE LEICA (MISCELLANEOUS) ×6 IMPLANT
DRAPE POUCH INSTRU U-SHP 10X18 (DRAPES) IMPLANT
DRAPE SHEET LG 3/4 BI-LAMINATE (DRAPES) ×12 IMPLANT
DRAPE SURG 17X23 STRL (DRAPES) ×6 IMPLANT
DRSG OPSITE POSTOP 3X4 (GAUZE/BANDAGES/DRESSINGS) ×6 IMPLANT
DRSG OPSITE POSTOP 4X6 (GAUZE/BANDAGES/DRESSINGS) ×6 IMPLANT
DRSG OPSITE POSTOP 4X8 (GAUZE/BANDAGES/DRESSINGS) ×12 IMPLANT
DURAPREP 26ML APPLICATOR (WOUND CARE) ×12 IMPLANT
ELECT BLADE 4.0 EZ CLEAN MEGAD (MISCELLANEOUS) ×6
ELECT REM PT RETURN 9FT ADLT (ELECTROSURGICAL) ×12
ELECTRODE BLDE 4.0 EZ CLN MEGD (MISCELLANEOUS) ×4 IMPLANT
ELECTRODE REM PT RTRN 9FT ADLT (ELECTROSURGICAL) ×8 IMPLANT
GAUZE SPONGE 4X4 12PLY STRL (GAUZE/BANDAGES/DRESSINGS) IMPLANT
GAUZE SPONGE 4X4 16PLY XRAY LF (GAUZE/BANDAGES/DRESSINGS) ×6 IMPLANT
GLOVE BIO SURGEON STRL SZ7 (GLOVE) IMPLANT
GLOVE BIO SURGEON STRL SZ7.5 (GLOVE) ×12 IMPLANT
GLOVE BIOGEL PI IND STRL 6.5 (GLOVE) ×12 IMPLANT
GLOVE BIOGEL PI IND STRL 7.0 (GLOVE) IMPLANT
GLOVE BIOGEL PI IND STRL 7.5 (GLOVE) ×20 IMPLANT
GLOVE BIOGEL PI INDICATOR 6.5 (GLOVE) ×6
GLOVE BIOGEL PI INDICATOR 7.0 (GLOVE)
GLOVE BIOGEL PI INDICATOR 7.5 (GLOVE) ×10
GLOVE ECLIPSE 6.5 STRL STRAW (GLOVE) ×6 IMPLANT
GLOVE ECLIPSE 7.0 STRL STRAW (GLOVE) ×18 IMPLANT
GLOVE ECLIPSE 8.0 STRL XLNG CF (GLOVE) ×6 IMPLANT
GLOVE EXAM NITRILE LRG STRL (GLOVE) IMPLANT
GLOVE EXAM NITRILE XL STR (GLOVE) IMPLANT
GLOVE EXAM NITRILE XS STR PU (GLOVE) IMPLANT
GLOVE SURG SS PI 6.0 STRL IVOR (GLOVE) ×12 IMPLANT
GLOVE SURG SS PI 6.5 STRL IVOR (GLOVE) ×12 IMPLANT
GLOVE SURG SS PI 7.0 STRL IVOR (GLOVE) ×36 IMPLANT
GOWN STRL REUS W/ TWL LRG LVL3 (GOWN DISPOSABLE) ×36 IMPLANT
GOWN STRL REUS W/ TWL XL LVL3 (GOWN DISPOSABLE) ×8 IMPLANT
GOWN STRL REUS W/TWL 2XL LVL3 (GOWN DISPOSABLE) IMPLANT
GOWN STRL REUS W/TWL LRG LVL3 (GOWN DISPOSABLE) ×18
GOWN STRL REUS W/TWL XL LVL3 (GOWN DISPOSABLE) ×4
GUIDEWIRE NITINOL BEVEL TIP (WIRE) ×48 IMPLANT
HEMOSTAT POWDER KIT SURGIFOAM (HEMOSTASIS) ×18 IMPLANT
KIT BASIN OR (CUSTOM PROCEDURE TRAY) ×6 IMPLANT
KIT DILATOR XLIF 5 (KITS) ×5 IMPLANT
KIT INFUSE X SMALL 1.4CC (Orthopedic Implant) ×6 IMPLANT
KIT INFUSE XX SMALL 0.7CC (Orthopedic Implant) ×6 IMPLANT
KIT SPINE MAZOR X ROBO DISP (MISCELLANEOUS) ×6 IMPLANT
KIT SURGICAL ACCESS MAXCESS 4 (KITS) ×6 IMPLANT
KIT TURNOVER KIT B (KITS) ×6 IMPLANT
KIT XLIF (KITS) ×1
MODULE EMG NEEDLE SSEP NVM5 (NEEDLE) ×6 IMPLANT
MODULE NVM5 NEXT GEN EMG (NEEDLE) ×6 IMPLANT
NEEDLE HYPO 18GX1.5 BLUNT FILL (NEEDLE) IMPLANT
NEEDLE HYPO 22GX1.5 SAFETY (NEEDLE) ×6 IMPLANT
NEEDLE HYPO 25X1 1.5 SAFETY (NEEDLE) IMPLANT
NEEDLE SPNL 18GX3.5 QUINCKE PK (NEEDLE) IMPLANT
NS IRRIG 1000ML POUR BTL (IV SOLUTION) ×6 IMPLANT
OIL CARTRIDGE MAESTRO DRILL (MISCELLANEOUS) ×6
PACK LAMINECTOMY NEURO (CUSTOM PROCEDURE TRAY) ×12 IMPLANT
PAD ARMBOARD 7.5X6 YLW CONV (MISCELLANEOUS) ×18 IMPLANT
PATTIES SURGICAL 1X1 (DISPOSABLE) ×6 IMPLANT
PIN HEAD 2.5X60MM (PIN) IMPLANT
PUTTY BONE ATTRAX 10CC STRIP (Putty) ×6 IMPLANT
ROD RELINE MAS LORD 5.5X85MM (Rod) ×6 IMPLANT
ROD RELINE MAS LORD 5.5X95MM (Rod) ×6 IMPLANT
RUBBERBAND STERILE (MISCELLANEOUS) ×12 IMPLANT
SCREW LOCK RELINE 5.5 TULIP (Screw) ×48 IMPLANT
SCREW MAS RELINE 6.5X45 POLY (Screw) ×12 IMPLANT
SCREW MAS RELINE 6.5X50 POLY (Screw) ×6 IMPLANT
SCREW PA RELINE MAS 5.5X50 C2 (Screw) ×12 IMPLANT
SCREW RELINE MAS 7.5X50MM POLY (Screw) ×6 IMPLANT
SCREW SCHANZ SA 4.0MM (MISCELLANEOUS) ×6 IMPLANT
SCREW SHANK RELINE 6.5X45MM 2C (Screw) ×12 IMPLANT
SPINE TULIP RELINE MOD (Neuro Prosthesis/Implant) ×12 IMPLANT
SPONGE LAP 4X18 RFD (DISPOSABLE) IMPLANT
SPONGE SURGIFOAM ABS GEL 100 (HEMOSTASIS) IMPLANT
SPONGE SURGIFOAM ABS GEL SZ50 (HEMOSTASIS) IMPLANT
SPONGE TONSIL TAPE 1 RFD (DISPOSABLE) IMPLANT
STAPLER VISISTAT 35W (STAPLE) ×12 IMPLANT
STRIP CLOSURE SKIN 1/2X4 (GAUZE/BANDAGES/DRESSINGS) IMPLANT
SUT VIC AB 0 CT1 18XCR BRD8 (SUTURE) ×12 IMPLANT
SUT VIC AB 0 CT1 8-18 (SUTURE) ×6
SUT VIC AB 2-0 CT1 18 (SUTURE) ×18 IMPLANT
SUT VIC AB 3-0 SH 8-18 (SUTURE) ×6 IMPLANT
SUT VICRYL 3-0 RB1 18 ABS (SUTURE) ×30 IMPLANT
SYR 3ML LL SCALE MARK (SYRINGE) IMPLANT
TOWEL GREEN STERILE (TOWEL DISPOSABLE) ×6 IMPLANT
TOWEL GREEN STERILE FF (TOWEL DISPOSABLE) ×6 IMPLANT
TRAP SPECIMEN MUCOUS 40CC (MISCELLANEOUS) IMPLANT
TRAY FOLEY MTR SLVR 16FR STAT (SET/KITS/TRAYS/PACK) ×6 IMPLANT
TUBE MAZOR SA REDUCTION (TUBING) ×6 IMPLANT
TULIP SPINE RELINE MOD (Neuro Prosthesis/Implant) ×8 IMPLANT
WATER STERILE IRR 1000ML POUR (IV SOLUTION) ×6 IMPLANT

## 2018-01-08 NOTE — Op Note (Signed)
NEUROSURGERY OPERATIVE NOTE   PREOP DIAGNOSIS:  1. Lumbago with radiculopathy, L3-4, L4-5, L5-S1 2. Degenerative scoliosis 3. Lumbar spondylosis, L3-4, L4-5, L5-S1   POSTOP DIAGNOSIS: Same  PROCEDURE STAGE 1: 1. Anterior interbody biomechanical device placement via lateral transpsoas approach, L3-4, L4-5 - NuVasive 3D printed titanium [id number] @ L3-4, 18x55x10 @ L4-5 2. Interbody arthrodesis, L3-4, L4-5 3. Use of non-structural bone allograft - DBM putty and BMP 4. Intraoperative electrophysiologic monitoring  PROCEDURE STAGE 2: 1.   Posterior segmental instrumentation, L3-S1 2.   Posterior interbody biomechanical device placement L5-S1, NuVasive expandable cage, 8mm lordotic 3.   Posterior interbody arthodesis, L5-S1 4.   Use of locally harvested morcelized bone autograft 5.   Use of non-structural bone allograft - BMP 6.   Use of intraoperative robotic assistance 7.   Use of intraoperative microscope  SURGEON: Dr. Lisbeth Renshaw, MD  ASSISTANT: Cindra Presume, PA-C  ANESTHESIA: General Endotracheal  EBL: 200cc  SPECIMENS: None  DRAINS: None  COMPLICATIONS: None immediate  CONDITION: Hemodynamically stable to PACU  HISTORY: Billy Erickson is a 72 y.o. male with chronic history of back and leg pain.  He is attempted multiple different conservative treatments with progression of this pain, and more recent development of mild right leg weakness.  His MRI demonstrates multilevel lumbar spondylosis including degenerative dextroconvex scoliosis.  After lengthy discussion, he elected to proceed with multilevel staged decompression and fusion.  The risks and benefits of the surgery were explained in detail to both the patient and his family.  After all questions were answered informed consent was obtained and witnessed.  PROCEDURE IN DETAIL: The patient was brought to the operating room. After induction of general anesthesia, the patient was positioned on the  operative table in the lateral position with the right side up. All pressure points were meticulously padded. Skin incision was then marked out and prepped and draped in the usual sterile fashion.  Electrodes were placed to monitor intraoperative EMG and SSEPs.  Good baseline was obtained.  After timeout was conducted, right flank incision was made after surface projection of the L3-4 and L4-5 disc spaces were marked out.  Incision was then carried down through the oblique musculature until the transversalis fascia was identified.  A small counterincision was then made and carried down through subtenons tissue.  Again, the oblique musculature was divided, the transversalis fascia was punctured, and the retroperitoneal space was entered.  The retroperitoneal space was dissected bluntly, and the iliac crest, the 12th rib, and the transverse process at L3 and L4 were identified.  Using a small dilator, the transversalis fascia was then punctured through the main flank incision, and carried down into the surface of the psoas muscle.  Lateral fluoroscopy was then used to identify the docking point initially at L4-5, and the posterior half of the disc space.  K wire was then placed, and dilators were sequentially placed, with intraoperative live monitoring.  Stimulation threshold remained relatively high indicating we were not near the lumbar plexus.  Table mounted retractor was then placed.  Shim was placed into the L4-5 disc space.  Retractor was then opened.  At this point, the disc space was incised, and using a combination of curettes, rongeurs, and rasps, as well as Cobb elevator, the entire length of the disc space was cleaned out.  The Cobb elevator was used to open up the contralateral annulus.  Once discectomy was completed, and the endplates were prepared, the above trial graft was inserted.  The graft  was then prepared and packed with DBM putty and BMP.  This was then tapped into the disc space under live  AP fluoroscopy.  Once the graft was placed, inserter was removed, and position was checked with lateral fluoroscopy as well.  Once this was completed, good hemostasis was achieved with bipolar electrocautery.  The retractor was removed.  At this point, in a similar fashion, the small dilator was again placed under the surface of the psoas muscle overlying the L3-4 disc space.  Once the correct positioning was identified, the K wire was placed, and again using sequential dilators under live electrophysiologic monitoring, the dilators were placed over the L3-4 disc space.  Again, stimulation threshold remained high.  Table mounted retractor was again placed over the dilators.  Shim was then placed into the L3-4 disc space.  AP and lateral fluoroscopy were used to confirm adequate positioning of the retractor.  Retractor was then opened, and again using a similar technique with curettes, Rogers, and rasps, discectomy was completed.  Cobb elevator was used to open up the contralateral annulus.  The above graft was again selected after placement of trial within the disc space.  The graft was prepared by packing with DBM putty and BMP.  The graft was again placed at the L3-4 interspace under live AP fluoroscopy.  Final position was again checked with lateral fluoroscopy.  At this point hemostasis was achieved with bipolar electrocautery.  The retractor was removed.  The wounds were then closed in multiple layers using a combination of 0 Vicryl stitches.  The skin was closed with interrupted subcuticular 3-0 Vicryl stitches.  A layer of Dermabond was placed.  At the end of the case all sponge, needle, instrument, and cottonoid counts were correct.  The patient was then transferred from the operative table to the stretcher.  The Jean Rosenthal table was then brought into the room, the patient was turned prone in preparation for the second stage of the procedure.  The skin of the low back was prepped and draped in the usual  sterile fashion.  After second timeout was completed, a small incision was made overlying the posterior superior iliac spine.  A Schanz pin was placed on the right side, and the Mazor robot was attached.  AP and oblique fluoroscopy was then used to register with the preoperative stereotactic lumbar spine CT scan.  A satisfactory accuracy was achieved.  Preoperative planning for pedicle screws at L3, L4, L5, and S1 was done, and used to plan out the skin incisions on the right and the left.  Incision was then infiltrated with local anesthetic with epinephrine.  Incision was then made sharply, and Bovie electrocautery was used to dissected down through subcutaneous tissue.  Using robotic assistance, pilot holes for pedicle screws bilaterally at L3, L4, L5, and S1 were drilled, and K wires were placed.  Once all the K wires were placed, AP and lateral fluoroscopic images were taken which demonstrated position of the K wires within the pedicles.  At this point, pedicle screws were then placed bilaterally at L3, L4, and on the right at L5 and S1.  Screw shanks were placed on the left at L5 and S1 connected to retractor blades.  Final images with the screws and appeared to demonstrate good positioning of the pedicle screws.  At this point, a retractor was placed on the [place] at L5 and S1. A medial retractor blade was placed, and the paraspinal musculature was retracted medially along the spinous process at  L5.  Bovie electrocautery was used to dissected over the lateral aspect of the L5 lamina, and the pars was identified.  Using the high-speed drill, laminotomy on the left at L5 was completed, with a facetectomy.  The lateral edge of the thecal sac was identified, as was the underlying disc space.  Ball-tipped dissector was used to identify both the L5 and the S1 pedicle on the left.  The pedicle screws did not appear to breach as I was unable to palpate the screws within the foramen.  The disc space was then  incised, and using a combination of curettes, Rogers, and disc space shavers, discectomy and endplate preparation was completed at L5-S1.  An 8 mm expandable lordotic graft was selected and packed with morselized bone harvested during the decompression.  This was also mixed with BMP.  The implant was then tapped into place, and position was confirmed with AP and lateral fluoroscopy.  The implant was then expanded to 11 mm.  Inserter was then removed.  Hemostasis was then secured with morselized Gelfoam and thrombin.  A 95 mm pre-bent lordotic rod was then passed from a superior to inferior direction on the left side.  Setscrews were then placed and final tightened.  Towers were then removed.  Similarly, a 90 mm pre-bent lordotic rod was placed on the right side from L3-S1.  Again, set screws were placed and final tightened and the towers were removed.  Final AP and lateral fluoroscopic images demonstrated good position of the implanted hardware.  At this point the fascial incisions were closed with interrupted 0 Vicryl stitches.  The deep dermal layer was closed with interrupted 2-0 Vicryl stitches, and the skin was closed with staples.  Bacitracin ointment and sterile dressings were applied.  At the end of the case all sponge, needle, instrument, and cottonoid counts were correct.  The patient was then transferred to the stretcher, extubated, and taken to the postanesthesia care unit in stable hemodynamic condition.

## 2018-01-08 NOTE — Anesthesia Preprocedure Evaluation (Addendum)
Anesthesia Evaluation  Patient identified by MRN, date of birth, ID band Patient awake    Reviewed: Allergy & Precautions, NPO status , Patient's Chart, lab work & pertinent test results  History of Anesthesia Complications Negative for: history of anesthetic complications  Airway Mallampati: II  TM Distance: >3 FB Neck ROM: Full    Dental  (+) Dental Advisory Given   Pulmonary shortness of breath,    breath sounds clear to auscultation       Cardiovascular + Valvular Problems/Murmurs AI  Rhythm:Regular     Neuro/Psych  Neuromuscular disease negative psych ROS   GI/Hepatic GERD  Medicated and Controlled,  Endo/Other  negative endocrine ROS  Renal/GU Renal disease     Musculoskeletal  (+) Arthritis ,   Abdominal   Peds  Hematology negative hematology ROS (+)   Anesthesia Other Findings  DISCUSSION: Patient is a 72 year old male scheduled for the above procedure. History includes never smoker, GERD, HLD, BPH, liver and renal cysts (11/13/08 U/S), dyspnea on exertion (with [facility] ~ [facility]; non-ischemic Myoview, mild-moderate AR by echo).  He had a stress and echo within the past two years. No acute cardiopulmonary issues reported. If no acute changes then I would anticipate that he can proceed as planned.   VS: BP (!) 138/91   Pulse (!) 58   Temp 36.7 C   Resp 20   Ht 5\' 10"  (1.778 m)   Wt 202 lb 14.4 oz (92 kg)   SpO2 98%   BMI 29.11 kg/m   PROVIDERS: ViaCaryn Bee, Kevin, MD is PCP [facility]) He is not routinely followed by cardiology, but he was evaluated by Georga Hackingilley, W. Spencer, MD in 2017 for exertional dyspnea with testing as outlined below. Office confirmed that last visit was in 2017.    LABS: Labs reviewed: Acceptable for surgery. (all labs ordered are listed, but only abnormal results are displayed)  Labs Reviewed BASIC METABOLIC PANEL - Abnormal; Notable for the  following components:     Result Value   Glucose, Bld 108 (*)   All other components within normal limits SURGICAL PCR SCREEN CBC TYPE AND SCREEN ABO/RH   IMAGES: U/S abdominal 11/13/08: IMPRESSION: 1. No gallstones. No ductal dilatation. 2. Multiple bilateral renal cysts. No solid renal lesion is seen. 3. Multiple liver cysts. 4. Fatty infiltration of the liver.   EKG: 12/31/17: SB at 55 bpm   CV: Nuclear stress test 07/13/16 (done due to abnormal ETT, [facility]; scanned under Media tab, Correspondence 03/19/17):  IMPRESSION: 1.  Normal stress Myoview study with no evidence of ischemia or infarction. 2.  Normal quantitative gated SPECT ejection fraction of 52% with normal wall motion and wall thickening. Recommendations: The patient has good exercise tolerance with no evidence of myocardial ischemia.  This current treadmill test did not show the ST depression that he previously had on the treadmill and he may be reassured at this time.  He should continue with significant risk factor modification try to lose additional weight.  Echo 05/29/16 [facility]; scanned under Media tab, Correspondence 03/19/17): Conclusion: 1.  Mild concentric LVH with normal global wall motion.  Estimated EF 60%. 2.  Left atrial cavity is mildly dilated. 3.  Trileaflet aortic valve with mild to moderate regurgitation. 4.  Trace mitral, tricuspid, and pulmonic regurgitation.   Past Medical History: Diagnosis Date . Arthritis  . Dyspnea   with exercise only . Enlarged prostate  . GERD (gastroesophageal reflux disease)  . History of kidney stones  .  Hyperlipidemia  . Kidney stones  . Liver cyst  . Wrist pain, right    Past Surgical History: Procedure Laterality Date . ANTERIOR CRUCIATE LIGAMENT REPAIR   . CYSTOSCOPY/RETROGRADE/URETEROSCOPY  02/13/2008  basketing of fragments . EXTRACORPOREAL SHOCK WAVE LITHOTRIPSY Left 03/19/2017  Procedure:  LEFT EXTRACORPOREAL SHOCK WAVE LITHOTRIPSY (ESWL);  Surgeon: Crist Fat, MD;  [facility];  Service: Urology;  Laterality: Left; . KNEE ARTHROSCOPY WITH MEDIAL MENISECTOMY Left 08/18/2016  Procedure: LEFT KNEE ARTHROSCOPY, CHONDROPLASTY AND PARTIAL MEDIAL AND LATERAL MENISCECTOMY;  Surgeon: Sheral Apley, MD;  Location: [facility];  Service: Orthopedics;  Laterality: Left; . KNEE SURGERY Right  . MRI   . nuclear stress test   . TONSILLECTOMY   . WRIST ARTHROSCOPY WITH ULNA SHORTENING Right 07/06/2015  Procedure: RIGHT WRIST WITH  ARTHROSCOPIC DEBRIDEMENT AND ULNAR SHORTENING OSTEOTOMY;  Surgeon: Mack Hook, MD;  Location: [facility];  Service: Orthopedics;  Laterality: Right;   MEDICATIONS: . aspirin EC 81 MG tablet . baclofen (LIORESAL) 10 MG tablet . gabapentin (NEURONTIN) 300 MG capsule . HYDROcodone-acetaminophen (NORCO/VICODIN) 5-325 MG tablet . ibuprofen (ADVIL,MOTRIN) 600 MG tablet . ketorolac (TORADOL) 10 MG tablet . Multiple Vitamin (MULTIVITAMIN WITH MINERALS) TABS tablet . omeprazole (PRILOSEC) 20 MG capsule . ondansetron (ZOFRAN) 4 MG tablet . oxyCODONE-acetaminophen (ROXICET) 5-325 MG tablet . simvastatin (ZOCOR) 40 MG tablet . zolpidem (AMBIEN CR) 6.25 MG CR tablet . zolpidem (AMBIEN) 5 MG tablet  No current facility-administered medications for this encounter.  He is to hold ASA as directed by surgeon. (Surgeon's office instructed him to stop on 01/01/18.)     Reproductive/Obstetrics                            Anesthesia Physical Anesthesia Plan  ASA: II  Anesthesia Plan: General   Post-op Pain Management:    Induction: Intravenous  PONV Risk Score and Plan: 2 and Ondansetron and Dexamethasone  Airway Management Planned: Oral ETT  Additional Equipment: Arterial line  Intra-op Plan:   Post-operative Plan: Extubation in OR  Informed Consent: I have reviewed the  patients History and Physical, chart, labs and discussed the procedure including the risks, benefits and alternatives for the proposed anesthesia with the patient or authorized representative who has indicated his/her understanding and acceptance.   Dental advisory given  Plan Discussed with: CRNA  Anesthesia Plan Comments:         Anesthesia Quick Evaluation                                  Anesthesia Evaluation  Patient identified by MRN, date of birth, ID band Patient awake    Reviewed: Allergy & Precautions, NPO status , Patient's Chart, lab work & pertinent test results  Airway Mallampati: II  TM Distance: >3 FB     Dental   Pulmonary shortness of breath,    breath sounds clear to auscultation       Cardiovascular negative cardio ROS   Rhythm:Regular Rate:Normal     Neuro/Psych    GI/Hepatic Neg liver ROS, GERD  ,  Endo/Other  negative endocrine ROS  Renal/GU Renal disease     Musculoskeletal   Abdominal   Peds  Hematology   Anesthesia Other Findings   Reproductive/Obstetrics  Anesthesia Physical Anesthesia Plan  ASA: III  Anesthesia Plan: General   Post-op Pain Management:    Induction: Intravenous  Airway Management Planned: LMA  Additional Equipment:   Intra-op Plan:   Post-operative Plan: Extubation in OR  Informed Consent: I have reviewed the patients History and Physical, chart, labs and discussed the procedure including the risks, benefits and alternatives for the proposed anesthesia with the patient or authorized representative who has indicated his/her understanding and acceptance.   Dental advisory given  Plan Discussed with: CRNA and Anesthesiologist  Anesthesia Plan Comments:         Anesthesia Quick Evaluation

## 2018-01-08 NOTE — Transfer of Care (Signed)
Immediate Anesthesia Transfer of Care Note  Patient: Billy KocherWilliam N Seifried  Procedure(s) Performed: ANTERIOR LATERAL INTERBODY FUSION LUMBAR THREE- LUMBAR FOUR, LUMBAR FOUR- LUMBAR FIVE (Right Flank) LUMBAR PERCUTANEOUS PEDICLE SCREW PLACEMENT LUMBAR THREE-FOUR, LUMBAR FOUR-FIVE, LUMBAR FIVE-SACRAL ONE (N/A Back) APPLICATION OF ROBOTIC ASSISTANCE FOR SPINAL PROCEDURE (N/A ) TRANSFORAMINAL LUMBAR INTERBODY FUSION LUMBAR FIVE-SACRAL ONE (N/A Back)  Patient Location: PACU  Anesthesia Type:General  Level of Consciousness: awake, oriented, drowsy and patient cooperative  Airway & Oxygen Therapy: Patient Spontanous Breathing and Patient connected to nasal cannula oxygen  Post-op Assessment: Report given to RN and Post -op Vital signs reviewed and stable  Post vital signs: Reviewed and stable  Last Vitals:  Vitals Value Taken Time  BP 147/77 01/08/2018  6:06 PM  Temp    Pulse 94 01/08/2018  6:10 PM  Resp 26 01/08/2018  6:10 PM  SpO2 100 % 01/08/2018  6:10 PM  Vitals shown include unvalidated device data.  Last Pain:  Vitals:   01/08/18 0846  TempSrc:   PainSc: 3       Patients Stated Pain Goal: 5 (01/08/18 0846)  Complications: No apparent anesthesia complications

## 2018-01-08 NOTE — Anesthesia Procedure Notes (Signed)
Arterial Line Insertion Start/End6/18/2019 11:12 AM, 01/08/2018 11:16 AM Performed by: Jeani HawkingBerry, Nishan Ovens J, CRNA, CRNA  Patient location: OR. Preanesthetic checklist: patient identified, IV checked, site marked, risks and benefits discussed, surgical consent, monitors and equipment checked, pre-op evaluation, timeout performed and anesthesia consent Left, radial was placed Catheter size: 20 G Maximum sterile barriers used   Attempts: 1 Procedure performed without using ultrasound guided technique.

## 2018-01-08 NOTE — H&P (Signed)
Chief Complaint   Back and leg pain  History of Present Illness  Billy KocherWilliam N Erickson is a 72 y.o. male  I am seeing for the above. He has a chronic history of back and primarily left-sided buttock pain, which has been treated conservatively. He was complaining of new onset of right leg weakness, as well as an episode of urinary incontinence after the most recent epidural steroid injection. At that visit I did order MRI of the lumbar spine to assess for any new compression which did not show any new findings. He presents today for surgical decompression and fusion.  Past Medical History   Past Medical History:  Diagnosis Date  . Aortic regurgitation    mild to moderate AR 05/29/16 echo [facility])  . Arthritis   . Dyspnea    with exercise only  . Enlarged prostate   . GERD (gastroesophageal reflux disease)   . History of kidney stones   . Hyperlipidemia   . Kidney stones   . Liver cyst   . Wrist pain, right     Past Surgical History   Past Surgical History:  Procedure Laterality Date  . ANTERIOR CRUCIATE LIGAMENT REPAIR    . CYSTOSCOPY/RETROGRADE/URETEROSCOPY  02/13/2008   basketing of fragments  . EXTRACORPOREAL SHOCK WAVE LITHOTRIPSY Left 03/19/2017   Procedure: LEFT EXTRACORPOREAL SHOCK WAVE LITHOTRIPSY (ESWL);  Surgeon: Crist FatHerrick, Benjamin W, MD;  Location: [facility];  Service: Urology;  Laterality: Left;  . KNEE ARTHROSCOPY WITH MEDIAL MENISECTOMY Left 08/18/2016   Procedure: LEFT KNEE ARTHROSCOPY, CHONDROPLASTY AND PARTIAL MEDIAL AND LATERAL MENISCECTOMY;  Surgeon: Sheral Apleyimothy D Murphy, MD;  Location: [facility];  Service: Orthopedics;  Laterality: Left;  . KNEE SURGERY Right   . MRI    . nuclear stress test    . TONSILLECTOMY    . WRIST ARTHROSCOPY WITH ULNA SHORTENING Right 07/06/2015   Procedure: RIGHT WRIST WITH  ARTHROSCOPIC DEBRIDEMENT AND ULNAR SHORTENING OSTEOTOMY;  Surgeon: Mack Hookavid Thompson, MD;  Location: [facility];  Service:  Orthopedics;  Laterality: Right;    Social History   Social History   Tobacco Use  . Smoking status: Never Smoker  . Smokeless tobacco: Never Used  Substance Use Topics  . Alcohol use: Yes    Alcohol/week: 3.6 oz    Types: 6 Glasses of wine per week    Comment: social  . Drug use: No    Medications   Prior to Admission medications   Medication Sig Start Date End Date Taking? Authorizing Provider  aspirin EC 81 MG tablet Take 1 tablet (81 mg total) by mouth daily. 08/18/16  Yes Martensen, Lucretia KernHenry Calvin III, PA-C  gabapentin (NEURONTIN) 300 MG capsule TAKE 1 CAPSULE BY MOUTH EVERYDAY AT BEDTIME 08/20/17  Yes [provider]  ibuprofen (ADVIL,MOTRIN) 600 MG tablet Take 600 mg by mouth every 6 (six) hours as needed for moderate pain.    Yes [provider]  ketorolac (TORADOL) 10 MG tablet Take 10 mg by mouth every 6 (six) hours as needed for moderate pain.  09/13/17  Yes [provider]  Multiple Vitamin (MULTIVITAMIN WITH MINERALS) TABS tablet Take 1 tablet by mouth daily.   Yes [provider]  omeprazole (PRILOSEC) 20 MG capsule Take 20 mg by mouth daily.   Yes [provider]  simvastatin (ZOCOR) 40 MG tablet Take 40 mg by mouth every evening.  08/18/17  Yes [provider]  zolpidem (AMBIEN CR) 6.25 MG CR tablet 1 TABLET AT BEDTIME AS  NEEDED ONCE A DAY (MUST LAST 30 DAYS) ORALLY 30 08/22/17  Yes [provider]  baclofen (LIORESAL) 10 MG tablet Take 1 tablet (10 mg total) by mouth 3 (three) times daily as needed for muscle spasms. Patient not taking: Reported on 03/13/2017 08/18/16   Albina Billet III, PA-C  HYDROcodone-acetaminophen (NORCO/VICODIN) 5-325 MG tablet Take 1-2 tablets by mouth every 4 (four) hours as needed. Patient not taking: Reported on 12/31/2017 03/13/17   Rolan Bucco, MD  ondansetron (ZOFRAN) 4 MG tablet Take 1 tablet (4 mg total) by mouth every 8 (eight) hours as needed for nausea or  vomiting. Patient not taking: Reported on 12/31/2017 08/18/16   Albina Billet III, PA-C  oxyCODONE-acetaminophen (ROXICET) 5-325 MG tablet Take 1-2 tablets by mouth every 4 (four) hours as needed for severe pain. Patient not taking: Reported on 12/31/2017 08/18/16   Albina Billet III, PA-C  zolpidem (AMBIEN) 5 MG tablet Take 1 tablet (5 mg total) by mouth at bedtime as needed for sleep. Patient not taking: Reported on 12/31/2017 09/23/12   Gwyneth Sprout, MD    Allergies   Allergies  Allergen Reactions  . Sudafed [Pseudoephedrine] Anxiety and Other (See Comments)    Anxious, unable to urinate,"jittery"    Review of Systems  ROS  Neurologic Exam  Awake, alert, oriented Memory and concentration grossly intact Speech fluent, appropriate CN grossly intact Motor exam: Upper Extremities Deltoid Bicep Tricep Grip  Right 5/5 5/5 5/5 5/5  Left 5/5 5/5 5/5 5/5   Lower Extremities IP Quad PF DF EHL  Right 4+/5 4+/5 5/5 4+/5 4+/5  Left 5/5 5/5 5/5 5/5 5/5   Sensation grossly intact to LT  Imaging  MRI of the lumbar spine dated 11/29/2017 was reviewed and compared to prior MRI of 07/12/2017. This again demonstrates significant disc degeneration with loss of height at L3-4 and L4-5 as well as L5-S1. There is significant lateral listhesis of the L3-4 and L4-5levels. As a result, there is significant foraminal stenosis on the right at L3-4 and L4-5. At L5-S1 there is significant primarily left-sided facet arthropathy and left eccentric broad-based disc bulge along with some coronal angulation at this level also which results in primarily left-sided stenosis.   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 He has attempted multiple reasonable conservative treatments with progression of symptoms. He would be a candidate for indirect  decompression with restoration of disc height at L3-4 and L4-5 in attempt also to partially reduce the coronal deformity, as well as decompression at L5-S1 with instrumented stabilization from L3-S1.  Plan  Proceed with diskectomy and interbody fusion with placement of interbody graft via lateral approach at L3-4 and L4-5 followed by a unilateral transforaminal lumbar interbody fusion at L5-S1 on the left with percutaneous instrumented stabilization from L3-S1   We have discussed at length in the office with the patient and his family the treatment options at this point. Risks, benefits, and potential outcomes of surgery were also extensively reviewed. The patient understood our discussion and is willing to proceed with surgical decompression and fusion as a two-stage operation as above. All questions were answered today, and consent was obtained.

## 2018-01-09 ENCOUNTER — Other Ambulatory Visit: Payer: Self-pay

## 2018-01-09 LAB — CBC
HCT: 41.6 % (ref 39.0–52.0)
Hemoglobin: 14 g/dL (ref 13.0–17.0)
MCH: 31.1 pg (ref 26.0–34.0)
MCHC: 33.7 g/dL (ref 30.0–36.0)
MCV: 92.4 fL (ref 78.0–100.0)
Platelets: 156 10*3/uL (ref 150–400)
RBC: 4.5 MIL/uL (ref 4.22–5.81)
RDW: 11.7 % (ref 11.5–15.5)
WBC: 10.1 10*3/uL (ref 4.0–10.5)

## 2018-01-09 LAB — BASIC METABOLIC PANEL
Anion gap: 8 (ref 5–15)
BUN: 17 mg/dL (ref 6–20)
CO2: 25 mmol/L (ref 22–32)
Calcium: 8.8 mg/dL — ABNORMAL LOW (ref 8.9–10.3)
Chloride: 105 mmol/L (ref 101–111)
Creatinine, Ser: 1.1 mg/dL (ref 0.61–1.24)
GFR calc Af Amer: >60 mL/min (ref >60)
GFR calc non Af Amer: >60 mL/min (ref >60)
Glucose, Bld: 147 mg/dL — ABNORMAL HIGH (ref 65–99)
Potassium: 4.5 mmol/L (ref 3.5–5.1)
Sodium: 138 mmol/L (ref 135–145)

## 2018-01-09 LAB — PROTIME-INR
INR: 1.01
Prothrombin Time: 13.2 seconds (ref 11.4–15.2)

## 2018-01-09 LAB — MRSA PCR SCREENING: MRSA BY PCR: NEGATIVE

## 2018-01-09 LAB — APTT: aPTT: 35 s (ref 24–36)

## 2018-01-09 MED FILL — Gelatin Absorbable MT Powder: OROMUCOSAL | Qty: 1 | Status: AC

## 2018-01-09 MED FILL — Thrombin For Soln 5000 Unit: CUTANEOUS | Qty: 5000 | Status: AC

## 2018-01-09 NOTE — Progress Notes (Signed)
Orthopedic Tech Progress Note Patient Details:  Billy KocherWilliam N Erickson 01-21-1946 [id number]  Patient ID: Billy KocherWilliam N Erickson, male   DOB: 01-21-1946, 72 y.o.   MRN: [id number]   Billy Erickson, Billy Erickson 01/09/2018, 9:15 AM Called in bio-tech brace order; spoke with Wylene MenLacey

## 2018-01-09 NOTE — Evaluation (Signed)
Occupational Therapy Evaluation and Discharge Patient Details Name: Billy KocherWilliam N Erickson MRN: [id number] DOB: May 14, 1946 Today's Date: 01/09/2018    History of Present Illness pt is a 72 y/o male with no significant PMH, admitted with back and L buttock pain failing conservative treatment.  Pt s/p anterior fusion L3to L5 and Posterior fusion L3 to S1 with bone allografting.   Clinical Impression   Pt reports he was independent with ADL PTA. Currently pt min assist with ADL and supervision with functional mobility. All back, safety, and ADL education completed with pt and family. Pt planning to d/c home with 24/7 supervision from family. No further acute OT needs identified; signing off at this time. Please re-consult if needs change. Thank you for this referral.    Follow Up Recommendations  No OT follow up;Supervision/Assistance - 24 hour(initially)    Equipment Recommendations  3 in 1 bedside commode    Recommendations for Other Services       Precautions / Restrictions Precautions Precautions: Back Precaution Booklet Issued: Yes (comment) Precaution Comments: Pt able to recall 2/3 precautions. Reviewed all with pt and family. Required Braces or Orthoses: Spinal Brace Spinal Brace: Lumbar corset;Applied in sitting position Restrictions Weight Bearing Restrictions: No      Mobility Bed Mobility        General bed mobility comments: Pt OOB in chair upon arrival.  Transfers Overall transfer level: Needs assistance Equipment used: Rolling walker (2 wheeled) Transfers: Sit to/from Stand Sit to Stand: Supervision         General transfer comment: Good hand placement and technique, supervision for safety    Balance Overall balance assessment: Needs assistance Sitting-balance support: Feet supported;No upper extremity supported Sitting balance-Leahy Scale: Good     Standing balance support: No upper extremity supported;During functional activity Standing balance-Leahy  Scale: Fair Standing balance comment: static standing                           ADL either performed or assessed with clinical judgement   ADL Overall ADL's : Needs assistance/impaired Eating/Feeding: Set up;Sitting   Grooming: Supervision/safety;Standing;Wash/dry hands Grooming Details (indicate cue type and reason): Educated pt on use of 2 cups for oral care Upper Body Bathing: Set up;Supervision/ safety;Sitting   Lower Body Bathing: Minimal assistance;Sit to/from stand   Upper Body Dressing : Set up;Supervision/safety;Sitting Upper Body Dressing Details (indicate cue type and reason): Educated on brace management and wear schedule Lower Body Dressing: Minimal assistance;Sit to/from stand. Pt reports family can assist with LB ADL as needed.   Toilet Transfer: Supervision/safety;Ambulation;Comfort height toilet;Grab bars;RW StatisticianToilet Transfer Details (indicate cue type and reason): Educated on use of 3 in 1 over toilet   Toileting - Clothing Manipulation Details (indicate cue type and reason): Educated on proper technique for peri care without twisting and use of wet wipes   Tub/Shower Transfer Details (indicate cue type and reason): Educated on use of 3 in 1 in shower as a seat Functional mobility during ADLs: Supervision/safety;Rolling walker General ADL Comments: Educated pt on maintaining back precautions during functional activities, log roll for bed mobility, and frequent mobility througout the day upon return home.     Vision         Perception     Praxis      Pertinent Vitals/Pain Pain Assessment: Faces Faces Pain Scale: Hurts little more Pain Location: back Pain Descriptors / Indicators: Discomfort;Sore Pain Intervention(s): Monitored during session     Hand Dominance  Extremity/Trunk Assessment Upper Extremity Assessment Upper Extremity Assessment: Overall WFL for tasks assessed   Lower Extremity Assessment Lower Extremity Assessment: Defer to  PT evaluation   Cervical / Trunk Assessment Cervical / Trunk Assessment: Other exceptions Cervical / Trunk Exceptions: s/p spine sx   Communication Communication Communication: No difficulties   Cognition Arousal/Alertness: Awake/alert Behavior During Therapy: WFL for tasks assessed/performed Overall Cognitive Status: Within Functional Limits for tasks assessed                                     General Comments  SpO2 88-92% on RA with activity; reapplied supplemental O2 at end of session    Exercises     Shoulder Instructions      Home Living Family/patient expects to be discharged to:: Private residence Living Arrangements: Spouse/significant other Available Help at Discharge: Family;Available 24 hours/day Type of Home: House Home Access: Stairs to enter [employer] of Steps: 5 Entrance Stairs-Rails: Right;Left Home Layout: Two level;Bed/bath upstairs Alternate Level Stairs-Number of Steps: flight Alternate Level Stairs-Rails: Right;Left Bathroom Shower/Tub: Producer, television/film/video: Standard     Home Equipment: Environmental consultant - 2 wheels;Cane - single point          Prior Functioning/Environment Level of Independence: Independent                 OT Problem List: Impaired balance (sitting and/or standing);Decreased activity tolerance;Decreased knowledge of use of DME or AE;Decreased knowledge of precautions;Pain      OT Treatment/Interventions:      OT Goals(Current goals can be found in the care plan section) Acute Rehab OT Goals Patient Stated Goal: back independent OT Goal Formulation: All assessment and education complete, DC therapy  OT Frequency:     Barriers to D/C:            Co-evaluation              AM-PAC PT "6 Clicks" Daily Activity     Outcome Measure Help from another person eating meals?: None Help from another person taking care of personal grooming?: A Little Help from another person  toileting, which includes using toliet, bedpan, or urinal?: A Little Help from another person bathing (including washing, rinsing, drying)?: A Little Help from another person to put on and taking off regular upper body clothing?: A Little Help from another person to put on and taking off regular lower body clothing?: A Little 6 Click Score: 19   End of Session Equipment Utilized During Treatment: Rolling walker;Back brace;Oxygen  Activity Tolerance: Patient tolerated treatment well Patient left: in chair;with call bell/phone within reach;with chair alarm set;with family/visitor present  OT Visit Diagnosis: Unsteadiness on feet (R26.81);Pain Pain - part of body: (back)                Time: 1610-9604 OT Time Calculation (min): 23 min Charges:  OT General Charges $OT Visit: 1 Visit OT Evaluation $OT Eval Moderate Complexity: 1 Mod OT Treatments $Self Care/Home Management : 8-22 mins G-Codes:     Sunday Klos A. Brett Albino, M.S., OTR/L Acute Rehab Department: [phone]  Gaye Alken 01/09/2018, 3:29 PM

## 2018-01-09 NOTE — Progress Notes (Signed)
Patient continues to have periods of apnea with CPAP in place. RT came to patient's room. Patient 02 sat quickly increases to upper 90s w/ deep breathing. Patient is given his IS and encouraged to use it. Will continue to monitor.

## 2018-01-09 NOTE — Progress Notes (Signed)
Patient having periods of apnea when sleeping and 02 sat dropping into the 50s and 60s, When patient is woke up and encouraged to take deep breaths, 02 sat would increase to 90s. Notified Dr Adelene IdlerMeyran. Orders received for CPAP and RT is notified.

## 2018-01-09 NOTE — Progress Notes (Signed)
Ortho tech paged for Lumbar brace

## 2018-01-09 NOTE — Progress Notes (Signed)
Patient had 2 episodes of nausea and vomiting requiring Zofran this shift, see EMAR. Patient denies any nausea this morning. Will continue to monitor.

## 2018-01-09 NOTE — Progress Notes (Signed)
Neurosurgery Progress Note  Events noted overnight.    - Hypoxia:  ?underlying sleep apnea vs. Medication   - Nausea/Vomiting: resolved   Feels okay this morning. Has right hip pain and some left hip pain.  Left hip pain is similar to prior to surgery although difficult to tell as he has not been up yet No N/T in legs  Foley removed this am. Has not urinated yet   EXAM:  BP 131/82 (BP Location: Left Arm)   Pulse (!) 102   Temp 98.1 F (36.7 C) (Oral)   Resp 15   Ht 5\' 10"  (1.778 m)   Wt 91.6 kg (202 lb)   SpO2 96%   BMI 28.98 kg/m   Awake, alert, oriented  Speech fluent, appropriate  MAEW with good strength. Mild right hip flexor weakness Incision c/d/i   PLAN Stable this am Pain manageable Right hip weakness not unexpected Will monitor preop left hip pain Will work with therapy today  Hypoxia overnight: ?underlying sleep apnea vs. Medication. Will monitor closely Nausea/vomiting; resolved. Zofran prn.

## 2018-01-09 NOTE — Anesthesia Postprocedure Evaluation (Signed)
Anesthesia Post Note  Patient: Billy Erickson  Procedure(s) Performed: ANTERIOR LATERAL INTERBODY FUSION LUMBAR THREE- LUMBAR FOUR, LUMBAR FOUR- LUMBAR FIVE (Right Flank) LUMBAR PERCUTANEOUS PEDICLE SCREW PLACEMENT LUMBAR THREE-FOUR, LUMBAR FOUR-FIVE, LUMBAR FIVE-SACRAL ONE (N/A Back) APPLICATION OF ROBOTIC ASSISTANCE FOR SPINAL PROCEDURE (N/A ) TRANSFORAMINAL LUMBAR INTERBODY FUSION LUMBAR FIVE-SACRAL ONE (N/A Back)     Patient location during evaluation: PACU Anesthesia Type: General Level of consciousness: awake and patient cooperative Pain management: pain level controlled Vital Signs Assessment: post-procedure vital signs reviewed and stable Respiratory status: spontaneous breathing, nonlabored ventilation, respiratory function stable and patient connected to nasal cannula oxygen Cardiovascular status: blood pressure returned to baseline and stable Postop Assessment: no apparent nausea or vomiting Anesthetic complications: no    Last Vitals:  Vitals:   01/09/18 0753 01/09/18 1141  BP: 131/82 (!) 118/59  Pulse: (!) 102 79  Resp: 15 (!) 25  Temp: 36.7 C 36.7 C  SpO2: 96% 98%    Last Pain:  Vitals:   01/09/18 1141  TempSrc: Oral  PainSc:                  Lina Hitch

## 2018-01-10 ENCOUNTER — Encounter (HOSPITAL_COMMUNITY): Payer: Self-pay | Admitting: Neurosurgery

## 2018-01-10 ENCOUNTER — Inpatient Hospital Stay (HOSPITAL_COMMUNITY): Payer: BC Managed Care – PPO

## 2018-01-10 LAB — CBC
HCT: 44.7 % (ref 39.0–52.0)
Hemoglobin: 14.5 g/dL (ref 13.0–17.0)
MCH: 31.3 pg (ref 26.0–34.0)
MCHC: 32.4 g/dL (ref 30.0–36.0)
MCV: 96.5 fL (ref 78.0–100.0)
Platelets: 165 10*3/uL (ref 150–400)
RBC: 4.63 MIL/uL (ref 4.22–5.81)
RDW: 12.3 % (ref 11.5–15.5)
WBC: 9.1 10*3/uL (ref 4.0–10.5)

## 2018-01-10 LAB — BASIC METABOLIC PANEL
Anion gap: 6 (ref 5–15)
BUN: 15 mg/dL (ref 6–20)
CO2: 30 mmol/L (ref 22–32)
Calcium: 9.3 mg/dL (ref 8.9–10.3)
Chloride: 104 mmol/L (ref 101–111)
Creatinine, Ser: 1.26 mg/dL — ABNORMAL HIGH (ref 0.61–1.24)
GFR calc Af Amer: >60 mL/min (ref >60)
GFR calc non Af Amer: 56 mL/min — ABNORMAL LOW (ref >60)
Glucose, Bld: 135 mg/dL — ABNORMAL HIGH (ref 65–99)
Potassium: 4.3 mmol/L (ref 3.5–5.1)
Sodium: 140 mmol/L (ref 135–145)

## 2018-01-10 MED ORDER — IOPAMIDOL (ISOVUE-370) INJECTION 76%
INTRAVENOUS | Status: AC
  Administered 2018-01-10: 125 mL
  Filled 2018-01-10: qty 125

## 2018-01-10 NOTE — Progress Notes (Addendum)
Reviewed CTA.   - No evidence of PE. Does have b/l lower lobe atelectasis. Encourage use of incentive spirometer.  CBC  - normal  BMP:   - Essentially stable. Slight rise in Cr. Encourage po intake. Will repeat tomorrow am to trend.  Addendum Reviewed above with patient and daughter at bedside. Patient states understanding. Try to limit sedating meds.

## 2018-01-10 NOTE — Progress Notes (Signed)
Neurosurgery Progress Note  Continues to have bouts of hypoxia at night. Was placed on O2 which helped. Denies any shortness of breath, chest pain.  States this a "bad" morning pain wise but yesterday felt similar and as the day progressed, he improved but "wore out" Worked with therapy yesterday but felt unsteady on feet. Does not feel ready to go home just yet  EXAM:  BP (!) 99/52 (BP Location: Left Arm)   Pulse (!) 108   Temp 98 F (36.7 C) (Oral)   Resp (!) 25   Ht 5\' 10"  (1.778 m)   Wt 91.6 kg (202 lb)   SpO2 93%   BMI 28.98 kg/m   Awake, alert, oriented  Speech fluent, appropriate  Seems a little short of breath CN grossly intact  MAEW with good strength. R hip flexor weakness Incision c/d/i  IMPRESSION Stable this am neurologically O2 at rest this am high 80's/low 90s. Tachy >110 during visit this am. Seems a little short of breath. Will have nursing place O2. Will obtain CBC, BMP, stat CTA for PE r/o Otherwise, will continue to work with therapy and on pain management Not ready for d/c

## 2018-01-10 NOTE — Progress Notes (Signed)
Trialed pt off of 02 after assisting pt to chair. Pt desat down into lower 80s. O2 placed back on. Spoke with PA who will order CT. Will continue to monitor.

## 2018-01-10 NOTE — Progress Notes (Addendum)
Late Entry note for Mid day 6/19 Evaluation   Pt admitted with/for lumbar fusion surgery.  He may get by without any follow up therapies.  Pt currently limited functionally due to the problems listed below.  (see problems list.)  Pt will benefit from PT to maximize function and safety to be able to get home safely with available assist.    01/09/18 1204  PT Visit Information  Last PT Received On 01/09/18  Assistance Needed +1  History of Present Illness pt is a 72 y/o male with no significant PMH, admitted with back and L buttock pain failing conservative treatment.  Pt s/p anterior fusion L3to L5 and Posterior fusion L3 to S1 with bone allografting.  Precautions  Precautions Back  Required Braces or Orthoses Spinal Brace  Spinal Brace Lumbar corset;Applied in sitting position  Home Living  Family/patient expects to be discharged to: Private residence  Living Arrangements Spouse/significant other  Available Help at Discharge Family;Available 24 hours/day  Type of Home House  Home Access Stairs to enter  Entrance Stairs-Number of Steps 5  Entrance Stairs-Rails Right;Left  Home Layout Two level;Bed/bath upstairs  Alternate Level Stairs-Number of Steps flight  Alternate Level Stairs-Rails Right;Left  Bathroom Shower/Tub Walk-in shower;Tub only  Engineer, water - 2 wheels;Cane - single point  Prior Function  Level of Independence Independent  Communication  Communication No difficulties  Pain Assessment  Pain Assessment Faces  Faces Pain Scale 6  Pain Location back and L buttock  Pain Descriptors / Indicators Aching;Guarding;Grimacing;Sore  Pain Intervention(s) Monitored during session;Limited activity within patient's tolerance  Cognition  Arousal/Alertness Awake/alert  Behavior During Therapy WFL for tasks assessed/performed  Overall Cognitive Status Within Functional Limits for tasks assessed  Upper Extremity Assessment  Upper Extremity  Assessment Defer to OT evaluation  Lower Extremity Assessment  Lower Extremity Assessment Generalized weakness;Overall WFL for tasks assessed  Bed Mobility  Overal bed mobility Needs Assistance  Bed Mobility Rolling;Sidelying to Sit  Rolling Mod assist  Sidelying to sit Mod assist  General bed mobility comments reinforced technique  Transfers  Overall transfer level Needs assistance  Equipment used Rolling walker (2 wheeled)  Transfers Sit to/from Stand  Sit to Stand Min guard  Ambulation/Gait  Ambulation/Gait assistance Min guard  Gait Distance (Feet) 100 Feet  Assistive device Rolling walker (2 wheeled)  Gait Pattern/deviations Step-through pattern  General Gait Details Generally steady with RW  Balance  Overall balance assessment Needs assistance  Sitting balance-Leahy Scale Fair  Standing balance-Leahy Scale Poor  Standing balance comment reliant on the RW  General Comments  General comments (skin integrity, edema, etc.) Pt and dtr instructed in back prec/care, log roll/transition to/from sitting, lifting restrictions, bracing issues, progression of activity.  PT - End of Session  Activity Tolerance Patient tolerated treatment well;Patient limited by pain  Patient left in chair;with call bell/phone within reach;with family/visitor present  Nurse Communication Mobility status  PT Assessment  PT Recommendation/Assessment Patient needs continued PT services  PT Visit Diagnosis Other abnormalities of gait and mobility (R26.89);Pain  Pain - part of body  (back)  PT Problem List Decreased strength;Decreased activity tolerance;Decreased balance;Decreased mobility;Decreased knowledge of use of DME;Decreased knowledge of precautions;Pain  PT Plan  PT Frequency (ACUTE ONLY) Min 5X/week  PT Treatment/Interventions (ACUTE ONLY) Gait training;Stair training;Functional mobility training;Therapeutic activities;Patient/family education;DME instruction  AM-PAC PT "6 Clicks" Daily Activity  Outcome Measure  Difficulty turning over in bed (including adjusting bedclothes, sheets and blankets)? 1  Difficulty moving from  lying on back to sitting on the side of the bed?  1  Difficulty sitting down on and standing up from a chair with arms (e.g., wheelchair, bedside commode, etc,.)? 1  Help needed moving to and from a bed to chair (including a wheelchair)? 3  Help needed walking in hospital room? 3  Help needed climbing 3-5 steps with a railing?  3  6 Click Score 12  Mobility G Code  CL  PT Recommendation  Follow Up Recommendations No PT follow up;Supervision/Assistance - 24 hour  PT equipment None recommended by PT  Individuals Consulted  Consulted and Agree with Results and Recommendations Patient  Acute Rehab PT Goals  Patient Stated Goal back independent  PT Goal Formulation With patient  Time For Goal Achievement 01/16/18  Potential to Achieve Goals Good  PT Time Calculation  PT Start Time (ACUTE ONLY) 1126  PT Stop Time (ACUTE ONLY) 1205  PT Time Calculation (min) (ACUTE ONLY) 39 min  PT General Charges  $$ ACUTE PT VISIT 1 Visit  PT Evaluation  $PT Eval Low Complexity 1 Low  PT Treatments  $Gait Training 8-22 mins  $Therapeutic Activity 8-22 mins    01/10/2018  Uhland BingKen Carliss Porcaro, PT [phone]  (pager)

## 2018-01-10 NOTE — Progress Notes (Signed)
Physical Therapy Treatment Patient Details Name: Billy KocherWilliam N Plaskett MRN: [id number] DOB: 06-15-1946 Today's Date: 01/10/2018    History of Present Illness pt is a 72 y/o male with no significant PMH, admitted with back and L buttock pain failing conservative treatment.  Pt s/p anterior fusion L3to L5 and Posterior fusion L3 to S1 with bone allografting.    PT Comments    Pt not feeling well today, very lethargic and painful with some fever.  Emphasis on education with wife/pt and getting OOB.  Pt unable to ambulate well, but stayed in the chair.    Follow Up Recommendations  Other (comment);No PT follow up(pt not progressing as well as yesterday, pt/wife may ask for some HHPT follow)     Equipment Recommendations  None recommended by PT    Recommendations for Other Services       Precautions / Restrictions Precautions Precautions: Back Precaution Booklet Issued: Yes (comment) Precaution Comments: Pt able to recall 2/3 precautions. Reviewed all with pt and family. Required Braces or Orthoses: Spinal Brace Spinal Brace: Lumbar corset;Applied in sitting position Restrictions Weight Bearing Restrictions: No    Mobility  Bed Mobility Overal bed mobility: Needs Assistance Bed Mobility: Rolling;Sidelying to Sit Rolling: Min assist Sidelying to sit: Min guard       General bed mobility comments: reinforced technique.  Assisted the roll, but transition to sit occurred with assist of the rail only  Transfers Overall transfer level: Needs assistance Equipment used: Rolling walker (2 wheeled) Transfers: Sit to/from Stand Sit to Stand: Min assist         General transfer comment: cues for hand placement.  Ambulation/Gait Ambulation/Gait assistance: Min assist Gait Distance (Feet): 8 Feet Assistive device: Rolling walker (2 wheeled)       General Gait Details: significantly limited due to quad, buttock and back pain.   Stairs             Wheelchair Mobility     Modified Rankin (Stroke Patients Only)       Balance   Sitting-balance support: Feet supported;No upper extremity supported Sitting balance-Leahy Scale: Good(to fair)     Standing balance support: Bilateral upper extremity supported Standing balance-Leahy Scale: Poor Standing balance comment: reliant on the RW today.                            Cognition Arousal/Alertness: Awake/alert Behavior During Therapy: WFL for tasks assessed/performed Overall Cognitive Status: Within Functional Limits for tasks assessed                                        Exercises      General Comments General comments (skin integrity, edema, etc.): pt and his wife were reinforced in all back education      Pertinent Vitals/Pain Pain Assessment: Faces Faces Pain Scale: Hurts whole lot Pain Location: back, quads, buttocks Pain Descriptors / Indicators: Discomfort;Sore Pain Intervention(s): Monitored during session;Premedicated before session    Home Living                      Prior Function            PT Goals (current goals can now be found in the care plan section) Acute Rehab PT Goals Patient Stated Goal: back independent PT Goal Formulation: With patient Time For Goal Achievement: 01/16/18 Potential to  Achieve Goals: Good Progress towards PT goals: Progressing toward goals    Frequency    Min 5X/week      PT Plan Current plan remains appropriate    Co-evaluation              AM-PAC PT "6 Clicks" Daily Activity  Outcome Measure  Difficulty turning over in bed (including adjusting bedclothes, sheets and blankets)?: Unable Difficulty moving from lying on back to sitting on the side of the bed? : Unable Difficulty sitting down on and standing up from a chair with arms (e.g., wheelchair, bedside commode, etc,.)?: Unable Help needed moving to and from a bed to chair (including a wheelchair)?: A Little Help needed walking in  hospital room?: A Little Help needed climbing 3-5 steps with a railing? : A Little 6 Click Score: 12    End of Session Equipment Utilized During Treatment: Back brace Activity Tolerance: Patient limited by pain;Patient limited by fatigue;Patient limited by lethargy Patient left: in chair;with call bell/phone within reach;with family/visitor present Nurse Communication: Mobility status PT Visit Diagnosis: Other abnormalities of gait and mobility (R26.89);Pain Pain - part of body: (back, quads, buttock)     Time: 1308-6578 PT Time Calculation (min) (ACUTE ONLY): 32 min  Charges:  $Therapeutic Activity: 8-22 mins $Self Care/Home Management: 8-22                    G Codes:       January 14, 2018  Corning Bing, PT [phone]  (pager)   Eliseo Gum [facility] [place] January 14, 2018, 5:39 PM

## 2018-01-10 NOTE — Progress Notes (Signed)
Patient 02 sat maintaining 98-100% on 2 L [place]. Decreased 02 to 1L and 02 sat is 98%. Will continue to monitor.

## 2018-01-11 LAB — BASIC METABOLIC PANEL
Anion gap: 7 (ref 5–15)
BUN: 14 mg/dL (ref 6–20)
CO2: 25 mmol/L (ref 22–32)
Calcium: 8.7 mg/dL — ABNORMAL LOW (ref 8.9–10.3)
Chloride: 105 mmol/L (ref 101–111)
Creatinine, Ser: 1.01 mg/dL (ref 0.61–1.24)
GFR calc Af Amer: >60 mL/min (ref >60)
GFR calc non Af Amer: >60 mL/min (ref >60)
Glucose, Bld: 123 mg/dL — ABNORMAL HIGH (ref 65–99)
Potassium: 4.3 mmol/L (ref 3.5–5.1)
Sodium: 137 mmol/L (ref 135–145)

## 2018-01-11 MED ORDER — METHOCARBAMOL 500 MG PO TABS: 500.0000 mg | ORAL_TABLET | Freq: Four times a day (QID) | ORAL | 2 refills | Status: AC | PRN

## 2018-01-11 MED ORDER — OXYCODONE-ACETAMINOPHEN 7.5-325 MG PO TABS: 1.0000 | ORAL_TABLET | ORAL | 0 refills | Status: DC | PRN

## 2018-01-11 MED ORDER — ASPIRIN EC 81 MG PO TBEC: 81.0000 mg | DELAYED_RELEASE_TABLET | Freq: Every day | ORAL | 0 refills | Status: DC

## 2018-01-11 NOTE — Progress Notes (Signed)
Pt discharged home this shift. Daughter present for discharge instructions. Pt transported in wheelchair and transferred in the daughter personal vehicle. No incident noted.

## 2018-01-11 NOTE — Care Management Note (Signed)
Case Management Note  Patient Details  Name: Billy Erickson MRN: [id number] Date of Birth: 05/13/1946  Subjective/Objective:      Pt s/p anterior fusion L3to L5 and Posterior fusion L3 to S1 with bone allografting on 01/08/18.  PTA, pt independent, lives with spouse.               Action/Plan: PT recommending no OP follow up, BSC for home.  Pt declined need for BSC.  Family available to provide care at dc.    Expected Discharge Date:  01/11/18               Expected Discharge Plan:  Home/Self Care  In-House Referral:     Discharge planning Services  CM Consult  Post Acute Care Choice:  NA Choice offered to:     DME Arranged:  Patient refused services DME Agency:     HH [facility]  [facility]:     Status of Service:  Completed, signed off  If discussed at Long Length of Stay Meetings, dates discussed:    Additional Comments:  Billy Erickson, Billy Napp M, RN 01/11/2018, 12:12 PM

## 2018-01-11 NOTE — Discharge Summary (Signed)
Physician Discharge Summary  Patient ID: Billy Erickson MRN: [id number] DOB/AGE: 1945/10/29 72 y.o.  Admit date: 01/08/2018 Discharge date: 01/11/2018  Admission Diagnoses:  Lumbar radiculopathy  Discharge Diagnoses:  Same Active Problems:   Lumbar radiculopathy   Discharged Condition: Stable  Hospital Course:  Billy Erickson is a 72 y.o. male who was admitted for the below procedure. Post op complicated by nocturnal hypoxia. CTA negative, labs stable. No clear explanation. ?sleep apnea vs drug induced. Will need to f/u outpt with PCP At time of discharge, pain was well controlled, ambulating with Pt/OT, tolerating po, voiding normal. Ready for discharge.  Treatments: Surgery 1.   Posterior segmental instrumentation, L3-S1 2.   Posterior interbody biomechanical device placement L5-S1, NuVasive expandable cage, 8mm lordotic 3.   Posterior interbody arthodesis, L5-S1 4.   Use of locally harvested morcelized bone autograft 5.   Use of non-structural bone allograft - BMP 6.   Use of intraoperative robotic assistance 7.   Use of intraoperative microscope   Discharge Exam: Blood pressure 120/80, pulse 85, temperature 97.9 F (36.6 C), temperature source Oral, resp. rate (!) 25, height 5\' 10"  (1.778 m), weight 91.6 kg (202 lb), SpO2 96 %. Awake, alert, oriented Speech fluent, appropriate CN grossly intact 5/5 BUE/BLE Wound c/d/i  Disposition: Discharge disposition: 01-Home or Self Care       Discharge Instructions    Call MD for:  difficulty breathing, headache or visual disturbances   Complete by:  As directed    Call MD for:  persistant dizziness or light-headedness   Complete by:  As directed    Call MD for:  redness, tenderness, or signs of infection (pain, swelling, redness, odor or green/yellow discharge around incision site)   Complete by:  As directed    Call MD for:  severe uncontrolled pain   Complete by:  As directed    Call MD for:  temperature >100.4    Complete by:  As directed    Diet general   Complete by:  As directed    Driving Restrictions   Complete by:  As directed    Do not drive until given clearance.   Increase activity slowly   Complete by:  As directed    Lifting restrictions   Complete by:  As directed    Do not lift anything >10lbs. Avoid bending and twisting in awkward positions. Avoid bending at the back.   May shower / Bathe   Complete by:  As directed    In 24 hours. Okay to wash wound with warm soapy water. Avoid scrubbing the wound. Pat dry.   Remove dressing in 24 hours   Complete by:  As directed      Allergies as of 01/11/2018      Reactions   Sudafed [pseudoephedrine] Anxiety, Other (See Comments)   Anxious, unable to urinate,"jittery"      Medication List    STOP taking these medications   baclofen 10 MG tablet Commonly known as:  LIORESAL   HYDROcodone-acetaminophen 5-325 MG tablet Commonly known as:  NORCO/VICODIN   ondansetron 4 MG tablet Commonly known as:  ZOFRAN   oxyCODONE-acetaminophen 5-325 MG tablet Commonly known as:  ROXICET Replaced by:  oxyCODONE-acetaminophen 7.5-325 MG tablet     TAKE these medications   aspirin EC 81 MG tablet Take 1 tablet (81 mg total) by mouth daily. Start taking on:  01/15/2018 What changed:  These instructions start on 01/15/2018. If you are unsure what to do until then,  ask your doctor or other care provider.   gabapentin 300 MG capsule Commonly known as:  NEURONTIN TAKE 1 CAPSULE BY MOUTH EVERYDAY AT BEDTIME   ibuprofen 600 MG tablet Commonly known as:  ADVIL,MOTRIN Take 600 mg by mouth every 6 (six) hours as needed for moderate pain.   ketorolac 10 MG tablet Commonly known as:  TORADOL Take 10 mg by mouth every 6 (six) hours as needed for moderate pain.   methocarbamol 500 MG tablet Commonly known as:  ROBAXIN Take 1 tablet (500 mg total) by mouth every 6 (six) hours as needed for muscle spasms.   multivitamin with minerals Tabs  tablet Take 1 tablet by mouth daily.   omeprazole 20 MG capsule Commonly known as:  PRILOSEC Take 20 mg by mouth daily.   oxyCODONE-acetaminophen 7.5-325 MG tablet Commonly known as:  PERCOCET Take 1 tablet by mouth every 4 (four) hours as needed. Replaces:  oxyCODONE-acetaminophen 5-325 MG tablet   simvastatin 40 MG tablet Commonly known as:  ZOCOR Take 40 mg by mouth every evening.   zolpidem 6.25 MG CR tablet Commonly known as:  AMBIEN CR 1 TABLET AT BEDTIME AS NEEDED ONCE A DAY (MUST LAST 30 DAYS) ORALLY 30 What changed:  Another medication with the same name was removed. Continue taking this medication, and follow the directions you see here.            Durable Medical Equipment  (From admission, onward)        Start     Ordered   01/11/18 0813  DME 3-in-1  Once     01/11/18 0813     Follow-up Information    Lisbeth Renshaw, MD. Schedule an appointment as soon as possible for a visit in 3 week(s).   Specialty:  Neurosurgery Contact information: [address] [place] [address] [phone]           Signed: Alyson Ingles 01/11/2018, 8:14 AM

## 2018-01-11 NOTE — Progress Notes (Signed)
Physical Therapy Treatment Patient Details Name: Billy Erickson MRN: [id number] DOB: [birth year] Today's Date: 01/11/2018    History of Present Illness pt is a 72 y/o male with no significant PMH, admitted with back and L buttock pain failing conservative treatment.  Pt s/p anterior fusion L3to L5 and Posterior fusion L3 to S1 with bone allografting.    PT Comments    Pt scheduled for d/c home today. Stair training complete. Daughter present during session. Pt and daughter verbalize no further questions/concerns regarding mobility.   Follow Up Recommendations  No PT follow up     Equipment Recommendations  None recommended by PT    Recommendations for Other Services       Precautions / Restrictions Precautions Precautions: Back Precaution Comments: Pt able to recall 2/3 precautions. Reviewed all with pt and family. Required Braces or Orthoses: Spinal Brace Spinal Brace: Lumbar corset;Applied in sitting position    Mobility  Bed Mobility Overal bed mobility: Needs Assistance Bed Mobility: Sit to Sidelying;Rolling Rolling: Modified independent (Device/Increase time)       Sit to sidelying: Supervision General bed mobility comments: Pt demo good technique with logroll.  Transfers Overall transfer level: Needs assistance Equipment used: Rolling walker (2 wheeled) Transfers: Sit to/from Stand Sit to Stand: Min guard         General transfer comment: increased time and effort  Ambulation/Gait Ambulation/Gait assistance: Min guard Gait Distance (Feet): 50 Feet(x 2) Assistive device: Rolling walker (2 wheeled) Gait Pattern/deviations: Step-through pattern;Decreased stride length Gait velocity: decreased Gait velocity interpretation: <1.31 ft/sec, indicative of household ambulator General Gait Details: cues for posture   Stairs Stairs: Yes Stairs assistance: Min guard Stair Management: One rail Right;Sideways Number of Stairs: 10     Wheelchair Mobility     Modified Rankin (Stroke Patients Only)       Balance   Sitting-balance support: Feet supported;No upper extremity supported Sitting balance-Leahy Scale: Good     Standing balance support: Bilateral upper extremity supported Standing balance-Leahy Scale: Fair                              Cognition Arousal/Alertness: Awake/alert Behavior During Therapy: WFL for tasks assessed/performed Overall Cognitive Status: Within Functional Limits for tasks assessed                                        Exercises      General Comments        Pertinent Vitals/Pain Pain Assessment: 0-10 Pain Score: 3  Pain Location: back and RLE Pain Descriptors / Indicators: Sore;Grimacing;Guarding Pain Intervention(s): Monitored during session;Premedicated before session    Home Living                      Prior Function            PT Goals (current goals can now be found in the care plan section) Acute Rehab PT Goals Patient Stated Goal: back independent PT Goal Formulation: With patient Time For Goal Achievement: 01/16/18 Potential to Achieve Goals: Good Progress towards PT goals: Progressing toward goals    Frequency           PT Plan Current plan remains appropriate    Co-evaluation              AM-PAC PT "6 Clicks" Daily Activity  Outcome Measure  Difficulty turning over in bed (including adjusting bedclothes, sheets and blankets)?: A Little Difficulty moving from lying on back to sitting on the side of the bed? : A Lot Difficulty sitting down on and standing up from a chair with arms (e.g., wheelchair, bedside commode, etc,.)?: A Lot Help needed moving to and from a bed to chair (including a wheelchair)?: A Little Help needed walking in hospital room?: A Little Help needed climbing 3-5 steps with a railing? : A Little 6 Click Score: 16    End of Session Equipment Utilized During Treatment: Gait belt;Back brace Activity  Tolerance: Patient tolerated treatment well Patient left: in bed;with call bell/phone within reach;with family/visitor present Nurse Communication: Mobility status PT Visit Diagnosis: Other abnormalities of gait and mobility (R26.89);Pain     Time: [id number] PT Time Calculation (min) (ACUTE ONLY): 25 min  Charges:  $Gait Training: 8-22 mins $Therapeutic Activity: 8-22 mins                    G Codes:       Aida RaiderWendy Sincere Liuzzi, PT  Office # [phone] Pager [phone]    Ilda FoilGarrow, Demetries Coia Rene 01/11/2018, 10:17 AM

## 2018-01-14 DIAGNOSIS — R300 Dysuria: Secondary | ICD-10-CM | POA: Diagnosis not present

## 2018-03-08 DIAGNOSIS — M6281 Muscle weakness (generalized): Secondary | ICD-10-CM | POA: Diagnosis not present

## 2018-03-08 DIAGNOSIS — M545 Low back pain: Secondary | ICD-10-CM | POA: Diagnosis not present

## 2018-03-13 DIAGNOSIS — M545 Low back pain: Secondary | ICD-10-CM | POA: Diagnosis not present

## 2018-03-13 DIAGNOSIS — M6281 Muscle weakness (generalized): Secondary | ICD-10-CM | POA: Diagnosis not present

## 2018-03-20 DIAGNOSIS — M6281 Muscle weakness (generalized): Secondary | ICD-10-CM | POA: Diagnosis not present

## 2018-03-20 DIAGNOSIS — M545 Low back pain: Secondary | ICD-10-CM | POA: Diagnosis not present

## 2018-03-22 DIAGNOSIS — Z23 Encounter for immunization: Secondary | ICD-10-CM | POA: Diagnosis not present

## 2018-03-27 DIAGNOSIS — M545 Low back pain: Secondary | ICD-10-CM | POA: Diagnosis not present

## 2018-03-27 DIAGNOSIS — M6281 Muscle weakness (generalized): Secondary | ICD-10-CM | POA: Diagnosis not present

## 2018-04-18 ENCOUNTER — Emergency Department (HOSPITAL_COMMUNITY): Payer: Medicare Other

## 2018-04-18 ENCOUNTER — Encounter (HOSPITAL_COMMUNITY): Payer: Self-pay

## 2018-04-18 ENCOUNTER — Other Ambulatory Visit: Payer: Self-pay

## 2018-04-18 ENCOUNTER — Emergency Department (HOSPITAL_COMMUNITY)
Admission: EM | Admit: 2018-04-18 | Discharge: 2018-04-18 | Disposition: A | Discharge status: Home / Self Care | Payer: Medicare Other | Attending: Emergency Medicine | Admitting: Emergency Medicine

## 2018-04-18 DIAGNOSIS — Z7982 Long term (current) use of aspirin: Secondary | ICD-10-CM | POA: Insufficient documentation

## 2018-04-18 DIAGNOSIS — R079 Chest pain, unspecified: Secondary | ICD-10-CM | POA: Diagnosis not present

## 2018-04-18 DIAGNOSIS — M436 Torticollis: Secondary | ICD-10-CM | POA: Diagnosis not present

## 2018-04-18 DIAGNOSIS — Z79899 Other long term (current) drug therapy: Secondary | ICD-10-CM | POA: Insufficient documentation

## 2018-04-18 DIAGNOSIS — R51 Headache: Secondary | ICD-10-CM | POA: Insufficient documentation

## 2018-04-18 DIAGNOSIS — R05 Cough: Secondary | ICD-10-CM | POA: Diagnosis not present

## 2018-04-18 DIAGNOSIS — M542 Cervicalgia: Secondary | ICD-10-CM | POA: Diagnosis not present

## 2018-04-18 DIAGNOSIS — R0602 Shortness of breath: Secondary | ICD-10-CM | POA: Diagnosis not present

## 2018-04-18 DIAGNOSIS — R5383 Other fatigue: Secondary | ICD-10-CM | POA: Diagnosis not present

## 2018-04-18 LAB — CBC WITH DIFFERENTIAL/PLATELET
Basophils Absolute: 0 10*3/uL (ref 0.0–0.1)
Basophils Relative: 1 %
EOS PCT: 2 %
Eosinophils Absolute: 0.1 10*3/uL (ref 0.0–0.7)
HEMATOCRIT: 43.9 % (ref 39.0–52.0)
Hemoglobin: 14.9 g/dL (ref 13.0–17.0)
LYMPHS ABS: 1.3 10*3/uL (ref 0.7–4.0)
LYMPHS PCT: 20 %
MCH: 31.1 pg (ref 26.0–34.0)
MCHC: 33.9 g/dL (ref 30.0–36.0)
MCV: 91.6 fL (ref 78.0–100.0)
MONO ABS: 1.1 10*3/uL — AB (ref 0.1–1.0)
Monocytes Relative: 17 %
NEUTROS ABS: 3.8 10*3/uL (ref 1.7–7.7)
Neutrophils Relative %: 60 %
PLATELETS: 182 10*3/uL (ref 150–400)
RBC: 4.79 MIL/uL (ref 4.22–5.81)
RDW: 12.6 % (ref 11.5–15.5)
WBC: 6.3 10*3/uL (ref 4.0–10.5)

## 2018-04-18 LAB — BASIC METABOLIC PANEL
ANION GAP: 9 (ref 5–15)
BUN: 24 mg/dL — AB (ref 8–23)
CHLORIDE: 107 mmol/L (ref 98–111)
CO2: 26 mmol/L (ref 22–32)
Calcium: 9.9 mg/dL (ref 8.9–10.3)
Creatinine, Ser: 1.23 mg/dL (ref 0.61–1.24)
GFR calc Af Amer: >60 mL/min (ref >60)
GFR calc non Af Amer: 58 mL/min — ABNORMAL LOW (ref >60)
GLUCOSE: 94 mg/dL (ref 70–99)
POTASSIUM: 4.2 mmol/L (ref 3.5–5.1)
Sodium: 142 mmol/L (ref 135–145)

## 2018-04-18 LAB — CSF CELL COUNT WITH DIFFERENTIAL
RBC Count, CSF: 1 /mm3 — ABNORMAL HIGH
Tube #: 4
WBC, CSF: 0 /mm3 (ref 0–5)

## 2018-04-18 LAB — PROTIME-INR
INR: 0.89
Prothrombin Time: 11.9 seconds (ref 11.4–15.2)

## 2018-04-18 LAB — GLUCOSE, CSF: Glucose, CSF: 66 mg/dL (ref 40–70)

## 2018-04-18 LAB — PROTEIN, CSF: Total  Protein, CSF: 46 mg/dL — ABNORMAL HIGH (ref 15–45)

## 2018-04-18 MED ORDER — SODIUM CHLORIDE 0.9 % IV BOLUS
500.0000 mL | Freq: Once | INTRAVENOUS | Status: AC
  Administered 2018-04-18: 500 mL via INTRAVENOUS

## 2018-04-18 MED ORDER — ACETAMINOPHEN 325 MG PO TABS
650.0000 mg | ORAL_TABLET | ORAL | Status: DC | PRN
  Administered 2018-04-18: 650 mg via ORAL
  Filled 2018-04-18: qty 2

## 2018-04-18 MED ORDER — FENTANYL CITRATE (PF) 100 MCG/2ML IJ SOLN
50.0000 ug | Freq: Once | INTRAMUSCULAR | Status: AC
  Administered 2018-04-18: 50 ug via INTRAVENOUS
  Filled 2018-04-18: qty 2

## 2018-04-18 MED ORDER — LIDOCAINE HCL 1 % IJ SOLN
INTRAMUSCULAR | Status: AC
  Filled 2018-04-18: qty 20

## 2018-04-18 NOTE — ED Notes (Signed)
Aundra Millet, RN/charge nurse notified that the patient was sent to the ED to rule out meningitis.

## 2018-04-18 NOTE — ED Notes (Signed)
Bed: ZO10 Expected date:  Expected time:  Means of arrival:  Comments: triage2

## 2018-04-18 NOTE — Procedures (Signed)
CLINICAL DATA: [Headache and stiff neck.  Concern for meningitis.] EXAM: DIAGNOSTIC LUMBAR PUNCTURE UNDER FLUOROSCOPIC GUIDANCE FLUOROSCOPY TIME: Radiation Exposure Index (as provided by the fluoroscopic device): [34.8 mGy]  If the device does not provide the exposure index: Fluoroscopy Time (in minutes and seconds): []  Number of Acquired Images: [1] PROCEDURE: Informed consent was obtained from the patient prior to the procedure, including potential complications of headache, allergy, and pain. With the patient prone, the lower back was prepped with Betadine. 1% Lidocaine was used for local anesthesia. Lumbar puncture was performed at the [L3-L4] level using a [20] gauge needle with return of [clear] CSF with an opening pressure of [15] cm water. [Eleven] ml of CSF were obtained for laboratory studies. The patient tolerated the procedure well and there were no apparent complications.  IMPRESSION: Successful lumbar puncture.

## 2018-04-18 NOTE — ED Provider Notes (Signed)
[facility]-EMERGENCY DEPT Provider Note   CSN: [id number] Arrival date & time: 04/18/18  1429     History   Chief Complaint Chief Complaint  Patient presents with  . Headache  . Torticollis    HPI Billy Erickson is a 72 y.o. male.  HPI Patient presents with neck pain and headache.  Began severely last night.  Has had a cough for the last few days.  States pain was severe last night.  Has not had pains like this before.  Has been on both the right and left side of the neck.  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  Patient saw his PCP today and states he needed to come into the ER to get ruled out for meningitis.  No fevers.  Has had a cough with some mild sputum production.  Previous lumbar spinal surgery. Past Medical History:  Diagnosis Date  . Aortic regurgitation    mild to moderate AR 05/29/16 echo [facility])  . Arthritis   . Dyspnea    with exercise only  . Enlarged prostate   . GERD (gastroesophageal reflux disease)   . History of kidney stones   . Hyperlipidemia   . Kidney stones   . Liver cyst   . Wrist pain, right     Patient Active Problem List   Diagnosis Date Noted  . Lumbar radiculopathy 01/08/2018  . Abnormal cardiovascular stress test 05/23/2016  . Dyspnea 05/23/2016    Past Surgical History:  Procedure Laterality Date  . ANTERIOR CRUCIATE LIGAMENT REPAIR    . ANTERIOR LAT LUMBAR FUSION Right 01/08/2018   Procedure: ANTERIOR LATERAL INTERBODY FUSION LUMBAR THREE- LUMBAR FOUR, LUMBAR FOUR- LUMBAR FIVE;  Surgeon: Lisbeth Renshaw, MD;  [facility];  Service: Neurosurgery;  Laterality: Right;  LUMBAR INTERBODY FUSION  . APPLICATION OF ROBOTIC ASSISTANCE FOR SPINAL PROCEDURE N/A 01/08/2018   Procedure: APPLICATION OF ROBOTIC ASSISTANCE FOR SPINAL PROCEDURE;  Surgeon: Lisbeth Renshaw, MD;  [facility];  Service: Neurosurgery;  Laterality: N/A;  APPLICATION OF ROBOTIC  ASSISTANCE FOR SPINAL PROCEDURE  . CYSTOSCOPY/RETROGRADE/URETEROSCOPY  02/13/2008   basketing of fragments  . EXTRACORPOREAL SHOCK WAVE LITHOTRIPSY Left 03/19/2017   Procedure: LEFT EXTRACORPOREAL SHOCK WAVE LITHOTRIPSY (ESWL);  Surgeon: Crist Fat, MD;  [facility]: [facility];  Service: Urology;  Laterality: Left;  . KNEE ARTHROSCOPY WITH MEDIAL MENISECTOMY Left 08/18/2016   Procedure: LEFT KNEE ARTHROSCOPY, CHONDROPLASTY AND PARTIAL MEDIAL AND LATERAL MENISCECTOMY;  Surgeon: Sheral Apley, MD;  Location: [facility];  Service: Orthopedics;  Laterality: Left;  . KNEE SURGERY Right   . LUMBAR PERCUTANEOUS PEDICLE SCREW 3 LEVEL N/A 01/08/2018   Procedure: LUMBAR PERCUTANEOUS PEDICLE SCREW PLACEMENT LUMBAR THREE-FOUR, LUMBAR FOUR-FIVE, LUMBAR FIVE-SACRAL ONE;  Surgeon: Lisbeth Renshaw, MD;  [facility];  Service: Neurosurgery;  Laterality: N/A;  . MRI    . nuclear stress test    . TONSILLECTOMY    . TRANSFORAMINAL LUMBAR INTERBODY FUSION (TLIF) WITH PEDICLE SCREW FIXATION 1 LEVEL N/A 01/08/2018   Procedure: TRANSFORAMINAL LUMBAR INTERBODY FUSION LUMBAR FIVE-SACRAL ONE;  Surgeon: Lisbeth Renshaw, MD;  [facility];  Service: Neurosurgery;  Laterality: N/A;  . WRIST ARTHROSCOPY WITH ULNA SHORTENING Right 07/06/2015   Procedure: RIGHT WRIST WITH  ARTHROSCOPIC DEBRIDEMENT AND ULNAR SHORTENING OSTEOTOMY;  Surgeon: Mack Hook, MD;  Location: [facility];  Service: Orthopedics;  Laterality: Right;        Home Medications    Prior to  Admission medications   Medication Sig Start Date End Date Taking? Authorizing Provider  aspirin EC 81 MG tablet Take 1 tablet (81 mg total) by mouth daily. 01/15/18  Yes Costella, Darci Current, PA-C  gabapentin (NEURONTIN) 300 MG capsule Take 300 mg by mouth at bedtime.  08/20/17  Yes [provider]  HYDROcodone-acetaminophen (NORCO/VICODIN) 5-325 MG tablet Take 0.5 tablets by mouth every 4 (four) hours as needed for  moderate pain or severe pain. for pain 02/12/18  Yes [provider]  ibuprofen (ADVIL,MOTRIN) 100 MG tablet Take 200 mg by mouth every 6 (six) hours as needed for pain or fever.   Yes [provider]  methocarbamol (ROBAXIN) 500 MG tablet Take 1 tablet (500 mg total) by mouth every 6 (six) hours as needed for muscle spasms. 01/11/18  Yes Costella, Darci Current, PA-C  Multiple Vitamin (MULTIVITAMIN WITH MINERALS) TABS tablet Take 1 tablet by mouth daily.   Yes [provider]  omeprazole (PRILOSEC) 20 MG capsule Take 20 mg by mouth daily.   Yes [provider]  simvastatin (ZOCOR) 40 MG tablet Take 40 mg by mouth every evening.  08/18/17  Yes [provider]  tamsulosin (FLOMAX) 0.4 MG CAPS capsule Take 0.4 mg by mouth daily. 03/10/18  Yes [provider]  tiZANidine (ZANAFLEX) 4 MG tablet Take 4 mg by mouth every 6 (six) hours as needed for muscle spasms.  01/15/18  Yes [provider]  zolpidem (AMBIEN CR) 6.25 MG CR tablet Take 6.25 mg by mouth at bedtime as needed for sleep.  08/22/17  Yes [provider]  ketorolac (TORADOL) 10 MG tablet Take 10 mg by mouth every 6 (six) hours as needed for moderate pain.  09/13/17   [provider]  oxyCODONE-acetaminophen (PERCOCET) 10-325 MG tablet TAKE 1/2-1 TABLET BY ORAL ROUTE EVERY 4 HOURS AS NEEDED 01/21/18   [provider]  oxyCODONE-acetaminophen (PERCOCET) 7.5-325 MG tablet Take 1 tablet by mouth every 4 (four) hours as needed. Patient not taking: Reported on 04/18/2018 01/11/18   Alyson Ingles, PA-C    Family History Family History  Problem Relation Age of Onset  . Alzheimer's disease Mother     Social History Social History   Tobacco Use  . Smoking status: Never Smoker  . Smokeless tobacco: Never Used  Substance Use Topics  . Alcohol use: Yes    Alcohol/week: 6.0 standard drinks    Types: 6 Glasses of wine per week    Comment: social  . Drug use: No      Allergies   Sudafed [pseudoephedrine]   Review of Systems Review of Systems  Constitutional: Positive for fatigue. Negative for appetite change and fever.  HENT: Positive for congestion.   Respiratory: Positive for shortness of breath.   Cardiovascular: Negative for chest pain.  Gastrointestinal: Negative for abdominal pain.  Endocrine: Negative for polyuria.  Genitourinary: Negative for flank pain.  Musculoskeletal: Positive for myalgias and neck pain. Negative for back pain.  Skin: Negative for rash.  Neurological: Positive for headaches.  Hematological: Negative for adenopathy.  Psychiatric/Behavioral: Negative for behavioral problems.     Physical Exam Updated Vital Signs BP (!) 145/83   Pulse 71   Temp 98.6 F (37 C) (Oral)   Resp 16   Ht 5\' 11"  (1.803 m)   Wt 89.6 kg   SpO2 95%   BMI 27.55 kg/m   Physical Exam  Constitutional: He appears well-developed.  HENT:  Head: Atraumatic.  Eyes: EOM are normal.  Neck:  Tenderness paraspinal musculature bilaterally.  Somewhat decreased range of motion to left and the right.  No pain with straight leg raise bilaterally.  Cardiovascular: Normal rate.  Pulmonary/Chest: Breath sounds normal.  Abdominal: Soft.  Neurological: He is alert.  Skin: Skin is warm.  Psychiatric: He has a normal mood and affect.     ED Treatments / Results  Labs (all labs ordered are listed, but only abnormal results are displayed) Labs Reviewed  BASIC METABOLIC PANEL - Abnormal; Notable for the following components:      Result Value   BUN 24 (*)    GFR calc non Af Amer 58 (*)    All other components within normal limits  CBC WITH DIFFERENTIAL/PLATELET - Abnormal; Notable for the following components:   Monocytes Absolute 1.1 (*)    All other components within normal limits  PROTEIN, CSF - Abnormal; Notable for the following components:   Total  Protein, CSF 46 (*)    All other components within normal limits  CSF CELL COUNT  WITH DIFFERENTIAL - Abnormal; Notable for the following components:   Appearance, CSF CLEAR (*)    RBC Count, CSF 1 (*)    All other components within normal limits  CSF CULTURE  PROTIME-INR  GLUCOSE, CSF    EKG None  Radiology Dg Chest Portable 1 View  Result Date: 04/18/2018 CLINICAL DATA:  Severe headaches and neck tightness and swelling over the past few days. Pt given fentanyl in ED to help relieve pain. Not taking HTN meds. Not diabetic. Nonsmoker. No hx of asthma or COPD. EXAM: PORTABLE CHEST 1 VIEW COMPARISON:  Chest CTA, 01/10/2018.  Chest radiographs, 08/03/2011. FINDINGS: Heart, mediastinum and hila are within normal limits. Lungs are clear.  No pleural effusion or pneumothorax. Skeletal structures are unremarkable. IMPRESSION: No active disease. Electronically Signed   By: Amie Portland M.D.   On: 04/18/2018 16:22   Dg Lumbar Puncture Fluoro Guide  Result Date: 04/18/2018 CLINICAL DATA:  Headache and stiff neck.  Concern for meningitis. EXAM: DIAGNOSTIC LUMBAR PUNCTURE UNDER FLUOROSCOPIC GUIDANCE FLUOROSCOPY TIME:  Radiation Exposure Index (as provided by the fluoroscopic device): 34.8 mGy If the device does not provide the exposure index: Fluoroscopy Time (in minutes and seconds): Number of Acquired Images:  1 PROCEDURE: Informed consent was obtained from the patient prior to the procedure, including potential complications of headache, allergy, and pain. With the patient prone, the lower back was prepped with Betadine. 1% Lidocaine was used for local anesthesia. Lumbar puncture was performed at the L3-L4 level using a 20 gauge needle with return of clear CSF with an opening pressure of 15 cm water. Eleven ml of CSF were obtained for laboratory studies. The patient tolerated the procedure well and there were no apparent complications. IMPRESSION: Successful lumbar puncture. Electronically Signed   By: Genevive Bi M.D.   On: 04/18/2018 17:34    Procedures Procedures (including  critical care time)  Medications Ordered in ED Medications  lidocaine (XYLOCAINE) 1 % (with pres) injection (has no administration in time range)  acetaminophen (TYLENOL) tablet 650 mg (650 mg Oral Given 04/18/18 1806)  sodium chloride 0.9 % bolus 500 mL (0 mLs Intravenous Stopped 04/18/18 1749)  fentaNYL (SUBLIMAZE) injection 50 mcg (50 mcg Intravenous Given 04/18/18 1600)     Initial Impression / Assessment and Plan / ED Course  I have reviewed the triage vital signs and the nursing notes.  Pertinent labs & imaging results that were available during my care of the  patient were reviewed by me and considered in my medical decision making (see chart for details).     Patient with headache and neck pain.  Sent in by PCP to rule out meningitis including EEE.  Lab work reassuring.  Patient not febrile.  Lumbar puncture done by radiology.  Does not show infection.  Discharge home per  Final Clinical Impressions(s) / ED Diagnoses   Final diagnoses:  Neck pain    ED Discharge Orders    None       Benjiman Core, MD 04/18/18 1911

## 2018-04-18 NOTE — ED Triage Notes (Addendum)
Patient c/o headache and stiff neck that started last night and today he has had an increasingly painful  stiff neck and headache much worse.. Patient went to his PCP and was told to come to the ED to rule out meningitis.  Patient also reports that he has been camping in [place] where [place] Equine Encephalitis has been.

## 2018-04-18 NOTE — ED Notes (Signed)
ED Provider at bedside. 

## 2018-04-18 NOTE — ED Notes (Signed)
Patient transported to IR 

## 2018-04-22 LAB — CSF CULTURE W GRAM STAIN: Culture: NO GROWTH

## 2018-04-22 LAB — CSF CULTURE: GRAM STAIN: NONE SEEN

## 2018-11-04 ENCOUNTER — Telehealth: Payer: Self-pay | Admitting: Cardiovascular Disease

## 2018-11-04 NOTE — Telephone Encounter (Signed)
Spoke with patient who confirmed all demographics. Currently uses webex.  I sent his MyChart link via e-mail.

## 2018-11-05 ENCOUNTER — Telehealth: Payer: Self-pay | Admitting: Cardiovascular Disease

## 2018-11-05 NOTE — Telephone Encounter (Signed)
New Message  [place] from  [place] would like a call in reference to this patient's information in proficient. The ref # in proficient is [id number].

## 2018-11-07 ENCOUNTER — Encounter: Payer: Self-pay | Admitting: Cardiovascular Disease

## 2018-11-07 ENCOUNTER — Other Ambulatory Visit: Payer: Self-pay

## 2018-11-07 ENCOUNTER — Telehealth: Payer: Self-pay | Admitting: Cardiovascular Disease

## 2018-11-07 ENCOUNTER — Telehealth: Payer: Medicare Other | Admitting: Family

## 2018-11-07 ENCOUNTER — Telehealth (INDEPENDENT_AMBULATORY_CARE_PROVIDER_SITE_OTHER): Payer: Medicare Other | Admitting: Cardiovascular Disease

## 2018-11-07 VITALS — BP 128/72 | HR 64 | Ht 70.0 in | Wt 199.0 lb

## 2018-11-07 DIAGNOSIS — R0609 Other forms of dyspnea: Secondary | ICD-10-CM | POA: Diagnosis not present

## 2018-11-07 DIAGNOSIS — R0789 Other chest pain: Secondary | ICD-10-CM | POA: Diagnosis not present

## 2018-11-07 DIAGNOSIS — R0600 Dyspnea, unspecified: Secondary | ICD-10-CM

## 2018-11-07 DIAGNOSIS — Z7189 Other specified counseling: Secondary | ICD-10-CM

## 2018-11-07 MED ORDER — ALBUTEROL SULFATE 108 (90 BASE) MCG/ACT IN AEPB: 2.0000 | INHALATION_SPRAY | Freq: Four times a day (QID) | RESPIRATORY_TRACT | 2 refills | Status: DC | PRN

## 2018-11-07 MED ORDER — ALBUTEROL SULFATE HFA 108 (90 BASE) MCG/ACT IN AERS: 2.0000 | INHALATION_SPRAY | Freq: Four times a day (QID) | RESPIRATORY_TRACT | 2 refills | Status: DC | PRN

## 2018-11-07 NOTE — Progress Notes (Signed)
Based on what you shared with me, I feel your condition warrants further evaluation and I recommend that you be seen for a face to face office visit.     NOTE: If you entered your credit card information for this eVisit, you will not be charged. You may see a "hold" on your card for the $35 but that hold will drop off and you will not have a charge processed.  If you are having a true medical emergency please call 911.  If you need an urgent face to face visit, [place] has four urgent care centers for your convenience.    PLEASE NOTE: THE [facility] LOCATIONS AND URGENT CARE CLINICS DO NOT HAVE THE TESTING FOR CORONAVIRUS COVID19 AVAILABLE.  IF YOU FEEL YOU NEED THIS TEST YOU MUST GO TO A TRIAGE LOCATION AT ONE OF THE HOSPITAL EMERGENCY DEPARTMENTS   [email or web address] to reserve your spot online an avoid wait times  [facility] [address], [address] [place], [place] [address] Modified hours of operation: Monday-Friday, 10 AM to 6 PM  Saturday & Sunday 10 AM to 4 PM *Across the street from [employer]  [facility] (New Address!) [address], [address] [place], [place] [address] *Just off [address], across the road from [employer]* Modified hours of operation: Monday-Friday, 10 AM to 5 PM  Closed Saturday & Sunday   The following sites will take your insurance:  . [facility]  [phone] Get Driving Directions Find a Provider at this Location  [address] [place], [place] [address] . 10 am to 8 pm Monday-Friday . 12 pm to 8 pm Saturday-Sunday   . [facility]  [phone] Get Driving Directions Find a Provider at this Location  [address], [address] [place], [place] [address] . 8 am to 8 pm Monday-Friday . 9 am to 6 pm Saturday . 11 am to 6 pm Sunday   . [facility] [place]  [phone] Get Driving Directions  [address] [place], [place] [address] . 8 am to 8 pm Monday-Friday . 8 am to 4 pm Saturday-Sunday   Your e-visit answers were reviewed by a board certified advanced clinical practitioner to complete your personal care plan.  Thank you for using e-Visits. 

## 2018-11-07 NOTE — Progress Notes (Signed)
Virtual Visit via Video Note   This visit type was conducted due to national recommendations for restrictions regarding the COVID-19 Pandemic (e.g. social distancing) in an effort to limit this patient's exposure and mitigate transmission in our community.  Due to his co-morbid illnesses, this patient is at least at moderate risk for complications without adequate follow up.  This format is felt to be most appropriate for this patient at this time.  All issues noted in this document were discussed and addressed.  A limited physical exam was performed with this format.  Please refer to the patient's chart for his consent to telehealth for [facility].   Evaluation Performed:  Follow-up visit  Date:  11/07/2018   ID:  Billy Erickson, DOB 1945-12-05, MRN [id number]  Patient Location: Home Provider Location: Home  PCP:  ViaCaryn Bee, MD  Cardiologist:   Maximino Greenland  Electrophysiologist:  None   Chief Complaint: Hyperlipidemia and shortness of breath  History of Present Illness:    Billy Erickson is a 73 y.o. male with a history of hyperlipidemia.  He is a patient of Dr. York Spaniel.  We are seeing him today via virtual visit with for further evaluation of some worsening shortness of breath. Saw [facility] several years ago  eCG and echo were unremarkable   Was working in the garden 3 weeks ago , tilling  After several passes had to stop,   Had to lean down and had to stop.   Another 2 rows , had to stop Had to go lie down , couldn't catch his breath . Called pcp,  Was referred to [place] A week later, was mowing with self propelled mower, got very winded.  DOE with climbing stairs He is typically very busy,   Splits wood, chain saw work . Walks the dog several times a day  Activities have been somewhat limited by back surgery last year.   No real chest pain or heaviness. Feels like his breathing is shallow  No diaphoresis,   No presyncope , no syncope  Eating and drinking regularly     No cough, no fever or aches. Has sore throat - likely allergies Has some fatigue The patient does not have symptoms concerning for COVID-19 infection (fever, chills, cough, or new shortness of breath).    Past Medical History:  Diagnosis Date  . Aortic regurgitation    mild to moderate AR 05/29/16 echo [facility])  . Arthritis   . Dyspnea    with exercise only  . Enlarged prostate   . GERD (gastroesophageal reflux disease)   . History of kidney stones   . Hyperlipidemia   . Kidney stones   . Liver cyst   . Wrist pain, right    Past Surgical History:  Procedure Laterality Date  . ANTERIOR CRUCIATE LIGAMENT REPAIR    . ANTERIOR LAT LUMBAR FUSION Right 01/08/2018   Procedure: ANTERIOR LATERAL INTERBODY FUSION LUMBAR THREE- LUMBAR FOUR, LUMBAR FOUR- LUMBAR FIVE;  Surgeon: Lisbeth Renshaw, MD;  [facility];  Service: Neurosurgery;  Laterality: Right;  LUMBAR INTERBODY FUSION  . APPLICATION OF ROBOTIC ASSISTANCE FOR SPINAL PROCEDURE N/A 01/08/2018   Procedure: APPLICATION OF ROBOTIC ASSISTANCE FOR SPINAL PROCEDURE;  Surgeon: Lisbeth Renshaw, MD;  [facility];  Service: Neurosurgery;  Laterality: N/A;  APPLICATION OF ROBOTIC ASSISTANCE FOR SPINAL PROCEDURE  . CYSTOSCOPY/RETROGRADE/URETEROSCOPY  02/13/2008   basketing of fragments  . EXTRACORPOREAL SHOCK WAVE LITHOTRIPSY Left 03/19/2017   Procedure: LEFT EXTRACORPOREAL SHOCK WAVE LITHOTRIPSY (ESWL);  Surgeon: Crist FatHerrick, Benjamin W, MD;  [facility];  Service: Urology;  Laterality: Left;  . KNEE ARTHROSCOPY WITH MEDIAL MENISECTOMY Left 08/18/2016   Procedure: LEFT KNEE ARTHROSCOPY, CHONDROPLASTY AND PARTIAL MEDIAL AND LATERAL MENISCECTOMY;  Surgeon: Sheral Apleyimothy D Murphy, MD;  Location: [facility];  Service: Orthopedics;  Laterality: Left;  . KNEE SURGERY Right   . LUMBAR PERCUTANEOUS PEDICLE SCREW 3 LEVEL N/A 01/08/2018   Procedure: LUMBAR PERCUTANEOUS PEDICLE SCREW PLACEMENT LUMBAR THREE-FOUR, LUMBAR  FOUR-FIVE, LUMBAR FIVE-SACRAL ONE;  Surgeon: Lisbeth RenshawNundkumar, Neelesh, MD;  [facility];  Service: Neurosurgery;  Laterality: N/A;  . MRI    . nuclear stress test    . TONSILLECTOMY    . TRANSFORAMINAL LUMBAR INTERBODY FUSION (TLIF) WITH PEDICLE SCREW FIXATION 1 LEVEL N/A 01/08/2018   Procedure: TRANSFORAMINAL LUMBAR INTERBODY FUSION LUMBAR FIVE-SACRAL ONE;  Surgeon: Lisbeth RenshawNundkumar, Neelesh, MD;  [facility];  Service: Neurosurgery;  Laterality: N/A;  . WRIST ARTHROSCOPY WITH ULNA SHORTENING Right 07/06/2015   Procedure: RIGHT WRIST WITH  ARTHROSCOPIC DEBRIDEMENT AND ULNAR SHORTENING OSTEOTOMY;  Surgeon: Mack Hookavid Thompson, MD;  Location: [facility];  Service: Orthopedics;  Laterality: Right;     Current Meds  Medication Sig  . aspirin EC 81 MG tablet Take 1 tablet (81 mg total) by mouth daily.  Marland Kitchen. gabapentin (NEURONTIN) 300 MG capsule Take 300 mg by mouth at bedtime.   . Glucosamine-Chondroit-Vit C-Mn (GLUCOSAMINE 1500 COMPLEX PO) Take 1 tablet by mouth daily.  Marland Kitchen. ibuprofen (ADVIL,MOTRIN) 100 MG tablet Take 200 mg by mouth every 6 (six) hours as needed for pain or fever.  . methocarbamol (ROBAXIN) 500 MG tablet Take 1 tablet (500 mg total) by mouth every 6 (six) hours as needed for muscle spasms.  . Multiple Vitamin (MULTIVITAMIN WITH MINERALS) TABS tablet Take 1 tablet by mouth daily.  Marland Kitchen. omeprazole (PRILOSEC) 20 MG capsule Take 20 mg by mouth daily.  . simvastatin (ZOCOR) 40 MG tablet Take 40 mg by mouth every evening.   . tamsulosin (FLOMAX) 0.4 MG CAPS capsule Take 0.4 mg by mouth daily.  Marland Kitchen. tiZANidine (ZANAFLEX) 4 MG tablet Take 4 mg by mouth every 6 (six) hours as needed for muscle spasms.   Marland Kitchen. zolpidem (AMBIEN CR) 6.25 MG CR tablet Take 6.25 mg by mouth at bedtime as needed for sleep.      Allergies:   Sudafed [pseudoephedrine]   Social History   Tobacco Use  . Smoking status: Never Smoker  . Smokeless tobacco: Never Used  Substance Use Topics  . Alcohol use: Yes     Alcohol/week: 6.0 standard drinks    Types: 6 Glasses of wine per week    Comment: social  . Drug use: No     Family Hx: The patient's family history includes Alzheimer's disease in his mother.  ROS:   Please see the history of present illness.     All other systems reviewed and are negative.   Prior CV studies:   The following studies were reviewed today:    Labs/Other Tests and Data Reviewed:    EKG:  An ECG dated 6/19/  2019  was personally reviewed today and demonstrated:   NSR ,  no St or T wave changes  Recent Labs: 04/18/2018: BUN 24; Creatinine, Ser 1.23; Hemoglobin 14.9; Platelets 182; Potassium 4.2; Sodium 142   Recent Lipid Panel No results found for: CHOL, TRIG, HDL, CHOLHDL, LDLCALC, LDLDIRECT  Wt Readings from Last 3 Encounters:  11/07/18 199 lb (90.3 kg)  04/18/18 197  lb 8 oz (89.6 kg)  01/08/18 202 lb (91.6 kg)     Objective:    Vital Signs:  BP 128/72 (BP Location: Left Arm, Patient Position: Sitting, Cuff Size: Normal)   Pulse 64   Ht 5\' 10"  (1.778 m)   Wt 199 lb (90.3 kg)   BMI 28.55 kg/m    General:   Appears healthy,   NAD HEENT:   No obvious JVD or lymphadenopathy Resp:   Normal work of breathing,   resp rate is normal  CV :   BP and HR are normal ,  No edema Abd:   No abdomina swelling , Ext:   No clubbing, cyanosis, or edema  Neuro:   Alert and oriented x 3.   No obvious motor deficits Skin : no obvious rashes    ASSESSMENT & PLAN:    1. Chest pain/shortness of breath.  Patient presents with episodes of chest tightness with exertion.  He is never had any episodes of angina.  He does have severe shortness of breath and fatigue with exertion and has had to stop when he is out working in his garden.  Some of the symptoms may be related to generalized deconditioning.  He had neck surgery last June and has not really been exercising since that time.  These also might be seasonal allergies.  We will send in a prescription for albuterol  inhaler.  I would like to do an echocardiogram and stress test/or coronary angiogram.  We will need to do these in several months.  We will go ahead and schedule these tests and I will plan on seeing him in the office in 2 to 3 months for a follow-up visit.  He has agreed to try to start a walking program - start slowly and gradually build up   He has access to my chart and will send [place] a message in several weeks to see if he is maybe progress.  COVID-19 Education: The signs and symptoms of COVID-19 were discussed with the patient and how to seek care for testing (follow up with PCP or arrange E-visit).  The importance of social distancing was discussed today.  Time:   Today, I have spent  30  minutes with the patient with telehealth technology discussing the above problems + 13 additional  minutes charting / chart prep    Medication Adjustments/Labs and Tests Ordered: Current medicines are reviewed at length with the patient today.  Concerns regarding medicines are outlined above.   Tests Ordered: No orders of the defined types were placed in this encounter.   Medication Changes: No orders of the defined types were placed in this encounter.   Disposition:  Follow up in 3 month(s)  Signed, Kristeen Miss, MD  11/07/2018 10:23 AM    [facility]

## 2018-11-07 NOTE — Telephone Encounter (Signed)
[facility] pharmacy stating that pt's insurance will only pay for Proair or Gen inhaler. Please address

## 2018-11-07 NOTE — Patient Instructions (Addendum)
Medication Instructions:  Your physician has recommended you make the following change in your medication:  START Albuterol inhaler 2 puffs every 6 hours as needed for wheezing or shortness of breath   Lab work: None Ordered     Testing/Procedures: **We will call you to schedule. Your physician has requested that you have an echocardiogram. Echocardiography is a painless test that uses sound waves to create images of your heart. It provides your doctor with information about the size and shape of your heart and how well your heart's chambers and valves are working. This procedure takes approximately one hour. There are no restrictions for this procedure.   Coronary CT angiogram - We will call you to schedule and to go through these instructions:  Please arrive at the [facility] main entrance of [facility] at xx:xx AM (30-45 minutes prior to test start time)  [facility] [address] [place], [place] [address] [phone]  Proceed to the [facility] Radiology Department (First Floor).  Please follow these instructions carefully (unless otherwise directed):  Hold all erectile dysfunction medications at least 48 hours prior to test.  On the Night Before the Test: . Be sure to Drink plenty of water. . Do not consume any caffeinated/decaffeinated beverages or chocolate 12 hours prior to your test. . Do not take any antihistamines 12 hours prior to your test. . If you take Metformin do not take 24 hours prior to test. . If the patient has contrast allergy: ? Patient will need a prescription for Prednisone and very clear instructions (as follows): 1. Prednisone 50 mg - take 13 hours prior to test 2. Take another Prednisone 50 mg 7 hours prior to test 3. Take another Prednisone 50 mg 1 hour prior to test 4. Take Benadryl 50 mg 1 hour prior to test . Patient must complete all four doses of above prophylactic medications. . Patient will need a ride after test  due to Benadryl.  On the Day of the Test: . Drink plenty of water. Do not drink any water within one hour of the test. . Do not eat any food 4 hours prior to the test. . You may take your regular medications prior to the test.  . We will send one dose of metoprolol (Lopressor) to your pharmacy for you to take two hours prior to test.      After the Test: . Drink plenty of water. . After receiving IV contrast, you may experience a mild flushed feeling. This is normal. . On occasion, you may experience a mild rash up to 24 hours after the test. This is not dangerous. If this occurs, you can take Benadryl 25 mg and increase your fluid intake. . If you experience trouble breathing, this can be serious. If it is severe call 911 IMMEDIATELY. If it is mild, please call our office. . If you take any of these medications: Glipizide/Metformin, Avandament, Glucavance, please do not take 48 hours after completing test.   Follow-Up: At [facility], you and your health needs are our priority.  As part of our continuing mission to provide you with exceptional heart care, we have created designated Provider Care Teams.  These Care Teams include your primary Cardiologist (physician) and Advanced Practice Providers (APPs -  Physician Assistants and Nurse Practitioners) who all work together to provide you with the care you need, when you need it. You will need a follow up appointment in:  2- 3 months.   You  may see Dr. Elease HashimotoNahser or one of the following Advanced Practice Providers on your designated Care Team: Tereso NewcomerScott Weaver, PA-C Vin WaukeenahBhagat, [place] JerseyPA-C . Berton BonJanine Hammond, NP

## 2018-11-07 NOTE — Telephone Encounter (Signed)
New Rx for ProAir [facility]lick sent to [facility]

## 2018-12-03 ENCOUNTER — Other Ambulatory Visit: Payer: Self-pay | Admitting: Nurse Practitioner

## 2018-12-03 DIAGNOSIS — R0609 Other forms of dyspnea: Secondary | ICD-10-CM

## 2018-12-03 DIAGNOSIS — R0789 Other chest pain: Secondary | ICD-10-CM

## 2018-12-03 MED ORDER — METOPROLOL TARTRATE 100 MG PO TABS: ORAL_TABLET | ORAL | 0 refills | Status: DC

## 2018-12-05 ENCOUNTER — Other Ambulatory Visit: Payer: Medicare Other | Admitting: *Deleted

## 2018-12-05 ENCOUNTER — Other Ambulatory Visit: Payer: Self-pay

## 2018-12-05 DIAGNOSIS — R0609 Other forms of dyspnea: Secondary | ICD-10-CM

## 2018-12-05 DIAGNOSIS — R0789 Other chest pain: Secondary | ICD-10-CM

## 2018-12-05 LAB — BASIC METABOLIC PANEL
BUN/Creatinine Ratio: 17 (ref 10–24)
BUN: 19 mg/dL (ref 8–27)
CO2: 22 mmol/L (ref 20–29)
Calcium: 10 mg/dL (ref 8.6–10.2)
Chloride: 104 mmol/L (ref 96–106)
Creatinine, Ser: 1.11 mg/dL (ref 0.76–1.27)
GFR calc Af Amer: 77 mL/min/{1.73_m2} (ref >59)
GFR calc non Af Amer: 66 mL/min/{1.73_m2} (ref >59)
Glucose: 77 mg/dL (ref 65–99)
Potassium: 4.6 mmol/L (ref 3.5–5.2)
Sodium: 140 mmol/L (ref 134–144)

## 2018-12-11 ENCOUNTER — Ambulatory Visit (HOSPITAL_COMMUNITY): Payer: Medicare Other

## 2018-12-12 ENCOUNTER — Telehealth (HOSPITAL_COMMUNITY): Payer: Self-pay | Admitting: Emergency Medicine

## 2018-12-12 NOTE — Telephone Encounter (Signed)
Left message on voicemail with name and callback number Chirsty Armistead RN Navigator Cardiac Imaging [facility] [phone] Office [phone] Cell  

## 2018-12-13 ENCOUNTER — Ambulatory Visit (HOSPITAL_COMMUNITY)
Admission: RE | Admit: 2018-12-13 | Discharge: 2018-12-13 | Disposition: A | Discharge status: Home / Self Care | Payer: Medicare Other | Source: Ambulatory Visit | Attending: Cardiovascular Disease | Admitting: Cardiovascular Disease

## 2018-12-13 ENCOUNTER — Other Ambulatory Visit: Payer: Self-pay

## 2018-12-13 ENCOUNTER — Encounter (HOSPITAL_COMMUNITY): Payer: Self-pay

## 2018-12-13 DIAGNOSIS — Z006 Encounter for examination for normal comparison and control in clinical research program: Secondary | ICD-10-CM

## 2018-12-13 DIAGNOSIS — R0789 Other chest pain: Secondary | ICD-10-CM | POA: Diagnosis present

## 2018-12-13 DIAGNOSIS — I2510 Atherosclerotic heart disease of native coronary artery without angina pectoris: Secondary | ICD-10-CM | POA: Diagnosis not present

## 2018-12-13 DIAGNOSIS — R0609 Other forms of dyspnea: Secondary | ICD-10-CM | POA: Diagnosis not present

## 2018-12-13 DIAGNOSIS — R079 Chest pain, unspecified: Secondary | ICD-10-CM

## 2018-12-13 MED ORDER — NITROGLYCERIN 0.4 MG SL SUBL
0.8000 mg | SUBLINGUAL_TABLET | SUBLINGUAL | Status: DC | PRN
  Administered 2018-12-13: 15:00:00 0.8 mg via SUBLINGUAL

## 2018-12-13 MED ORDER — NITROGLYCERIN 0.4 MG SL SUBL
SUBLINGUAL_TABLET | SUBLINGUAL | Status: AC
  Filled 2018-12-13: qty 2

## 2018-12-13 MED ORDER — IOHEXOL 350 MG/ML SOLN
100.0000 mL | Freq: Once | INTRAVENOUS | Status: AC | PRN
  Administered 2018-12-13: 100 mL via INTRAVENOUS

## 2018-12-13 NOTE — Research (Addendum)
Cadfem Informed Consent    Patient Name: Billy Erickson    Subject met inclusion and exclusion criteria.  The informed consent form, study requirements and expectations were reviewed with the subject and questions and concerns were addressed prior to the signing of the consent form.  The subject verbalized understanding of the trail requirements.  The subject agreed to participate in the CADFEM trial and signed the informed consent.  The informed consent was obtained prior to performance of any protocol-specific procedures for the subject.  A copy of the signed informed consent was given to the subject and a copy was placed in the subject's medical record.   [name]

## 2018-12-17 ENCOUNTER — Telehealth: Payer: Self-pay | Admitting: Nurse Practitioner

## 2018-12-17 NOTE — Telephone Encounter (Signed)
Reviewed coronary CT results with patient who verbalized understanding. Patient asks about lung scarring that is noted in the CT report. I advised him to follow-up with his PCP regarding potential causes of the scarring. We discussed the relationship between CT findings and cholesterol results and patient states he does not have a copy of recent lipid panel from PCP. He states he will request that Dr. Susa Simmonds get an updated lipid panel and would like to discuss results with Dr. Elease Hashimoto. He is awaiting an echo at our office for further evaluation of his DOE. He denies improvement in DOE symptoms with use of albuterol inhaler. Patient verbalized agreement with plan to follow-up after lab and echo results are available. He thanked me for the call.

## 2018-12-17 NOTE — Telephone Encounter (Signed)
-----   Message from Vesta Mixer, MD sent at 12/15/2018  3:55 PM EDT ----- Mild - mod CAD  His chest pain and DOE appear to be from a noncardiac source.

## 2018-12-20 ENCOUNTER — Other Ambulatory Visit: Payer: Self-pay | Admitting: Nurse Practitioner

## 2018-12-20 DIAGNOSIS — E782 Mixed hyperlipidemia: Secondary | ICD-10-CM

## 2018-12-20 MED ORDER — ROSUVASTATIN CALCIUM 10 MG PO TABS: 10.0000 mg | ORAL_TABLET | Freq: Every day | ORAL | 3 refills | Status: DC

## 2018-12-26 ENCOUNTER — Telehealth (HOSPITAL_COMMUNITY): Payer: Self-pay | Admitting: Cardiology

## 2018-12-26 ENCOUNTER — Other Ambulatory Visit: Payer: Self-pay

## 2018-12-26 ENCOUNTER — Ambulatory Visit (HOSPITAL_COMMUNITY): Payer: Medicare Other | Attending: Cardiology

## 2018-12-26 DIAGNOSIS — R0789 Other chest pain: Secondary | ICD-10-CM | POA: Diagnosis not present

## 2018-12-26 DIAGNOSIS — R0609 Other forms of dyspnea: Secondary | ICD-10-CM | POA: Insufficient documentation

## 2018-12-26 NOTE — Telephone Encounter (Signed)

## 2019-01-07 ENCOUNTER — Other Ambulatory Visit: Payer: Self-pay | Admitting: Physician Assistant

## 2019-01-07 DIAGNOSIS — M4326 Fusion of spine, lumbar region: Secondary | ICD-10-CM

## 2019-01-08 ENCOUNTER — Telehealth: Payer: Self-pay | Admitting: Nurse Practitioner

## 2019-01-08 NOTE — Telephone Encounter (Signed)
Phone call to patient to verify medication list and allergies for myelogram procedure. Pt aware he will not need to hold any medications for this procedure. Pre and post procedure instructions reviewed with pt. Pt verbalized understanding.  

## 2019-01-10 NOTE — Progress Notes (Signed)
Greater than 5 minutes, yet less than 10 minutes of time have been spent researching, coordinating, and implementing care for this patient today.  Thank you for the details you included in the comment boxes. Those details are very helpful in determining the best course of treatment for you and help us to provide the best care.  

## 2019-02-03 ENCOUNTER — Other Ambulatory Visit: Payer: Self-pay | Admitting: Physician Assistant

## 2019-02-03 DIAGNOSIS — M4326 Fusion of spine, lumbar region: Secondary | ICD-10-CM

## 2019-02-04 ENCOUNTER — Ambulatory Visit
Admission: RE | Admit: 2019-02-04 | Discharge: 2019-02-04 | Disposition: A | Discharge status: Home / Self Care | Payer: Medicare Other | Source: Ambulatory Visit | Attending: Physician Assistant | Admitting: Physician Assistant

## 2019-02-04 ENCOUNTER — Other Ambulatory Visit: Payer: Self-pay

## 2019-02-04 DIAGNOSIS — M4326 Fusion of spine, lumbar region: Secondary | ICD-10-CM

## 2019-02-04 MED ORDER — HYDROCODONE-ACETAMINOPHEN 5-325 MG PO TABS
1.0000 | ORAL_TABLET | Freq: Once | ORAL | Status: AC
  Administered 2019-02-04: 10:00:00 1 via ORAL

## 2019-02-04 MED ORDER — ONDANSETRON HCL 4 MG/2ML IJ SOLN: 4.0000 mg | Freq: Four times a day (QID) | INTRAMUSCULAR | Status: DC | PRN

## 2019-02-04 MED ORDER — IOPAMIDOL (ISOVUE-M 300) INJECTION 61%
10.0000 mL | Freq: Once | INTRAMUSCULAR | Status: AC
  Administered 2019-02-04: 10:00:00 10 mL via INTRATHECAL

## 2019-02-04 MED ORDER — DIAZEPAM 5 MG PO TABS
5.0000 mg | ORAL_TABLET | Freq: Once | ORAL | Status: AC
  Administered 2019-02-04: 5 mg via ORAL

## 2019-02-04 NOTE — Discharge Instructions (Signed)

## 2019-08-11 IMAGING — CT CT L SPINE W/O CM
3 of 5 series · 13 of 33 positions shown, 15 images · non-contrast
Comparison: MRI 11/29/2017

CLINICAL DATA: Degenerative disc disease with pain radiating down
the left leg and weakness of the right leg.

EXAM:
CT LUMBAR SPINE WITHOUT CONTRAST
TECHNIQUE: Multidetector CT imaging of the lumbar spine was performed without
intravenous contrast administration. Multiplanar CT image
reconstructions were also generated.

[Series 5: l spine soft · axial · 0.33mm/px · z∈[+794,+1020]mm · 7 of 135 slices shown, 9 images]
[im 11/135  soft-tissue]
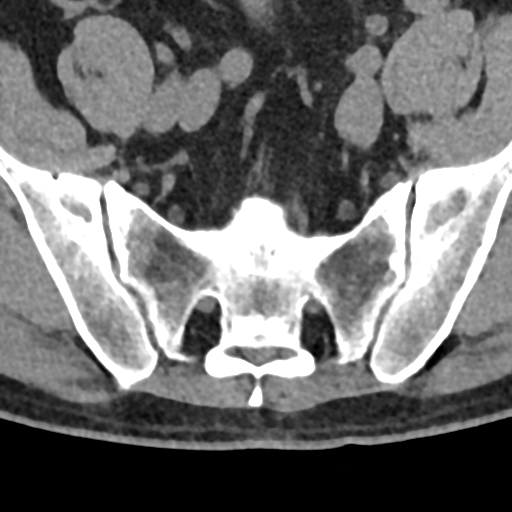
[im 11/135  bone]
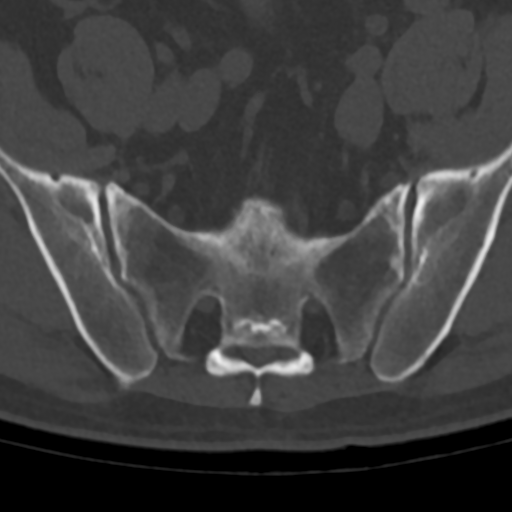
[im 31/135  bone]
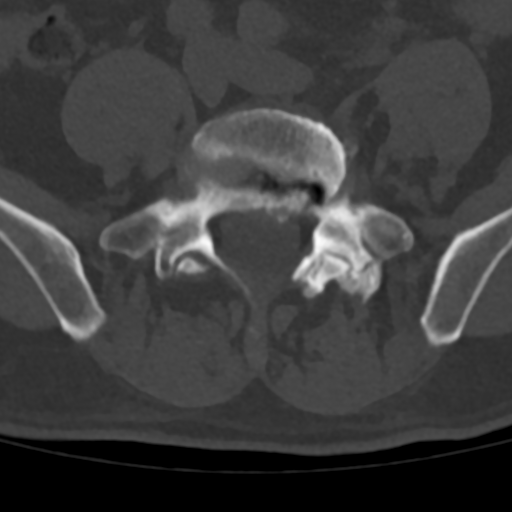
[im 52/135  bone]
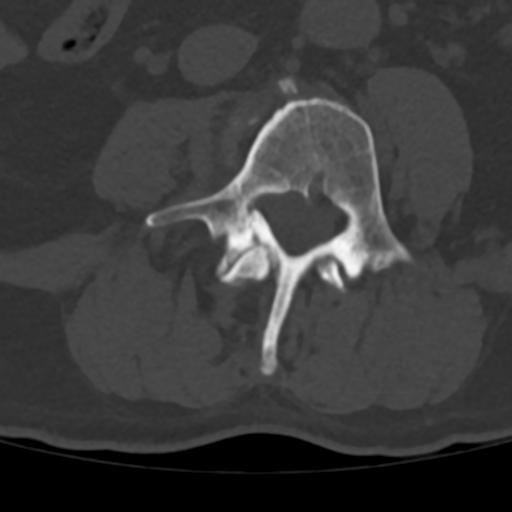
[im 73/135  bone]
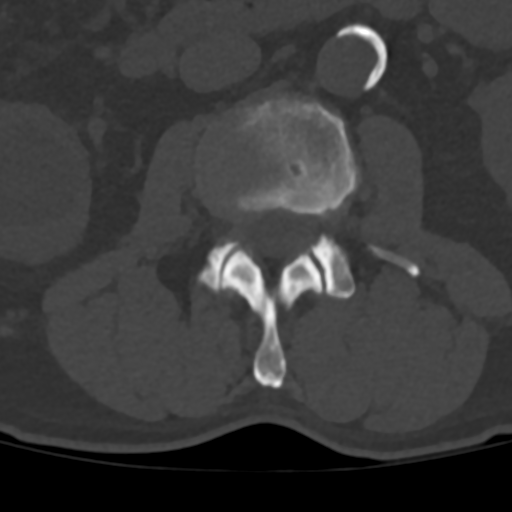
[im 83/135  soft-tissue]
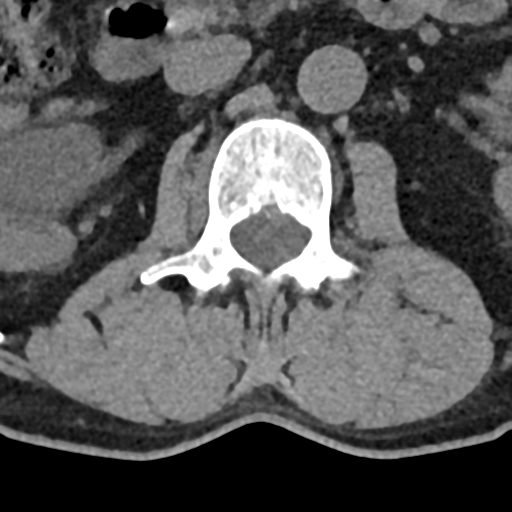
[im 83/135  bone]
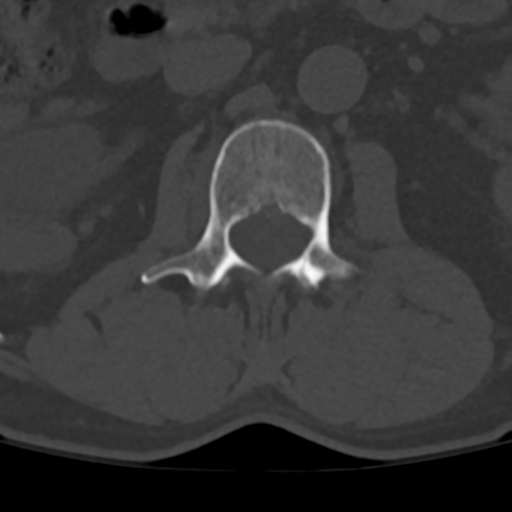
[im 104/135  bone]
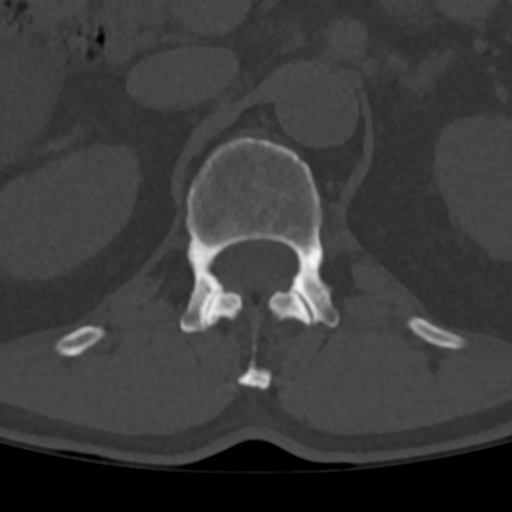
[im 124/135  bone]
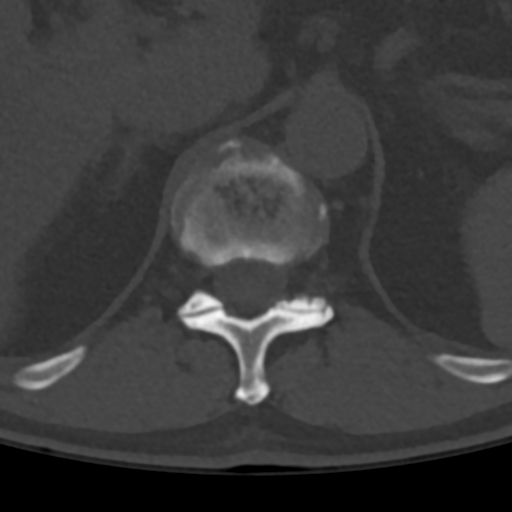

[Series 7: cor bone · coronal · 0.39mm/px · 1 of 61 slices shown]
[im 31/61  bone]
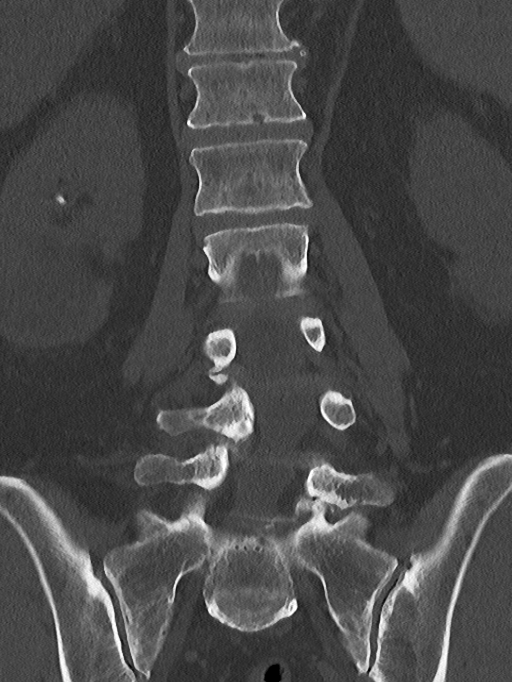

[Series 9: sag st · sagittal · 0.37mm/px · 5 of 114 slices shown]
[im 17/114  bone]
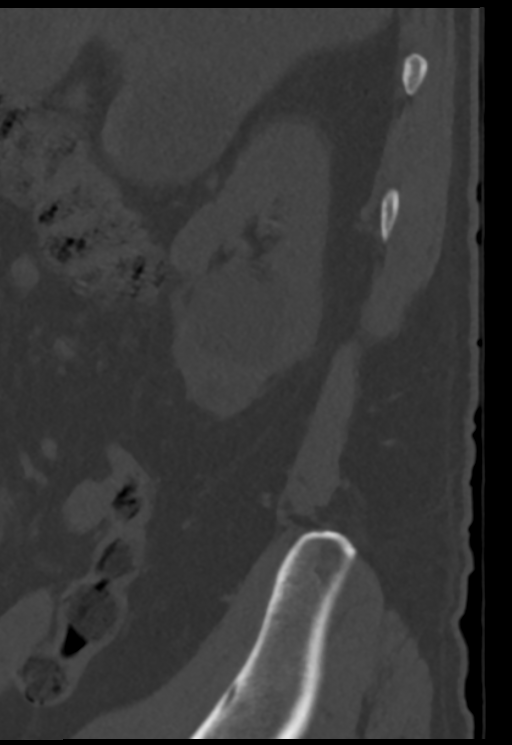
[im 33/114  bone]
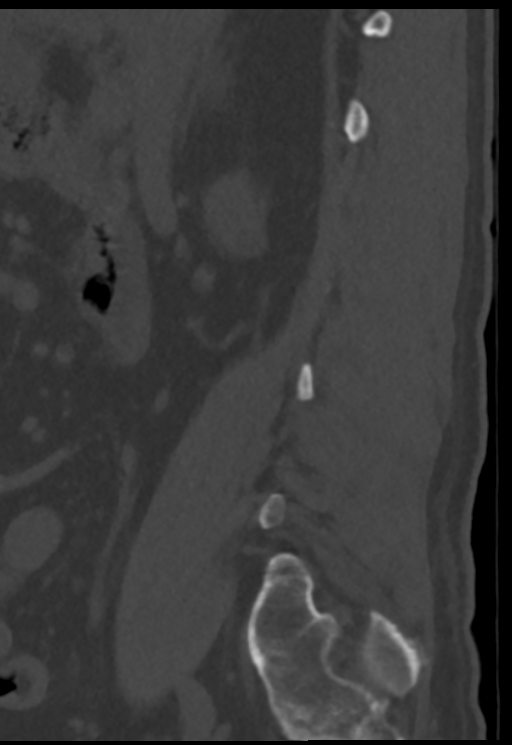
[im 49/114  bone]
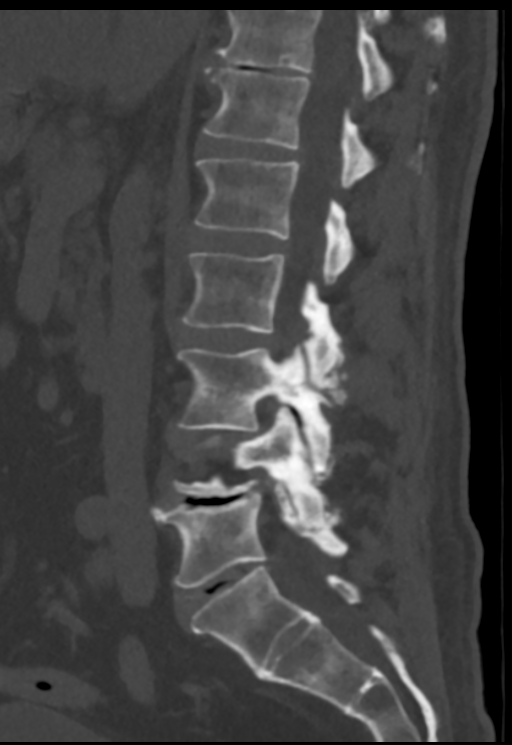
[im 65/114  bone]
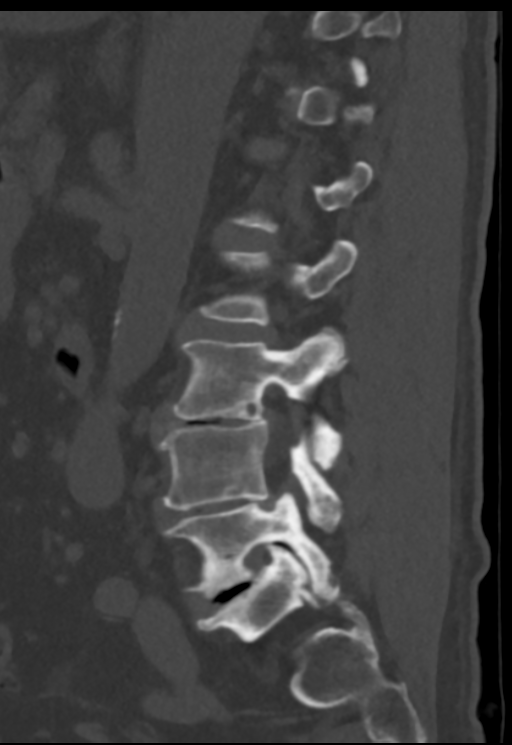
[im 81/114  bone]
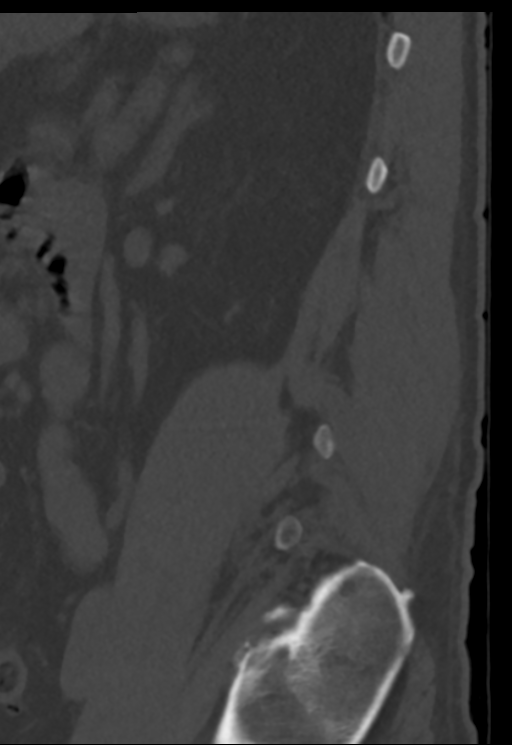

[13 of 33 positions shown; findings below may reference images not displayed]

FINDINGS: Segmentation: 5 non ribbed lumbar vertebrae.

Alignment: Approximately 34 degrees of levoconvex curvature of the
lumbar spine with maintained lumbar lordosis.

Vertebrae: No fracture or suspicious osseous lesions. Degenerative
endplate changes noted of L2 through S1.

Paraspinal and other soft tissues: Moderate atherosclerosis of the
visualized abdominal aorta without aneurysm. Mild ectasia of the
common iliac arteries bilaterally. 4 mm calculus in the upper pole
the right kidney with large parapelvic partially included cyst of
the right kidney. Additional lower pole calculus is noted on the
right measuring up to 5 mm. Smaller cortical cysts are noted in the
interpolar and lower pole of the right kidney. The adrenal glands
are normal. There is no hydroureteronephrosis. No adenopathy.

Disc levels:

T11-12, moderate-to-marked disc flattening with vacuum disc
phenomenon and mild disc bulge. No significant foraminal or canal
stenosis.

T12-L1, slight disc space narrowing with mild disc bulge. No
significant central or foraminal stenosis. Mild right-sided facet
joint space narrowing and sclerosis.

L1-2: Bilateral facet arthropathy without focal disc herniation or
stenosis.

L2-3: Mild disc bulge eccentric to the left with facet arthropathy.
No significant foraminal or central canal stenosis.

L3-4: Marked disc flattening with vacuum disc phenomena, concentric
disc bulge and severe right and mild left facet arthropathy.
Resultant moderate right lateral recess stenosis and
mild-to-moderate foraminal stenosis. No significant central canal
stenosis. Potential for right L3 nerve root impingement.

L4-5: Marked disc flattening with disc-osteophyte complex, facet
arthropathy right greater than left resulting and right
moderate-to-marked foraminal stenosis and mild bilateral lateral
recess stenosis. Potential for right L4 nerve root impingement.

L5-S1: Disc-osteophyte complex asymmetric to the left with mild
right and marked left facet arthropathy resulting in
moderate-to-marked left foraminal stenosis. No central canal
stenosis. Potential for left L5 nerve root impingement.
IMPRESSION: 1. Lumbar spondylosis with levoscoliosis as above described.
2. Marked foraminal stenosis on the right at L4-5 and on the left at
L5-S1.
3. No acute lumbar spine fracture or suspicious osseous lesions.
4. Right-sided parapelvic and cortical cysts with nonobstructing
renal calculi.

## 2019-08-20 ENCOUNTER — Ambulatory Visit: Payer: Medicare Other

## 2019-08-28 ENCOUNTER — Ambulatory Visit: Payer: Medicare PPO | Attending: Internal Medicine

## 2019-08-28 DIAGNOSIS — Z23 Encounter for immunization: Secondary | ICD-10-CM | POA: Insufficient documentation

## 2019-08-28 NOTE — Progress Notes (Signed)
   Covid-19 Vaccination Clinic  Name:  Billy Erickson    MRN: [id number] DOB: 26-Mar-1946  08/28/2019  [name] was observed post Covid-19 immunization for 15 minutes without incidence. He was provided with Vaccine Information Sheet and instruction to access the V-Safe system.   [name] was instructed to call 911 with any severe reactions post vaccine: Marland Kitchen Difficulty breathing  . Swelling of your face and throat  . A fast heartbeat  . A bad rash all over your body  . Dizziness and weakness    Immunizations Administered    Name Date Dose VIS Date Route   Pfizer COVID-19 Vaccine 08/28/2019  4:52 PM 0.3 mL 07/04/2019 Intramuscular   Manufacturer: [employer], [employer]   Lot: [id number]   NDC: [address]-1

## 2019-09-10 ENCOUNTER — Ambulatory Visit: Payer: Medicare Other

## 2019-09-22 ENCOUNTER — Ambulatory Visit: Payer: Medicare PPO | Attending: Internal Medicine

## 2019-09-22 DIAGNOSIS — Z23 Encounter for immunization: Secondary | ICD-10-CM | POA: Insufficient documentation

## 2019-09-22 NOTE — Progress Notes (Signed)
   Covid-19 Vaccination Clinic  Name:  Billy Erickson    MRN: [id number] DOB: 19-Dec-1945  09/22/2019  [name] was observed post Covid-19 immunization for 15 minutes without incidence. He was provided with Vaccine Information Sheet and instruction to access the V-Safe system.   [name] was instructed to call 911 with any severe reactions post vaccine: Marland Kitchen Difficulty breathing  . Swelling of your face and throat  . A fast heartbeat  . A bad rash all over your body  . Dizziness and weakness    Immunizations Administered    Name Date Dose VIS Date Route   Pfizer COVID-19 Vaccine 09/22/2019  5:41 PM 0.3 mL 07/04/2019 Intramuscular   Manufacturer: [employer], [employer]   Lot: [id number]   NDC: [address]-3

## 2019-10-21 DIAGNOSIS — M5124 Other intervertebral disc displacement, thoracic region: Secondary | ICD-10-CM | POA: Diagnosis not present

## 2019-10-21 DIAGNOSIS — M47814 Spondylosis without myelopathy or radiculopathy, thoracic region: Secondary | ICD-10-CM | POA: Diagnosis not present

## 2019-10-21 DIAGNOSIS — M5134 Other intervertebral disc degeneration, thoracic region: Secondary | ICD-10-CM | POA: Diagnosis not present

## 2019-10-21 DIAGNOSIS — R29898 Other symptoms and signs involving the musculoskeletal system: Secondary | ICD-10-CM | POA: Diagnosis not present

## 2019-10-28 DIAGNOSIS — M7918 Myalgia, other site: Secondary | ICD-10-CM | POA: Diagnosis not present

## 2019-10-28 DIAGNOSIS — Z6829 Body mass index (BMI) 29.0-29.9, adult: Secondary | ICD-10-CM | POA: Diagnosis not present

## 2019-10-28 DIAGNOSIS — R030 Elevated blood-pressure reading, without diagnosis of hypertension: Secondary | ICD-10-CM | POA: Diagnosis not present

## 2019-10-30 ENCOUNTER — Ambulatory Visit: Payer: Self-pay | Admitting: Podiatry

## 2019-11-07 ENCOUNTER — Ambulatory Visit: Payer: Medicare PPO | Admitting: Podiatry

## 2019-11-07 ENCOUNTER — Ambulatory Visit (INDEPENDENT_AMBULATORY_CARE_PROVIDER_SITE_OTHER): Payer: Medicare PPO

## 2019-11-07 ENCOUNTER — Other Ambulatory Visit: Payer: Self-pay

## 2019-11-07 DIAGNOSIS — M722 Plantar fascial fibromatosis: Secondary | ICD-10-CM | POA: Diagnosis not present

## 2019-11-07 DIAGNOSIS — B078 Other viral warts: Secondary | ICD-10-CM | POA: Diagnosis not present

## 2019-11-07 DIAGNOSIS — B079 Viral wart, unspecified: Secondary | ICD-10-CM

## 2019-11-07 DIAGNOSIS — M25572 Pain in left ankle and joints of left foot: Secondary | ICD-10-CM | POA: Diagnosis not present

## 2019-11-28 ENCOUNTER — Ambulatory Visit: Payer: Medicare PPO | Admitting: Podiatry

## 2019-12-18 ENCOUNTER — Other Ambulatory Visit: Payer: Self-pay

## 2019-12-18 ENCOUNTER — Ambulatory Visit: Payer: Medicare PPO | Admitting: Podiatry

## 2019-12-18 DIAGNOSIS — M25372 Other instability, left ankle: Secondary | ICD-10-CM

## 2019-12-18 DIAGNOSIS — B079 Viral wart, unspecified: Secondary | ICD-10-CM

## 2019-12-18 DIAGNOSIS — M25572 Pain in left ankle and joints of left foot: Secondary | ICD-10-CM | POA: Diagnosis not present

## 2019-12-18 DIAGNOSIS — B078 Other viral warts: Secondary | ICD-10-CM

## 2019-12-18 NOTE — Progress Notes (Signed)
  Subjective:  Patient ID: Billy Erickson, male    DOB: 12-20-1945,  MRN: [id number]  Chief Complaint  Patient presents with  . Foot Problem    Left ankle weakness 1 year duration  . Foot Pain    Left lateral aspect improved following injection  . Foot Pain    Left 2-3 interdigital metatarsal pain. Pt states 6 month duration, sharp shooting pains. No known injuries.    74 y.o. male presents with the above complaint. History confirmed with patient.  Objective:  Physical Exam: warm, good capillary refill, no trophic changes or ulcerative lesions, normal DP and PT pulses and normal sensory exam. Left Foot: POP left ATFL, lateral ankle gutter. Verrucous lesion plantar foot left  Assessment:   1. Verruca   2. Ankle instability, left   3. Sinus tarsitis of left foot      Plan:  Patient was evaluated and treated and all questions answered.  Sinus Tarsitis -Improved  Ankle Instability Left -Order MRI to evaluate ankle ligaments given recurrent sprains, falls, feeling of weakness. Also r/o OCD.  Verruca -Debrided and chemical destruction  Procedure: Destruction of Lesion Location: left submet 2 Anesthesia: none Instrumentation: 15 blade. Technique: Debridement of lesion to petechial bleeding. Small amount of salinocaine applied to the base of the lesion. Dressing: Dry, sterile, compression dressing. Disposition: Patient tolerated procedure well. Advised to leave dressing on for 6-8 hours. Thereafter patient to wash the area with soap and water and applied band-aid. Off-loading pads dispensed. Patient to return in 2 weeks for follow-up.   No follow-ups on file.

## 2019-12-19 ENCOUNTER — Telehealth: Payer: Self-pay | Admitting: *Deleted

## 2019-12-19 DIAGNOSIS — M25572 Pain in left ankle and joints of left foot: Secondary | ICD-10-CM

## 2019-12-19 DIAGNOSIS — M25372 Other instability, left ankle: Secondary | ICD-10-CM

## 2019-12-19 NOTE — Telephone Encounter (Signed)
Orders to Dr. Kandice Hams assistant Everlena Cooper, CMA for pre-cert and faxed to [facility].

## 2019-12-19 NOTE — Telephone Encounter (Signed)
-----   Message from Park Liter, DPM sent at 12/18/2019  3:14 PM EDT ----- Can we order MRI left ankle

## 2019-12-23 DIAGNOSIS — M7918 Myalgia, other site: Secondary | ICD-10-CM | POA: Diagnosis not present

## 2019-12-23 DIAGNOSIS — M5134 Other intervertebral disc degeneration, thoracic region: Secondary | ICD-10-CM | POA: Diagnosis not present

## 2019-12-23 DIAGNOSIS — M47814 Spondylosis without myelopathy or radiculopathy, thoracic region: Secondary | ICD-10-CM | POA: Diagnosis not present

## 2019-12-23 DIAGNOSIS — Z6828 Body mass index (BMI) 28.0-28.9, adult: Secondary | ICD-10-CM | POA: Diagnosis not present

## 2020-01-04 ENCOUNTER — Other Ambulatory Visit: Payer: Self-pay | Admitting: Cardiovascular Disease

## 2020-01-05 NOTE — Progress Notes (Signed)
  Subjective:  Patient ID: Billy Erickson, male    DOB: February 24, 1946,  MRN: [id number]  Chief Complaint  Patient presents with  . Foot Pain    left foot , lateral side . ongoing for 3 months. has tried numerous treatments and all seemed to not work. has burning and sharp sensation and hurts when walking   . Foot Orthotics    wants a consult in regards to orthotics     74 y.o. male presents with the above complaint. History confirmed with patient.   Objective:  Physical Exam: warm, good capillary refill, no trophic changes or ulcerative lesions, normal DP and PT pulses and normal sensory exam. Left Foot: POP sinus tarsi left, pes planus, punctate verrucous lesion submet 2    No images are attached to the encounter.  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 Verruca    Plan:  Patient was evaluated and treated and all questions answered.  Sinus Tarsitis -X-rays reviewed as above -Injection delivered to the sinus tarsi  Procedure: Injection Intermediate Joint Consent: Verbal consent obtained. Location: Left sinus tarsi. Skin Prep: alcohol. Injectate: 1 cc 0.5% marcaine plain, 1 cc dexamethasone phosphate, 0.5 cc kenalog 10. Disposition: Patient tolerated procedure well. Injection site dressed with a band-aid.  Verruca  -Educated on the etiology. -Lesion destroyed as below. -Educated on post-op care.  Procedure: Destruction of Lesion Location: left submet 2 Anesthesia: none Instrumentation: 15 blade. Technique: Debridement of lesion to petechial bleeding. Aperture pad applied around lesion. Small amount of canthrone applied to the base of the lesion. Dressing: Dry, sterile, compression dressing. Disposition: Patient tolerated procedure well. Advised to leave dressing on for 6-8 hours. Thereafter patient to wash the area with soap and water and applied  band-aid. Off-loading pads dispensed. Patient to return in 2 weeks for follow-up.   Return in about 3 weeks (around 11/28/2019) for Sinus tarsitis, wart left foot.

## 2020-01-29 ENCOUNTER — Other Ambulatory Visit: Payer: Self-pay | Admitting: Cardiovascular Disease

## 2020-02-05 ENCOUNTER — Ambulatory Visit: Payer: Medicare PPO | Admitting: Podiatry

## 2020-02-07 ENCOUNTER — Other Ambulatory Visit: Payer: Self-pay | Admitting: Cardiovascular Disease

## 2020-02-26 ENCOUNTER — Other Ambulatory Visit: Payer: Self-pay | Admitting: Cardiovascular Disease

## 2020-03-07 ENCOUNTER — Other Ambulatory Visit: Payer: Self-pay | Admitting: Cardiovascular Disease

## 2020-03-30 ENCOUNTER — Telehealth: Payer: Self-pay | Admitting: Cardiovascular Disease

## 2020-03-30 MED ORDER — ROSUVASTATIN CALCIUM 10 MG PO TABS: 10.0000 mg | ORAL_TABLET | Freq: Every day | ORAL | 0 refills | Status: DC

## 2020-03-30 NOTE — Telephone Encounter (Signed)
*  STAT* If patient is at the pharmacy, call can be transferred to refill team.   1. Which medications need to be refilled? (please list name of each medication and dose if known) rosuvastatin (CRESTOR) 10 MG tablet  2. Which pharmacy/location (including street and city if local pharmacy) is medication to be sent to? [id number] [place], [place] - [address]   3. Do they need a 30 day or 90 day supply? 90 day supply   Pt has an appt scheduled for tomorrow, 03/31/20.

## 2020-03-30 NOTE — Telephone Encounter (Signed)
Pt's medication was sent to pt's pharmacy as requested. Confirmation received.  °

## 2020-03-31 ENCOUNTER — Other Ambulatory Visit: Payer: Self-pay

## 2020-03-31 ENCOUNTER — Ambulatory Visit: Payer: Medicare PPO | Admitting: Cardiovascular Disease

## 2020-03-31 ENCOUNTER — Encounter: Payer: Self-pay | Admitting: Cardiovascular Disease

## 2020-03-31 VITALS — BP 100/72 | HR 60 | Ht 70.0 in | Wt 199.0 lb

## 2020-03-31 DIAGNOSIS — I2510 Atherosclerotic heart disease of native coronary artery without angina pectoris: Secondary | ICD-10-CM

## 2020-03-31 DIAGNOSIS — I2584 Coronary atherosclerosis due to calcified coronary lesion: Secondary | ICD-10-CM | POA: Diagnosis not present

## 2020-03-31 DIAGNOSIS — E785 Hyperlipidemia, unspecified: Secondary | ICD-10-CM | POA: Diagnosis not present

## 2020-03-31 DIAGNOSIS — E782 Mixed hyperlipidemia: Secondary | ICD-10-CM | POA: Diagnosis not present

## 2020-03-31 DIAGNOSIS — R0609 Other forms of dyspnea: Secondary | ICD-10-CM

## 2020-03-31 DIAGNOSIS — R0600 Dyspnea, unspecified: Secondary | ICD-10-CM

## 2020-03-31 NOTE — Patient Instructions (Signed)
Medication Instructions:  Your physician recommends that you continue on your current medications as directed. Please refer to the Current Medication list given to you today.  *If you need a refill on your cardiac medications before your next appointment, please call your pharmacy*   Lab Work: TODAY: LIPIDS, LFTS, BMET  If you have labs (blood work) drawn today and your tests are completely normal, you will receive your results only by: Marland Kitchen MyChart Message (if you have MyChart) OR . A paper copy in the mail If you have any lab test that is abnormal or we need to change your treatment, we will call you to review the results.   Testing/Procedures: None   Follow-Up: AS NEEDED   Other Instructions None

## 2020-03-31 NOTE — Progress Notes (Signed)
Cardiology Office Note:    Date:  03/31/2020   ID:  Billy Erickson, DOB 10-15-1945, MRN [id number]  PCP:  Jarrett Soho, PA-C  [facility] Cardiologist:  Kristeen Miss, MD  [facility] Electrophysiologist:  None   Referring MD: Iva Boop, MD   Chief Complaint  Patient presents with  . Shortness of Breath     History of Present Illness:    Billy Erickson is a 74 y.o. male with a hx of shortness of breath.  I saw him during a telemedicine visit in April, 2020. We started him on an albuterol inhaler.  We scheduled him for an echocardiogram, stress test. Echocardiogram performed December 26, 2018 reveals normal left ventricular function.  He has mild aortic insufficiency.  Coronary CT angiogram reveals a coronary calcium score of 168 Agatston units (50th percentile for age/gender matched controls)  The LAD has mild mixed plaque. The circumflex artery has mild mixed plaque with no significant stenosis.  The right coronary artery has no stenosis.  His shortness of breath is unchanged.  Non smoker .  Has not gone back to his primary MD yet  He exercises regularly  Walks the dog twice a day  Elliptical  - 20 minutes once a day    Past Medical History:  Diagnosis Date  . Aortic regurgitation    mild to moderate AR 05/29/16 echo [facility])  . Arthritis   . Dyspnea    with exercise only  . Enlarged prostate   . GERD (gastroesophageal reflux disease)   . History of kidney stones   . Hyperlipidemia   . Kidney stones   . Liver cyst   . Wrist pain, right     Past Surgical History:  Procedure Laterality Date  . ANTERIOR CRUCIATE LIGAMENT REPAIR    . ANTERIOR LAT LUMBAR FUSION Right 01/08/2018   Procedure: ANTERIOR LATERAL INTERBODY FUSION LUMBAR THREE- LUMBAR FOUR, LUMBAR FOUR- LUMBAR FIVE;  Surgeon: Lisbeth Renshaw, MD;  [facility];  Service: Neurosurgery;  Laterality: Right;  LUMBAR INTERBODY FUSION  . APPLICATION OF ROBOTIC ASSISTANCE FOR SPINAL  PROCEDURE N/A 01/08/2018   Procedure: APPLICATION OF ROBOTIC ASSISTANCE FOR SPINAL PROCEDURE;  Surgeon: Lisbeth Renshaw, MD;  [facility];  Service: Neurosurgery;  Laterality: N/A;  APPLICATION OF ROBOTIC ASSISTANCE FOR SPINAL PROCEDURE  . CYSTOSCOPY/RETROGRADE/URETEROSCOPY  02/13/2008   basketing of fragments  . EXTRACORPOREAL SHOCK WAVE LITHOTRIPSY Left 03/19/2017   Procedure: LEFT EXTRACORPOREAL SHOCK WAVE LITHOTRIPSY (ESWL);  Surgeon: Crist Fat, MD;  Location: [facility];  Service: Urology;  Laterality: Left;  . KNEE ARTHROSCOPY WITH MEDIAL MENISECTOMY Left 08/18/2016   Procedure: LEFT KNEE ARTHROSCOPY, CHONDROPLASTY AND PARTIAL MEDIAL AND LATERAL MENISCECTOMY;  Surgeon: Sheral Apley, MD;  Location: [facility];  Service: Orthopedics;  Laterality: Left;  . KNEE SURGERY Right   . LUMBAR PERCUTANEOUS PEDICLE SCREW 3 LEVEL N/A 01/08/2018   Procedure: LUMBAR PERCUTANEOUS PEDICLE SCREW PLACEMENT LUMBAR THREE-FOUR, LUMBAR FOUR-FIVE, LUMBAR FIVE-SACRAL ONE;  Surgeon: Lisbeth Renshaw, MD;  [facility];  Service: Neurosurgery;  Laterality: N/A;  . MRI    . nuclear stress test    . TONSILLECTOMY    . TRANSFORAMINAL LUMBAR INTERBODY FUSION (TLIF) WITH PEDICLE SCREW FIXATION 1 LEVEL N/A 01/08/2018   Procedure: TRANSFORAMINAL LUMBAR INTERBODY FUSION LUMBAR FIVE-SACRAL ONE;  Surgeon: Lisbeth Renshaw, MD;  [facility];  Service: Neurosurgery;  Laterality: N/A;  . WRIST ARTHROSCOPY WITH ULNA SHORTENING Right 07/06/2015   Procedure: RIGHT WRIST WITH  ARTHROSCOPIC DEBRIDEMENT  AND ULNAR SHORTENING OSTEOTOMY;  Surgeon: Mack Hookavid Thompson, MD;  Location: [facility];  Service: Orthopedics;  Laterality: Right;    Current Medications: Current Meds  Medication Sig  . Albuterol Sulfate (PROAIR RESPICLICK) 108 (90 Base) MCG/ACT AEPB Inhale 2 puffs into the lungs every 6 (six) hours as needed (wheezing).  Marland Kitchen. aspirin EC 81 MG tablet Take 1 tablet (81 mg total) by  mouth daily.  Marland Kitchen. gabapentin (NEURONTIN) 300 MG capsule Take 300 mg by mouth at bedtime.   . Glucosamine-Chondroit-Vit C-Mn (GLUCOSAMINE 1500 COMPLEX PO) Take 1 tablet by mouth daily.  Marland Kitchen. ibuprofen (ADVIL,MOTRIN) 100 MG tablet Take 200 mg by mouth every 6 (six) hours as needed for pain or fever.  . methocarbamol (ROBAXIN) 500 MG tablet Take 1 tablet (500 mg total) by mouth every 6 (six) hours as needed for muscle spasms.  . Multiple Vitamin (MULTIVITAMIN WITH MINERALS) TABS tablet Take 1 tablet by mouth daily.  Marland Kitchen. omeprazole (PRILOSEC) 20 MG capsule Take 20 mg by mouth daily.  . rosuvastatin (CRESTOR) 10 MG tablet Take 1 tablet (10 mg total) by mouth daily. Please keep upcoming appt in September with Dr. Elease HashimotoNahser before anymore refills. Final Attempt  . tamsulosin (FLOMAX) 0.4 MG CAPS capsule Take 0.4 mg by mouth daily.  Marland Kitchen. tiZANidine (ZANAFLEX) 4 MG tablet Take 4 mg by mouth every 6 (six) hours as needed for muscle spasms.   Marland Kitchen. zolpidem (AMBIEN CR) 6.25 MG CR tablet Take 6.25 mg by mouth at bedtime as needed for sleep.      Allergies:   Sudafed [pseudoephedrine]   Social History   Socioeconomic History  . Marital status: Married    Spouse name: Not on file  . Number of children: Not on file  . Years of education: Not on file  . Highest education level: Not on file  Occupational History  . Not on file  Tobacco Use  . Smoking status: Never Smoker  . Smokeless tobacco: Never Used  Vaping Use  . Vaping Use: Never used  Substance and Sexual Activity  . Alcohol use: Yes    Alcohol/week: 6.0 standard drinks    Types: 6 Glasses of wine per week    Comment: social  . Drug use: No  . Sexual activity: Not on file  Other Topics Concern  . Not on file  Social History Narrative  . Not on file   Social Determinants of Health   Financial Resource Strain:   . Difficulty of Paying Living Expenses: Not on file  Food Insecurity:   . Worried About Programme researcher, broadcasting/film/videounning Out of Food in the Last Year: Not on file   . Ran Out of Food in the Last Year: Not on file  Transportation Needs:   . Lack of Transportation (Medical): Not on file  . Lack of Transportation (Non-Medical): Not on file  Physical Activity:   . Days of Exercise per Week: Not on file  . Minutes of Exercise per Session: Not on file  Stress:   . Feeling of Stress : Not on file  Social Connections:   . Frequency of Communication with Friends and Family: Not on file  . Frequency of Social Gatherings with Friends and Family: Not on file  . Attends Religious Services: Not on file  . Active Member of Clubs or Organizations: Not on file  . Attends BankerClub or Organization Meetings: Not on file  . Marital Status: Not on file     Family History: The patient's family history includes Alzheimer's  disease in his mother.  ROS:   Please see the history of present illness.     All other systems reviewed and are negative.  EKGs/Labs/Other Studies Reviewed:    The following studies were reviewed today:   EKG:   Sept. 8, 2021:  NSR at 60.  No ST or T wave changes .   Recent Labs: No results found for requested labs within last 8760 hours.  Recent Lipid Panel No results found for: CHOL, TRIG, HDL, CHOLHDL, VLDL, LDLCALC, LDLDIRECT  Physical Exam:    VS:  BP 100/72   Pulse 60   Ht 5\' 10"  (1.778 m)   Wt 199 lb (90.3 kg)   SpO2 95%   BMI 28.55 kg/m     Wt Readings from Last 3 Encounters:  03/31/20 199 lb (90.3 kg)  11/07/18 199 lb (90.3 kg)  04/18/18 197 lb 8 oz (89.6 kg)     GEN:  Well nourished, well developed in no acute distress HEENT: Normal NECK: No JVD; No carotid bruits LYMPHATICS: No lymphadenopathy CARDIAC: RRR, no murmurs, rubs, gallops RESPIRATORY:  Clear to auscultation without rales, wheezing or rhonchi  ABDOMEN: Soft, non-tender, non-distended MUSCULOSKELETAL:  No edema; No deformity  SKIN: Warm and dry NEUROLOGIC:  Alert and oriented x 3 PSYCHIATRIC:  Normal affect   ASSESSMENT:    1. DOE (dyspnea on  exertion)   2. Hyperlipidemia, unspecified hyperlipidemia type    PLAN:    In order of problems listed above:  1. Dyspnea with exertion: 04/20/18 presents for further evaluation of his consistent and persistent dyspnea on exertion.  His echocardiogram revealed normal left ventricular function.  He has mild aortic insufficiency.  He did not have any other significant valvular disease.  Coronary CT angiogram revealed only mild to moderate coronary artery disease.  His coronary calcium score places him in the 50th percentile.  At this point I do not think that his shortness of breath with exertion can be attributed to a cardiac abnormality.  We discussed possibly sending him for a cardiopulmonary stress test.  He would like to talk to his primary medical doctor and discuss this further.  Alternatively, he could be referred to pulmonary and see if he has any lung issues.  2.  Hyperlipidemia: We placed him on rosuvastatin last year.  He does have a coronary calcium score of 164.  Will check lipids, liver enzymes, basic metabolic profile today.  I will see him on an as-needed basis.  He will follow-up with his general medical doctor.  I am happy to see him in the future if needed.   Medication Adjustments/Labs and Tests Ordered: Current medicines are reviewed at length with the patient today.  Concerns regarding medicines are outlined above.  Orders Placed This Encounter  Procedures  . Basic metabolic panel  . Hepatic function panel  . Lipid panel  . EKG 12-Lead   No orders of the defined types were placed in this encounter.   Patient Instructions  Medication Instructions:  Your physician recommends that you continue on your current medications as directed. Please refer to the Current Medication list given to you today.  *If you need a refill on your cardiac medications before your next appointment, please call your pharmacy*   Lab Work: TODAY: LIPIDS, LFTS, BMET  If you have labs  (blood work) drawn today and your tests are completely normal, you will receive your results only by: Annette Stable MyChart Message (if you have MyChart) OR . A paper copy in  the mail If you have any lab test that is abnormal or we need to change your treatment, we will call you to review the results.   Testing/Procedures: None   Follow-Up: AS NEEDED   Other Instructions None      Signed, Kristeen Miss, MD  03/31/2020 2:31 PM    [facility]

## 2020-04-01 ENCOUNTER — Telehealth: Payer: Self-pay | Admitting: *Deleted

## 2020-04-01 DIAGNOSIS — E785 Hyperlipidemia, unspecified: Secondary | ICD-10-CM

## 2020-04-01 LAB — BASIC METABOLIC PANEL
BUN/Creatinine Ratio: 21 (ref 10–24)
BUN: 22 mg/dL (ref 8–27)
CO2: 23 mmol/L (ref 20–29)
Calcium: 9.3 mg/dL (ref 8.6–10.2)
Chloride: 107 mmol/L — ABNORMAL HIGH (ref 96–106)
Creatinine, Ser: 1.04 mg/dL (ref 0.76–1.27)
GFR calc Af Amer: 83 mL/min/{1.73_m2} (ref >59)
GFR calc non Af Amer: 71 mL/min/{1.73_m2} (ref >59)
Glucose: 86 mg/dL (ref 65–99)
Potassium: 4.4 mmol/L (ref 3.5–5.2)
Sodium: 143 mmol/L (ref 134–144)

## 2020-04-01 LAB — HEPATIC FUNCTION PANEL
ALT: 36 IU/L (ref 0–44)
AST: 34 IU/L (ref 0–40)
Albumin: 4.2 g/dL (ref 3.7–4.7)
Alkaline Phosphatase: 64 IU/L (ref 48–121)
Bilirubin Total: 0.5 mg/dL (ref 0.0–1.2)
Bilirubin, Direct: 0.14 mg/dL (ref 0.00–0.40)
Total Protein: 6.6 g/dL (ref 6.0–8.5)

## 2020-04-01 LAB — LIPID PANEL
Chol/HDL Ratio: 3.4 ratio (ref 0.0–5.0)
Cholesterol, Total: 179 mg/dL (ref 100–199)
HDL: 53 mg/dL (ref >39)
LDL Chol Calc (NIH): 100 mg/dL — ABNORMAL HIGH (ref 0–99)
Triglycerides: 150 mg/dL — ABNORMAL HIGH (ref 0–149)
VLDL Cholesterol Cal: 26 mg/dL (ref 5–40)

## 2020-04-01 MED ORDER — EZETIMIBE 10 MG PO TABS: 10.0000 mg | ORAL_TABLET | Freq: Every day | ORAL | 3 refills | Status: DC

## 2020-04-01 NOTE — Telephone Encounter (Signed)
-----   Message from Vesta Mixer, MD sent at 04/01/2020  4:35 PM EDT ----- LDL is 100. He has mild CAD. His LDL goal is < 70 Add zetia 10 mg to his rosuvastatin  Lipid, liver, bmp in 3 months

## 2020-04-01 NOTE — Telephone Encounter (Signed)
Patient notified.  He will start Zetia 10 mg daily and come in for fasting lab work on 07/01/20.  Will send prescription to [facility] [employer] on [address]

## 2020-04-21 ENCOUNTER — Other Ambulatory Visit: Payer: Self-pay | Admitting: Cardiovascular Disease

## 2020-05-10 DIAGNOSIS — N23 Unspecified renal colic: Secondary | ICD-10-CM | POA: Diagnosis not present

## 2020-05-10 DIAGNOSIS — R1084 Generalized abdominal pain: Secondary | ICD-10-CM | POA: Diagnosis not present

## 2020-05-11 DIAGNOSIS — L720 Epidermal cyst: Secondary | ICD-10-CM | POA: Diagnosis not present

## 2020-05-11 DIAGNOSIS — L814 Other melanin hyperpigmentation: Secondary | ICD-10-CM | POA: Diagnosis not present

## 2020-05-11 DIAGNOSIS — D1801 Hemangioma of skin and subcutaneous tissue: Secondary | ICD-10-CM | POA: Diagnosis not present

## 2020-05-11 DIAGNOSIS — L918 Other hypertrophic disorders of the skin: Secondary | ICD-10-CM | POA: Diagnosis not present

## 2020-05-11 DIAGNOSIS — D225 Melanocytic nevi of trunk: Secondary | ICD-10-CM | POA: Diagnosis not present

## 2020-05-14 DIAGNOSIS — R1031 Right lower quadrant pain: Secondary | ICD-10-CM | POA: Diagnosis not present

## 2020-05-14 DIAGNOSIS — N200 Calculus of kidney: Secondary | ICD-10-CM | POA: Diagnosis not present

## 2020-05-21 ENCOUNTER — Other Ambulatory Visit: Payer: Self-pay | Admitting: Cardiovascular Disease

## 2020-07-01 ENCOUNTER — Other Ambulatory Visit: Payer: Medicare PPO | Admitting: *Deleted

## 2020-07-01 ENCOUNTER — Other Ambulatory Visit: Payer: Self-pay

## 2020-07-01 DIAGNOSIS — E785 Hyperlipidemia, unspecified: Secondary | ICD-10-CM | POA: Diagnosis not present

## 2020-07-02 LAB — HEPATIC FUNCTION PANEL
ALT: 26 IU/L (ref 0–44)
AST: 26 IU/L (ref 0–40)
Albumin: 4.5 g/dL (ref 3.7–4.7)
Alkaline Phosphatase: 70 IU/L (ref 44–121)
Bilirubin Total: 0.4 mg/dL (ref 0.0–1.2)
Bilirubin, Direct: 0.13 mg/dL (ref 0.00–0.40)
Total Protein: 6.9 g/dL (ref 6.0–8.5)

## 2020-07-02 LAB — BASIC METABOLIC PANEL
BUN/Creatinine Ratio: 16 (ref 10–24)
BUN: 18 mg/dL (ref 8–27)
CO2: 23 mmol/L (ref 20–29)
Calcium: 9.6 mg/dL (ref 8.6–10.2)
Chloride: 106 mmol/L (ref 96–106)
Creatinine, Ser: 1.16 mg/dL (ref 0.76–1.27)
GFR calc Af Amer: 72 mL/min/{1.73_m2} (ref >59)
GFR calc non Af Amer: 63 mL/min/{1.73_m2} (ref >59)
Glucose: 96 mg/dL (ref 65–99)
Potassium: 4.3 mmol/L (ref 3.5–5.2)
Sodium: 142 mmol/L (ref 134–144)

## 2020-07-02 LAB — LIPID PANEL
Chol/HDL Ratio: 2.8 ratio (ref 0.0–5.0)
Cholesterol, Total: 166 mg/dL (ref 100–199)
HDL: 59 mg/dL (ref >39)
LDL Chol Calc (NIH): 90 mg/dL (ref 0–99)
Triglycerides: 91 mg/dL (ref 0–149)
VLDL Cholesterol Cal: 17 mg/dL (ref 5–40)

## 2020-07-30 DIAGNOSIS — G4700 Insomnia, unspecified: Secondary | ICD-10-CM | POA: Diagnosis not present

## 2020-07-30 DIAGNOSIS — N529 Male erectile dysfunction, unspecified: Secondary | ICD-10-CM | POA: Diagnosis not present

## 2020-07-30 DIAGNOSIS — Z0000 Encounter for general adult medical examination without abnormal findings: Secondary | ICD-10-CM | POA: Diagnosis not present

## 2020-07-30 DIAGNOSIS — M5441 Lumbago with sciatica, right side: Secondary | ICD-10-CM | POA: Diagnosis not present

## 2020-07-30 DIAGNOSIS — N400 Enlarged prostate without lower urinary tract symptoms: Secondary | ICD-10-CM | POA: Diagnosis not present

## 2020-07-30 DIAGNOSIS — G8929 Other chronic pain: Secondary | ICD-10-CM | POA: Diagnosis not present

## 2020-07-30 DIAGNOSIS — K219 Gastro-esophageal reflux disease without esophagitis: Secondary | ICD-10-CM | POA: Diagnosis not present

## 2020-07-30 DIAGNOSIS — M5442 Lumbago with sciatica, left side: Secondary | ICD-10-CM | POA: Diagnosis not present

## 2020-07-30 DIAGNOSIS — E785 Hyperlipidemia, unspecified: Secondary | ICD-10-CM | POA: Diagnosis not present

## 2020-08-18 DIAGNOSIS — M792 Neuralgia and neuritis, unspecified: Secondary | ICD-10-CM | POA: Diagnosis not present

## 2020-08-18 DIAGNOSIS — M47816 Spondylosis without myelopathy or radiculopathy, lumbar region: Secondary | ICD-10-CM | POA: Diagnosis not present

## 2020-08-18 DIAGNOSIS — M961 Postlaminectomy syndrome, not elsewhere classified: Secondary | ICD-10-CM | POA: Diagnosis not present

## 2020-08-18 DIAGNOSIS — M5416 Radiculopathy, lumbar region: Secondary | ICD-10-CM | POA: Diagnosis not present

## 2020-08-19 ENCOUNTER — Telehealth: Payer: Self-pay | Admitting: Cardiovascular Disease

## 2020-08-19 ENCOUNTER — Other Ambulatory Visit: Payer: Self-pay

## 2020-08-19 ENCOUNTER — Encounter (HOSPITAL_COMMUNITY): Payer: Self-pay | Admitting: Emergency Medicine

## 2020-08-19 ENCOUNTER — Emergency Department (HOSPITAL_COMMUNITY)
Admission: EM | Admit: 2020-08-19 | Discharge: 2020-08-20 | Disposition: A | Discharge status: Home / Self Care | Payer: Medicare PPO | Attending: Emergency Medicine | Admitting: Emergency Medicine

## 2020-08-19 ENCOUNTER — Emergency Department (HOSPITAL_COMMUNITY): Payer: Medicare PPO

## 2020-08-19 DIAGNOSIS — R079 Chest pain, unspecified: Secondary | ICD-10-CM | POA: Insufficient documentation

## 2020-08-19 DIAGNOSIS — Z5321 Procedure and treatment not carried out due to patient leaving prior to being seen by health care provider: Secondary | ICD-10-CM | POA: Insufficient documentation

## 2020-08-19 LAB — BASIC METABOLIC PANEL
Anion gap: 9 (ref 5–15)
BUN: 18 mg/dL (ref 8–23)
CO2: 26 mmol/L (ref 22–32)
Calcium: 9.3 mg/dL (ref 8.9–10.3)
Chloride: 106 mmol/L (ref 98–111)
Creatinine, Ser: 1.08 mg/dL (ref 0.61–1.24)
GFR, Estimated: >60 mL/min (ref >60)
Glucose, Bld: 98 mg/dL (ref 70–99)
Potassium: 4.1 mmol/L (ref 3.5–5.1)
Sodium: 141 mmol/L (ref 135–145)

## 2020-08-19 LAB — CBC
HCT: 45.2 % (ref 39.0–52.0)
Hemoglobin: 15.5 g/dL (ref 13.0–17.0)
MCH: 31.9 pg (ref 26.0–34.0)
MCHC: 34.3 g/dL (ref 30.0–36.0)
MCV: 93 fL (ref 80.0–100.0)
Platelets: 154 10*3/uL (ref 150–400)
RBC: 4.86 MIL/uL (ref 4.22–5.81)
RDW: 12.2 % (ref 11.5–15.5)
WBC: 4.1 10*3/uL (ref 4.0–10.5)
nRBC: 0 % (ref 0.0–0.2)

## 2020-08-19 LAB — TROPONIN I (HIGH SENSITIVITY): Troponin I (High Sensitivity): 4 ng/L (ref <18)

## 2020-08-19 NOTE — Telephone Encounter (Signed)
Patient called to discuss current symptoms related to worsening chest pain. Patient states that he was having low back pain along with the chest pain. He seen orthopedic and pain specialist who did not feel the pain was musculoskeletal related and encouraged patient to contact cardiology office. Patient reports the chest pain has become more frequent and more sharp. Patient does not have nitroglycerin at home, but did take a baby aspirin prior to calling. Chest pain is not subsiding, and patient reports worsening during telephone call. RN advised patient to go to the ED due to worsening symptoms.   Follow-up appointment scheduled with Dr.Nahser for 08/20/2020 in anticipation patient may not be admitted today.

## 2020-08-19 NOTE — Telephone Encounter (Signed)
Pt c/o of Chest Pain: STAT if CP now or developed within 24 hours  1. Are you having CP right now? Yes   2. Are you experiencing any other symptoms (ex. SOB, nausea, vomiting, sweating)? No   3. How long have you been experiencing CP? Off and on for the past few weeks   4. Is your CP continuous or coming and going? Coming and going.  Will occur for a couple hours, but doesn't occur everyday. States it is becoming more frequent   5. Have you taken Nitroglycerin? No, but has taken some aspirin.  ?

## 2020-08-19 NOTE — ED Triage Notes (Signed)
Patient complaint of left sided chest pain for several weeks, worse today.

## 2020-08-19 NOTE — Telephone Encounter (Signed)
Agree with Terrilyn Saver, RN that he should be seen in the ER .

## 2020-08-19 NOTE — ED Notes (Signed)
Patient came up to this tech and said he had been waiting too long and he had to leave. This tech told pt that he should wait to see a doctor before leaving. Patient said he didn't want to wait anymore and left.

## 2020-08-19 NOTE — Telephone Encounter (Signed)
Attempted to call [facility] triage for STAT call. Spoke with Dorathy Daft who requested I reach out to [facility] due to her not being able to take it since she is a CMA. I then reached out to [place] who stated she was unavailable due to still working on STAT call. Please advise.

## 2020-08-20 ENCOUNTER — Ambulatory Visit: Payer: Medicare PPO | Admitting: Cardiovascular Disease

## 2020-09-06 DIAGNOSIS — M5416 Radiculopathy, lumbar region: Secondary | ICD-10-CM | POA: Diagnosis not present

## 2020-09-28 DIAGNOSIS — M7918 Myalgia, other site: Secondary | ICD-10-CM | POA: Diagnosis not present

## 2020-09-28 DIAGNOSIS — M5416 Radiculopathy, lumbar region: Secondary | ICD-10-CM | POA: Diagnosis not present

## 2020-09-28 DIAGNOSIS — M961 Postlaminectomy syndrome, not elsewhere classified: Secondary | ICD-10-CM | POA: Diagnosis not present

## 2020-09-28 DIAGNOSIS — M546 Pain in thoracic spine: Secondary | ICD-10-CM | POA: Diagnosis not present

## 2020-09-28 DIAGNOSIS — G8929 Other chronic pain: Secondary | ICD-10-CM | POA: Diagnosis not present

## 2020-09-30 DIAGNOSIS — M5416 Radiculopathy, lumbar region: Secondary | ICD-10-CM | POA: Diagnosis not present

## 2020-09-30 DIAGNOSIS — M7918 Myalgia, other site: Secondary | ICD-10-CM | POA: Diagnosis not present

## 2020-11-01 ENCOUNTER — Other Ambulatory Visit: Payer: Self-pay | Admitting: Nurse Practitioner

## 2020-11-01 ENCOUNTER — Telehealth: Payer: Self-pay

## 2020-11-01 DIAGNOSIS — M5416 Radiculopathy, lumbar region: Secondary | ICD-10-CM

## 2020-11-01 NOTE — Telephone Encounter (Signed)
Phone call to patient to verify medication list and allergies for myelogram procedure. All medications pt is currently taking is safe to continue to take for myelogram procedure. Pt verbalized understanding. Pt also advised he would be with us around 2 hours, to have a driver the day of the procedure, and he would need to lay flat for 24 hours post procedure. Pt verbalized understanding.  

## 2020-11-05 ENCOUNTER — Ambulatory Visit
Admission: RE | Admit: 2020-11-05 | Discharge: 2020-11-05 | Disposition: A | Discharge status: Home / Self Care | Payer: Medicare PPO | Source: Ambulatory Visit | Attending: Nurse Practitioner | Admitting: Nurse Practitioner

## 2020-11-05 DIAGNOSIS — M48061 Spinal stenosis, lumbar region without neurogenic claudication: Secondary | ICD-10-CM | POA: Diagnosis not present

## 2020-11-05 DIAGNOSIS — M5416 Radiculopathy, lumbar region: Secondary | ICD-10-CM

## 2020-11-05 DIAGNOSIS — M5136 Other intervertebral disc degeneration, lumbar region: Secondary | ICD-10-CM | POA: Diagnosis not present

## 2020-11-05 DIAGNOSIS — M5126 Other intervertebral disc displacement, lumbar region: Secondary | ICD-10-CM | POA: Diagnosis not present

## 2020-11-05 MED ORDER — IOPAMIDOL (ISOVUE-M 200) INJECTION 41%
18.0000 mL | Freq: Once | INTRAMUSCULAR | Status: AC
  Administered 2020-11-05: 18 mL via INTRATHECAL

## 2020-11-05 MED ORDER — DIAZEPAM 5 MG PO TABS
5.0000 mg | ORAL_TABLET | Freq: Once | ORAL | Status: AC
  Administered 2020-11-05: 5 mg via ORAL

## 2020-11-05 NOTE — Discharge Instructions (Signed)

## 2020-11-09 DIAGNOSIS — M47819 Spondylosis without myelopathy or radiculopathy, site unspecified: Secondary | ICD-10-CM | POA: Diagnosis not present

## 2020-11-30 DIAGNOSIS — M792 Neuralgia and neuritis, unspecified: Secondary | ICD-10-CM | POA: Diagnosis not present

## 2020-11-30 DIAGNOSIS — M5416 Radiculopathy, lumbar region: Secondary | ICD-10-CM | POA: Diagnosis not present

## 2020-11-30 DIAGNOSIS — M961 Postlaminectomy syndrome, not elsewhere classified: Secondary | ICD-10-CM | POA: Diagnosis not present

## 2020-12-06 DIAGNOSIS — M5416 Radiculopathy, lumbar region: Secondary | ICD-10-CM | POA: Diagnosis not present

## 2020-12-06 DIAGNOSIS — Z981 Arthrodesis status: Secondary | ICD-10-CM | POA: Diagnosis not present

## 2020-12-06 DIAGNOSIS — M47816 Spondylosis without myelopathy or radiculopathy, lumbar region: Secondary | ICD-10-CM | POA: Diagnosis not present

## 2021-02-24 DIAGNOSIS — K429 Umbilical hernia without obstruction or gangrene: Secondary | ICD-10-CM | POA: Diagnosis not present

## 2021-03-03 DIAGNOSIS — L720 Epidermal cyst: Secondary | ICD-10-CM | POA: Diagnosis not present

## 2021-03-04 DIAGNOSIS — K429 Umbilical hernia without obstruction or gangrene: Secondary | ICD-10-CM | POA: Diagnosis not present

## 2021-03-17 ENCOUNTER — Other Ambulatory Visit: Payer: Self-pay | Admitting: Cardiovascular Disease

## 2021-03-21 ENCOUNTER — Other Ambulatory Visit: Payer: Self-pay | Admitting: Cardiovascular Disease

## 2021-04-01 ENCOUNTER — Other Ambulatory Visit: Payer: Self-pay | Admitting: Cardiovascular Disease

## 2021-04-19 DIAGNOSIS — M961 Postlaminectomy syndrome, not elsewhere classified: Secondary | ICD-10-CM | POA: Diagnosis not present

## 2021-04-19 DIAGNOSIS — M5416 Radiculopathy, lumbar region: Secondary | ICD-10-CM | POA: Diagnosis not present

## 2021-04-27 ENCOUNTER — Other Ambulatory Visit: Payer: Self-pay | Admitting: Cardiovascular Disease

## 2021-06-28 ENCOUNTER — Encounter: Payer: Self-pay | Admitting: Cardiovascular Disease

## 2021-06-28 ENCOUNTER — Ambulatory Visit: Payer: Medicare PPO | Admitting: Cardiovascular Disease

## 2021-06-28 ENCOUNTER — Other Ambulatory Visit: Payer: Self-pay

## 2021-06-28 VITALS — BP 122/72 | HR 62 | Ht 70.0 in | Wt 195.8 lb

## 2021-06-28 DIAGNOSIS — E785 Hyperlipidemia, unspecified: Secondary | ICD-10-CM | POA: Diagnosis not present

## 2021-06-28 MED ORDER — ROSUVASTATIN CALCIUM 40 MG PO TABS: 40.0000 mg | ORAL_TABLET | Freq: Every day | ORAL | 3 refills | Status: DC

## 2021-06-28 NOTE — Progress Notes (Signed)
Cardiology Office Note:    Date:  06/28/2021   ID:  Billy Erickson, DOB 1946-04-28, MRN [id number]  PCP:  Jarrett Soho, PA-C  [facility] Cardiologist:  Kristeen Miss, MD  [facility] Electrophysiologist:  None   Referring MD: Jarrett Soho, PA-C   Chief Complaint  Patient presents with   Hyperlipidemia     History of Present Illness:    Billy Erickson is a 75 y.o. male with a hx of shortness of breath.  I saw him during a telemedicine visit in April, 2020. We started him on an albuterol inhaler.  We scheduled him for an echocardiogram, stress test. Echocardiogram performed December 26, 2018 reveals normal left ventricular function.  He has mild aortic insufficiency.  Coronary CT angiogram reveals a coronary calcium score of 168 Agatston units (50th percentile for age/gender matched controls)  The LAD has mild mixed plaque. The circumflex artery has mild mixed plaque with no significant stenosis.  The right coronary artery has no stenosis.  His shortness of breath is unchanged.  Non smoker .  Has not gone back to his primary MD yet  He exercises regularly  Walks the dog twice a day  Elliptical  - 20 minutes once a day    June 28, 2021: Billy Erickson is seen back today for follow-up visit.  He has had some shortness of breath.  We performed a coronary CT angiogram last year which revealed some coronary calcifications 168 which put him in the 50th percentile for age and gender matched controls.  He has very mild coronary artery disease involving his LAD and circumflex.  Right coronary artery was free of coronary artery disease.  He exercises on a regular basis.  Has been having lots of neuropathic pain  Had a CT - has scattered plaque in the aorta   Past Medical History:  Diagnosis Date   Aortic regurgitation    mild to moderate AR 05/29/16 echo [facility])   Arthritis    Dyspnea    with exercise only   Enlarged prostate    GERD (gastroesophageal  reflux disease)    History of kidney stones    Hyperlipidemia    Kidney stones    Liver cyst    Wrist pain, right     Past Surgical History:  Procedure Laterality Date   ANTERIOR CRUCIATE LIGAMENT REPAIR     ANTERIOR LAT LUMBAR FUSION Right 01/08/2018   Procedure: ANTERIOR LATERAL INTERBODY FUSION LUMBAR THREE- LUMBAR FOUR, LUMBAR FOUR- LUMBAR FIVE;  Surgeon: Lisbeth Renshaw, MD;  [facility];  Service: Neurosurgery;  Laterality: Right;  LUMBAR INTERBODY FUSION   APPLICATION OF ROBOTIC ASSISTANCE FOR SPINAL PROCEDURE N/A 01/08/2018   Procedure: APPLICATION OF ROBOTIC ASSISTANCE FOR SPINAL PROCEDURE;  Surgeon: Lisbeth Renshaw, MD;  [facility];  Service: Neurosurgery;  Laterality: N/A;  APPLICATION OF ROBOTIC ASSISTANCE FOR SPINAL PROCEDURE   CYSTOSCOPY/RETROGRADE/URETEROSCOPY  02/13/2008   basketing of fragments   EXTRACORPOREAL SHOCK WAVE LITHOTRIPSY Left 03/19/2017   Procedure: LEFT EXTRACORPOREAL SHOCK WAVE LITHOTRIPSY (ESWL);  Surgeon: Crist Fat, MD;  [facility];  Service: Urology;  Laterality: Left;   KNEE ARTHROSCOPY WITH MEDIAL MENISECTOMY Left 08/18/2016   Procedure: LEFT KNEE ARTHROSCOPY, CHONDROPLASTY AND PARTIAL MEDIAL AND LATERAL MENISCECTOMY;  Surgeon: Sheral Apley, MD;  Location: [facility];  Service: Orthopedics;  Laterality: Left;   KNEE SURGERY Right    LUMBAR PERCUTANEOUS PEDICLE SCREW 3 LEVEL N/A 01/08/2018   Procedure: LUMBAR PERCUTANEOUS PEDICLE SCREW PLACEMENT LUMBAR  THREE-FOUR, LUMBAR FOUR-FIVE, LUMBAR FIVE-SACRAL ONE;  Surgeon: Lisbeth Renshaw, MD;  [facility];  Service: Neurosurgery;  Laterality: N/A;   MRI     nuclear stress test     TONSILLECTOMY     TRANSFORAMINAL LUMBAR INTERBODY FUSION (TLIF) WITH PEDICLE SCREW FIXATION 1 LEVEL N/A 01/08/2018   Procedure: TRANSFORAMINAL LUMBAR INTERBODY FUSION LUMBAR FIVE-SACRAL ONE;  Surgeon: Lisbeth Renshaw, MD;  [facility];  Service: Neurosurgery;  Laterality: N/A;    WRIST ARTHROSCOPY WITH ULNA SHORTENING Right 07/06/2015   Procedure: RIGHT WRIST WITH  ARTHROSCOPIC DEBRIDEMENT AND ULNAR SHORTENING OSTEOTOMY;  Surgeon: Mack Hook, MD;  Location: [facility];  Service: Orthopedics;  Laterality: Right;    Current Medications: Current Meds  Medication Sig   Albuterol Sulfate (PROAIR RESPICLICK) 108 (90 Base) MCG/ACT AEPB Inhale 2 puffs into the lungs every 6 (six) hours as needed (wheezing).   aspirin EC 81 MG tablet Take 1 tablet (81 mg total) by mouth daily.   ezetimibe (ZETIA) 10 MG tablet TAKE 1 TABLET BY MOUTH EVERY DAY   gabapentin (NEURONTIN) 300 MG capsule Take 300 mg by mouth at bedtime.    Glucosamine-Chondroit-Vit C-Mn (GLUCOSAMINE 1500 COMPLEX PO) Take 1 tablet by mouth daily.   ibuprofen (ADVIL,MOTRIN) 100 MG tablet Take 200 mg by mouth every 6 (six) hours as needed for pain or fever.   methocarbamol (ROBAXIN) 500 MG tablet Take 1 tablet (500 mg total) by mouth every 6 (six) hours as needed for muscle spasms.   Multiple Vitamin (MULTIVITAMIN WITH MINERALS) TABS tablet Take 1 tablet by mouth daily.   omeprazole (PRILOSEC) 20 MG capsule Take 20 mg by mouth daily.   rosuvastatin (CRESTOR) 40 MG tablet Take 1 tablet (40 mg total) by mouth daily.   sildenafil (REVATIO) 20 MG tablet Take 20 mg by mouth 3 (three) times daily.   tamsulosin (FLOMAX) 0.4 MG CAPS capsule Take 0.4 mg by mouth daily.   zolpidem (AMBIEN CR) 6.25 MG CR tablet Take 6.25 mg by mouth at bedtime as needed for sleep.    [DISCONTINUED] rosuvastatin (CRESTOR) 10 MG tablet Take 1 tablet (10 mg total) by mouth daily. Please keep upcoming appt in December 2022 with Dr. Elease Hashimoto before anymore refills. Thank you     Allergies:   Diphenhydramine hcl and Sudafed [pseudoephedrine]   Social History   Socioeconomic History   Marital status: Married    Spouse name: Not on file   Number of children: Not on file   Years of education: Not on file   Highest education  level: Not on file  Occupational History   Not on file  Tobacco Use   Smoking status: Never   Smokeless tobacco: Never  Vaping Use   Vaping Use: Never used  Substance and Sexual Activity   Alcohol use: Yes    Alcohol/week: 6.0 standard drinks    Types: 6 Glasses of wine per week    Comment: social   Drug use: No   Sexual activity: Not on file  Other Topics Concern   Not on file  Social History Narrative   Not on file   Social Determinants of Health   Financial Resource Strain: Not on file  Food Insecurity: Not on file  Transportation Needs: Not on file  Physical Activity: Not on file  Stress: Not on file  Social Connections: Not on file     Family History: The patient's family history includes Alzheimer's disease in his mother.  ROS:   Please see the  history of present illness.     All other systems reviewed and are negative.  EKGs/Labs/Other Studies Reviewed:    The following studies were reviewed today:   EKG:    June 28, 2021: Normal sinus rhythm at 62.  No ST or T wave changes..   Recent Labs: 07/01/2020: ALT 26 08/19/2020: BUN 18; Creatinine, Ser 1.08; Hemoglobin 15.5; Platelets 154; Potassium 4.1; Sodium 141  Recent Lipid Panel    Component Value Date/Time   CHOL 166 07/01/2020 0723   TRIG 91 07/01/2020 0723   HDL 59 07/01/2020 0723   CHOLHDL 2.8 07/01/2020 0723   LDLCALC 90 07/01/2020 0723    Physical Exam:     Physical Exam: Blood pressure 122/72, pulse 62, height 5\' 10"  (1.778 m), weight 195 lb 12.8 oz (88.8 kg), SpO2 98 %.  GEN:  Well nourished, well developed in no acute distress HEENT: Normal NECK: No JVD; No carotid bruits LYMPHATICS: No lymphadenopathy CARDIAC: RRR , no murmurs, rubs, gallops RESPIRATORY:  Clear to auscultation without rales, wheezing or rhonchi  ABDOMEN: Soft, non-tender, non-distended MUSCULOSKELETAL:  No edema; No deformity  SKIN: Warm and dry NEUROLOGIC:  Alert and oriented x 3   ASSESSMENT:    1.  Hyperlipidemia, unspecified hyperlipidemia type     PLAN:      Dyspnea with exertion:    2.  Hyperlipidemia: He has coronary artery calcifications as well as some calcifications on his aorta.  He is on rosuvastatin 10 and Zetia 10.  We will increase the rosuvastatin to 40 mg a day and check lipids and ALT in 3 months.  If he cannot get his LDL around 50 then we will refer him to the lipid clinic for consideration of PCSK9 inhibitors.   Medication Adjustments/Labs and Tests Ordered: Current medicines are reviewed at length with the patient today.  Concerns regarding medicines are outlined above.  Orders Placed This Encounter  Procedures   Lipid panel   ALT   EKG 12-Lead    Meds ordered this encounter  Medications   rosuvastatin (CRESTOR) 40 MG tablet    Sig: Take 1 tablet (40 mg total) by mouth daily.    Dispense:  90 tablet    Refill:  3    Dose change      Patient Instructions  Medication Instructions:  1) INCREASE Rosuvastatin to 40mg  once daily  *If you need a refill on your cardiac medications before your next appointment, please call your pharmacy*   Lab Work: Lipid and ALT in 3 months.  You will need to be fasting for these labs (nothing to eat or drink after midnight except water and black coffee).  If you have labs (blood work) drawn today and your tests are completely normal, you will receive your results only by: MyChart Message (if you have MyChart) OR A paper copy in the mail If you have any lab test that is abnormal or we need to change your treatment, we will call you to review the results.   Testing/Procedures: None   Follow-Up: At [facility], you and your health needs are our priority.  As part of our continuing mission to provide you with exceptional heart care, we have created designated Provider Care Teams.  These Care Teams include your primary Cardiologist (physician) and Advanced Practice Providers (APPs -  Physician Assistants and Nurse  Practitioners) who all work together to provide you with the care you need, when you need it.  We recommend signing up for the patient portal  called "MyChart".  Sign up information is provided on this After Visit Summary.  MyChart is used to connect with patients for Virtual Visits (Telemedicine).  Patients are able to view lab/test results, encounter notes, upcoming appointments, etc.  Non-urgent messages can be sent to your provider as well.   To learn more about what you can do with MyChart, go to [email or web address].    Your next appointment:   1 year(s)  The format for your next appointment:   In Person  Provider:   Kristeen Miss, MD or APP    Other Instructions     Signed, Kristeen Miss, MD  06/28/2021 10:10 PM    [facility]

## 2021-06-28 NOTE — Patient Instructions (Signed)
Medication Instructions:  1) INCREASE Rosuvastatin to 40mg  once daily  *If you need a refill on your cardiac medications before your next appointment, please call your pharmacy*   Lab Work: Lipid and ALT in 3 months.  You will need to be fasting for these labs (nothing to eat or drink after midnight except water and black coffee).  If you have labs (blood work) drawn today and your tests are completely normal, you will receive your results only by: MyChart Message (if you have MyChart) OR A paper copy in the mail If you have any lab test that is abnormal or we need to change your treatment, we will call you to review the results.   Testing/Procedures: None   Follow-Up: At [facility], you and your health needs are our priority.  As part of our continuing mission to provide you with exceptional heart care, we have created designated Provider Care Teams.  These Care Teams include your primary Cardiologist (physician) and Advanced Practice Providers (APPs -  Physician Assistants and Nurse Practitioners) who all work together to provide you with the care you need, when you need it.  We recommend signing up for the patient portal called "MyChart".  Sign up information is provided on this After Visit Summary.  MyChart is used to connect with patients for Virtual Visits (Telemedicine).  Patients are able to view lab/test results, encounter notes, upcoming appointments, etc.  Non-urgent messages can be sent to your provider as well.   To learn more about what you can do with MyChart, go to [facility].    Your next appointment:   1 year(s)  The format for your next appointment:   In Person  Provider:   [email or web address], MD or APP    Other Instructions

## 2021-09-26 ENCOUNTER — Other Ambulatory Visit: Payer: Medicare PPO | Admitting: *Deleted

## 2021-09-26 ENCOUNTER — Other Ambulatory Visit: Payer: Self-pay

## 2021-09-26 DIAGNOSIS — E785 Hyperlipidemia, unspecified: Secondary | ICD-10-CM

## 2021-09-26 LAB — LIPID PANEL
Chol/HDL Ratio: 3.1 ratio (ref 0.0–5.0)
Cholesterol, Total: 156 mg/dL (ref 100–199)
HDL: 51 mg/dL (ref >39)
LDL Chol Calc (NIH): 87 mg/dL (ref 0–99)
Triglycerides: 100 mg/dL (ref 0–149)
VLDL Cholesterol Cal: 18 mg/dL (ref 5–40)

## 2021-09-26 LAB — ALT: ALT: 40 IU/L (ref 0–44)

## 2021-09-27 ENCOUNTER — Other Ambulatory Visit: Payer: Medicare PPO

## 2021-10-14 ENCOUNTER — Ambulatory Visit: Payer: Medicare PPO | Admitting: Podiatrist

## 2021-10-14 ENCOUNTER — Encounter: Payer: Self-pay | Admitting: Podiatrist

## 2021-10-14 ENCOUNTER — Other Ambulatory Visit: Payer: Self-pay

## 2021-10-14 DIAGNOSIS — K7689 Other specified diseases of liver: Secondary | ICD-10-CM | POA: Insufficient documentation

## 2021-10-14 DIAGNOSIS — Q6671 Congenital pes cavus, right foot: Secondary | ICD-10-CM

## 2021-10-14 DIAGNOSIS — L851 Acquired keratosis [keratoderma] palmaris et plantaris: Secondary | ICD-10-CM | POA: Diagnosis not present

## 2021-10-14 DIAGNOSIS — Q6672 Congenital pes cavus, left foot: Secondary | ICD-10-CM

## 2021-10-14 DIAGNOSIS — J45990 Exercise induced bronchospasm: Secondary | ICD-10-CM | POA: Insufficient documentation

## 2021-10-14 DIAGNOSIS — N529 Male erectile dysfunction, unspecified: Secondary | ICD-10-CM | POA: Insufficient documentation

## 2021-10-14 DIAGNOSIS — G4700 Insomnia, unspecified: Secondary | ICD-10-CM | POA: Insufficient documentation

## 2021-10-14 DIAGNOSIS — R413 Other amnesia: Secondary | ICD-10-CM | POA: Insufficient documentation

## 2021-10-14 DIAGNOSIS — M5430 Sciatica, unspecified side: Secondary | ICD-10-CM | POA: Insufficient documentation

## 2021-10-14 DIAGNOSIS — J329 Chronic sinusitis, unspecified: Secondary | ICD-10-CM | POA: Insufficient documentation

## 2021-10-14 DIAGNOSIS — Z860100 Personal history of colon polyps, unspecified: Secondary | ICD-10-CM | POA: Insufficient documentation

## 2021-10-14 DIAGNOSIS — N400 Enlarged prostate without lower urinary tract symptoms: Secondary | ICD-10-CM | POA: Insufficient documentation

## 2021-10-14 DIAGNOSIS — K219 Gastro-esophageal reflux disease without esophagitis: Secondary | ICD-10-CM | POA: Insufficient documentation

## 2021-10-14 DIAGNOSIS — G8929 Other chronic pain: Secondary | ICD-10-CM | POA: Insufficient documentation

## 2021-10-14 DIAGNOSIS — N200 Calculus of kidney: Secondary | ICD-10-CM | POA: Insufficient documentation

## 2021-10-14 DIAGNOSIS — N3281 Overactive bladder: Secondary | ICD-10-CM | POA: Insufficient documentation

## 2021-10-14 DIAGNOSIS — R1084 Generalized abdominal pain: Secondary | ICD-10-CM | POA: Insufficient documentation

## 2021-10-14 NOTE — Progress Notes (Signed)
?Chief Complaint  ?Patient presents with  ? Foot Pain  ?  Sub 5th MPJ left - tiny, callused area x few weeks, tender walking, no treatment - patient has visible wart plantar forefoot, but did not mention  ? ? ? ?HPI: Patient is 76 y.o. male who presents today for small area of hard tissue on the plantar lateral aspect of the left foot.  He states its been there for several weeks is painful with walking and in shoes. ? ? ?Patient Active Problem List  ? Diagnosis Date Noted  ? Amnesia 10/14/2021  ? Benign prostatic hyperplasia without lower urinary tract symptoms 10/14/2021  ? Chronic pain 10/14/2021  ? Chronic sinusitis 10/14/2021  ? ED (erectile dysfunction) of organic origin 10/14/2021  ? Exercise induced bronchospasm 10/14/2021  ? Gastroesophageal reflux disease 10/14/2021  ? Generalized abdominal pain 10/14/2021  ? Hepatic cyst 10/14/2021  ? Insomnia 10/14/2021  ? Kidney stone 10/14/2021  ? Overactive bladder 10/14/2021  ? Personal history of colonic polyps 10/14/2021  ? Sciatica 10/14/2021  ? Coronary artery calcification 03/31/2020  ? Hyperlipidemia 03/31/2020  ? DOE (dyspnea on exertion) 11/07/2018  ? Chest tightness 11/07/2018  ? Lumbar radiculopathy 01/08/2018  ? Abnormal cardiovascular stress test 05/23/2016  ? Dyspnea 05/23/2016  ? Acute meniscal tear of knee 11/12/2012  ? Knee pain 09/24/2012  ? ? ?Current Outpatient Medications on File Prior to Visit  ?Medication Sig Dispense Refill  ? Albuterol Sulfate (PROAIR RESPICLICK) 108 (90 Base) MCG/ACT AEPB Inhale 2 puffs into the lungs every 6 (six) hours as needed (wheezing). 1 each 2  ? aspirin EC 81 MG tablet Take 1 tablet (81 mg total) by mouth daily. 30 tablet 0  ? ezetimibe (ZETIA) 10 MG tablet TAKE 1 TABLET BY MOUTH EVERY DAY 90 tablet 3  ? gabapentin (NEURONTIN) 300 MG capsule Take 300 mg by mouth at bedtime.   1  ? Glucosamine-Chondroit-Vit C-Mn (GLUCOSAMINE 1500 COMPLEX PO) Take 1 tablet by mouth daily.    ? ibuprofen (ADVIL,MOTRIN) 100 MG tablet  Take 200 mg by mouth every 6 (six) hours as needed for pain or fever.    ? methocarbamol (ROBAXIN) 500 MG tablet Take 1 tablet (500 mg total) by mouth every 6 (six) hours as needed for muscle spasms. 90 tablet 2  ? Multiple Vitamin (MULTIVITAMIN WITH MINERALS) TABS tablet Take 1 tablet by mouth daily.    ? omeprazole (PRILOSEC) 20 MG capsule Take 20 mg by mouth daily.    ? rosuvastatin (CRESTOR) 40 MG tablet Take 1 tablet (40 mg total) by mouth daily. 90 tablet 3  ? sildenafil (REVATIO) 20 MG tablet Take 20 mg by mouth 3 (three) times daily.    ? tamsulosin (FLOMAX) 0.4 MG CAPS capsule Take 0.4 mg by mouth daily.  3  ? tiZANidine (ZANAFLEX) 4 MG tablet Take 4 mg by mouth every 6 (six) hours as needed for muscle spasms.  (Patient not taking: Reported on 06/28/2021)  1  ? zolpidem (AMBIEN CR) 6.25 MG CR tablet Take 6.25 mg by mouth at bedtime as needed for sleep.   0  ? ?No current facility-administered medications on file prior to visit.  ? ? ?Allergies  ?Allergen Reactions  ? Diphenhydramine Hcl   ?  Other reaction(s): Other (See Comments)  ? Sudafed [Pseudoephedrine] Anxiety and Other (See Comments)  ?  Anxious, unable to urinate,"jittery"  ? ? ?Review of Systems ?No fevers, chills, nausea, muscle aches, no difficulty breathing, no calf pain, no chest pain or shortness  of breath. ? ? ?Physical Exam ? ?GENERAL APPEARANCE: Alert, conversant. Appropriately groomed. No acute distress.  ? ?VASCULAR: Pedal pulses palpable DP and PT bilateral.  Capillary refill time is immediate to all digits,  Proximal to distal cooling it warm to warm.  Digital perfusion adequate.  ? ?NEUROLOGIC: sensation is intact to 5.07 monofilament at 5/5 sites bilateral.  Light touch is intact bilateral, vibratory sensation intact bilateral ? ?MUSCULOSKELETAL: High arch foot type with forefoot cavus noted bilateral.  Prominent first and fifth metatarsal areas noted bilateral.   No pain, crepitus or limitation noted with foot and ankle range of  motion bilateral.  ? ?DERMATOLOGIC: skin is warm, supple, and dry.  Color, texture, and turgor of skin within normal limits.  No open wounds are noted.  Hyperkeratotic intractable plantar keratotic lesion is present submetatarsal 5 of the left foot.  Pain with direct pressure and medial to lateral compression is noted. ? ? ?Assessment  ? ?  ICD-10-CM   ?1. 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  ?2. 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 Q66.72   ?  ? ? ? ?Plan ? ?Discussed exam findings with the patient and at today's visit I recommended excising the lytic lesion which was accomplished today without anesthesia with a #15 blade without complication.  No iatrogenic bleeding was noted postdebridement.  I also applied a pad to his orthotic insert to offload the area for the future.  Discussed he may benefit from new orthotics with an offloading pocket at the fifth metatarsal head of the left foot.  If the area returns he will call us and let us know otherwise he will be seen back as needed for follow-up.   ?

## 2021-10-14 NOTE — Patient Instructions (Signed)
Corns and Calluses Corns are small areas of thickened skin that form on the top, sides, or tip of a toe. Corns have a cone-shaped core with a point that can press on a nerve below. This causes pain. Calluses are areas of thickened skin that can form anywhere on the body, including the hands, fingers, palms, soles of the feet, and heels. Calluses are usually larger than corns. What are the causes? Corns and calluses are caused by rubbing (friction) or pressure, such as from shoes that are too tight or do not fit properly. What increases the risk? Corns are more likely to develop in people who have misshapen toes (toe deformities), such as hammer toes. Calluses can form with friction to any area of the skin. They are more likely to develop in people who: Work with their hands. Wear shoes that fit poorly, are too tight, or are high-heeled. Have toe deformities. What are the signs or symptoms? Symptoms of a corn or callus include: A hard growth on the skin. Pain or tenderness under the skin. Redness and swelling. Increased discomfort while wearing tight-fitting shoes, if your feet are affected. If a corn or callus becomes infected, symptoms may include: Redness and swelling that gets worse. Pain. Fluid, blood, or pus draining from the corn or callus. How is this diagnosed? Corns and calluses may be diagnosed based on your symptoms, your medical history, and a physical exam. How is this treated? Treatment for corns and calluses may include: Removing the cause of the friction or pressure. This may involve: Changing your shoes. Wearing shoe inserts (orthotics) or other protective layers in your shoes, such as a corn pad. Wearing gloves. Applying medicine to the skin (topical medicine) to help soften skin in the hardened, thickened areas. Removing layers of dead skin with a file to reduce the size of the corn or callus. Removing the corn or callus with a scalpel or laser. Taking antibiotic  medicines, if your corn or callus is infected. Having surgery, if a toe deformity is the cause. Follow these instructions at home:  Take over-the-counter and prescription medicines only as told by your health care provider. If you were prescribed an antibiotic medicine, take it as told by your health care provider. Do not stop taking it even if your condition improves. Wear shoes that fit well. Avoid wearing high-heeled shoes and shoes that are too tight or too loose. Wear any padding, protective layers, gloves, or orthotics as told by your health care provider. Soak your hands or feet. Then use a file or pumice stone to soften your corn or callus. Do this as told by your health care provider. Check your corn or callus every day for signs of infection. Contact a health care provider if: Your symptoms do not improve with treatment. You have redness or swelling that gets worse. Your corn or callus becomes painful. You have fluid, blood, or pus coming from your corn or callus. You have new symptoms. Get help right away if: You develop severe pain with redness. Summary Corns are small areas of thickened skin that form on the top, sides, or tip of a toe. These can be painful. Calluses are areas of thickened skin that can form anywhere on the body, including the hands, fingers, palms, and soles of the feet. Calluses are usually larger than corns. Corns and calluses are caused by rubbing (friction) or pressure, such as from shoes that are too tight or do not fit properly. Treatment may include wearing padding, protective   layers, gloves, or orthotics as told by your health care provider. This information is not intended to replace advice given to you by your health care provider. Make sure you discuss any questions you have with your health care provider. Document Revised: 11/06/2019 Document Reviewed: 11/06/2019 [employer] Patient Education  2022 [employer]  

## 2021-11-07 ENCOUNTER — Other Ambulatory Visit: Payer: Self-pay | Admitting: Family Medicine

## 2021-11-07 ENCOUNTER — Ambulatory Visit
Admission: RE | Admit: 2021-11-07 | Discharge: 2021-11-07 | Disposition: A | Discharge status: Home / Self Care | Payer: Medicare PPO | Source: Ambulatory Visit | Attending: Family Medicine | Admitting: Family Medicine

## 2021-11-07 DIAGNOSIS — M545 Low back pain, unspecified: Secondary | ICD-10-CM

## 2021-11-07 DIAGNOSIS — S39012A Strain of muscle, fascia and tendon of lower back, initial encounter: Secondary | ICD-10-CM | POA: Diagnosis not present

## 2021-11-07 DIAGNOSIS — S3992XA Unspecified injury of lower back, initial encounter: Secondary | ICD-10-CM | POA: Diagnosis not present

## 2022-01-25 DIAGNOSIS — Z125 Encounter for screening for malignant neoplasm of prostate: Secondary | ICD-10-CM | POA: Diagnosis not present

## 2022-01-25 DIAGNOSIS — N401 Enlarged prostate with lower urinary tract symptoms: Secondary | ICD-10-CM | POA: Diagnosis not present

## 2022-01-25 DIAGNOSIS — R3916 Straining to void: Secondary | ICD-10-CM | POA: Diagnosis not present

## 2022-01-25 DIAGNOSIS — N200 Calculus of kidney: Secondary | ICD-10-CM | POA: Diagnosis not present

## 2022-01-25 DIAGNOSIS — R351 Nocturia: Secondary | ICD-10-CM | POA: Diagnosis not present

## 2022-05-04 ENCOUNTER — Encounter: Payer: Self-pay | Admitting: Cardiovascular Disease

## 2022-05-04 MED ORDER — EZETIMIBE 10 MG PO TABS
10.0000 mg | ORAL_TABLET | Freq: Every day | ORAL | 0 refills | Status: DC
Start: 2022-05-04 — End: 2022-05-04

## 2022-05-04 MED ORDER — EZETIMIBE 10 MG PO TABS
10.0000 mg | ORAL_TABLET | Freq: Every day | ORAL | 0 refills | Status: DC
Start: 2022-05-04 — End: 2022-07-31

## 2022-05-04 NOTE — Addendum Note (Signed)
Addended by: Carter Kitten D on: 05/04/2022 02:01 PM   Modules accepted: Orders

## 2022-05-29 DIAGNOSIS — M461 Sacroiliitis, not elsewhere classified: Secondary | ICD-10-CM | POA: Diagnosis not present

## 2022-05-29 DIAGNOSIS — M961 Postlaminectomy syndrome, not elsewhere classified: Secondary | ICD-10-CM | POA: Diagnosis not present

## 2022-06-13 DIAGNOSIS — M461 Sacroiliitis, not elsewhere classified: Secondary | ICD-10-CM | POA: Diagnosis not present

## 2022-06-26 NOTE — Progress Notes (Signed)
Cardiology Office Note:    Date:  06/28/2022   ID:  Billy Erickson, DOB 08-30-1945, MRN [id number]  PCP:  Jarrett Soho, PA-C   [facility] Providers Cardiologist:  Kristeen Miss, MD     Referring MD: Jarrett Soho, PA-C   Chief Complaint: shortness of breath  History of Present Illness:    Billy Erickson is a pleasant 76 y.o. male with a hx of hyperlipidemia, mild aortic insufficiency, nonobstructive CAD, and aortic atherosclerosis.   Referred to cardiology and seen by Dr. Elease Hashimoto during a telemedicine visit April 2020 for evaluation of shortness of breath.  He was started on an albuterol inhaler and scheduled for an echocardiogram and stress test.  Echocardiogram 12/26/2018 revealed normal left ventricular function, mild aortic insufficiency.  Coronary CTA revealed coronary calcium score of 168 Agatston units (50th % age/sex), mild mixed plaque in LAD, LCx mild mixed plaque with no significant stenosis, RCA no stenosis.    Last cardiology clinic visit was 06/28/2021 with Dr. Elease Hashimoto. He continued to have shortness of breath. Reported lots of neuropathic pain.  He was advised to increase rosuvastatin for LDL goal of < 70.  Today, he is here for one year follow-up. Reports he has episodes of sometimes severe shortness of breath that require him to lie down. Episodes occur frequently, but not daily. They occur at various times of the day and do not consistently worsen with exertion. Recalls a recent event when symptoms occurred when going up the stairs. Had to lie down once he got to the top and after a few minutes symptoms resolved. When episodes occur, symptoms generally resolve after laying down for 5 minutes or so. No syncope has occurred. Exercises a few days per week, walks the dog, gardens. When exercising on the elliptical or treadmill, feels that since this is a gradual increase in HR, symptoms do not occur. Episodes seem to occur more frequently with a sudden increase in  heart rate and may initially be associated with more fatigue than shortness of breath. He gets lightheaded and clammy, no n/v, no palpitations. Does not check HR/BP at home. Has chronic back pain. BP has been high at other providers' appointments. Admits that hydration is poor. No chest pain, orthopnea, edema, or PND. He is not on anti-hypertensive therapy.   Past Medical History:  Diagnosis Date   Aortic regurgitation    mild to moderate AR 05/29/16 echo [facility])   Arthritis    Dyspnea    with exercise only   Enlarged prostate    GERD (gastroesophageal reflux disease)    History of kidney stones    Hyperlipidemia    Kidney stones    Liver cyst    Wrist pain, right     Past Surgical History:  Procedure Laterality Date   ANTERIOR CRUCIATE LIGAMENT REPAIR     ANTERIOR LAT LUMBAR FUSION Right 01/08/2018   Procedure: ANTERIOR LATERAL INTERBODY FUSION LUMBAR THREE- LUMBAR FOUR, LUMBAR FOUR- LUMBAR FIVE;  Surgeon: Lisbeth Renshaw, MD;  Location: [facility] OR;  Service: Neurosurgery;  Laterality: Right;  LUMBAR INTERBODY FUSION   APPLICATION OF ROBOTIC ASSISTANCE FOR SPINAL PROCEDURE N/A 01/08/2018   Procedure: APPLICATION OF ROBOTIC ASSISTANCE FOR SPINAL PROCEDURE;  Surgeon: Lisbeth Renshaw, MD;  Location: [facility] OR;  Service: Neurosurgery;  Laterality: N/A;  APPLICATION OF ROBOTIC ASSISTANCE FOR SPINAL PROCEDURE   CYSTOSCOPY/RETROGRADE/URETEROSCOPY  02/13/2008   basketing of fragments   EXTRACORPOREAL SHOCK WAVE LITHOTRIPSY Left 03/19/2017   Procedure: LEFT EXTRACORPOREAL SHOCK WAVE LITHOTRIPSY (ESWL);  Surgeon: Crist Fat, MD;  Location: [facility];  Service: Urology;  Laterality: Left;   KNEE ARTHROSCOPY WITH MEDIAL MENISECTOMY Left 08/18/2016   Procedure: LEFT KNEE ARTHROSCOPY, CHONDROPLASTY AND PARTIAL MEDIAL AND LATERAL MENISCECTOMY;  Surgeon: Sheral Apley, MD;  Location: [facility];  Service: Orthopedics;  Laterality: Left;   KNEE SURGERY Right    LUMBAR  PERCUTANEOUS PEDICLE SCREW 3 LEVEL N/A 01/08/2018   Procedure: LUMBAR PERCUTANEOUS PEDICLE SCREW PLACEMENT LUMBAR THREE-FOUR, LUMBAR FOUR-FIVE, LUMBAR FIVE-SACRAL ONE;  Surgeon: Lisbeth Renshaw, MD;  Location: [facility];  Service: Neurosurgery;  Laterality: N/A;   MRI     nuclear stress test     TONSILLECTOMY     TRANSFORAMINAL LUMBAR INTERBODY FUSION (TLIF) WITH PEDICLE SCREW FIXATION 1 LEVEL N/A 01/08/2018   Procedure: TRANSFORAMINAL LUMBAR INTERBODY FUSION LUMBAR FIVE-SACRAL ONE;  Surgeon: Lisbeth Renshaw, MD;  Location: [facility];  Service: Neurosurgery;  Laterality: N/A;   WRIST ARTHROSCOPY WITH ULNA SHORTENING Right 07/06/2015   Procedure: RIGHT WRIST WITH  ARTHROSCOPIC DEBRIDEMENT AND ULNAR SHORTENING OSTEOTOMY;  Surgeon: Mack Hook, MD;  Location: [facility];  Service: Orthopedics;  Laterality: Right;    Current Medications: Current Meds  Medication Sig   albuterol (VENTOLIN HFA) 108 (90 Base) MCG/ACT inhaler Inhale 2 puffs into the lungs every 6 (six) hours as needed for wheezing or shortness of breath.   aspirin EC 81 MG tablet Take 1 tablet (81 mg total) by mouth daily.   ezetimibe (ZETIA) 10 MG tablet Take 1 tablet (10 mg total) by mouth daily.   gabapentin (NEURONTIN) 300 MG capsule Take 300 mg by mouth at bedtime.    Glucosamine-Chondroit-Vit C-Mn (GLUCOSAMINE 1500 COMPLEX PO) Take 1 tablet by mouth daily.   ibuprofen (ADVIL,MOTRIN) 100 MG tablet Take 200 mg by mouth every 6 (six) hours as needed for pain or fever.   methocarbamol (ROBAXIN) 500 MG tablet Take 1 tablet (500 mg total) by mouth every 6 (six) hours as needed for muscle spasms.   Multiple Vitamin (MULTIVITAMIN WITH MINERALS) TABS tablet Take 1 tablet by mouth daily.   omeprazole (PRILOSEC) 20 MG capsule Take 20 mg by mouth daily.   rosuvastatin (CRESTOR) 40 MG tablet Take 1 tablet (40 mg total) by mouth daily.   sildenafil (REVATIO) 20 MG tablet Take 20 mg by mouth 3 (three) times daily.   tamsulosin  (FLOMAX) 0.4 MG CAPS capsule Take 0.4 mg by mouth daily.   zolpidem (AMBIEN CR) 6.25 MG CR tablet Take 6.25 mg by mouth at bedtime as needed for sleep.      Allergies:   Diphenhydramine hcl and Sudafed [pseudoephedrine]   Social History   Socioeconomic History   Marital status: Married    Spouse name: Not on file   Number of children: Not on file   Years of education: Not on file   Highest education level: Not on file  Occupational History   Not on file  Tobacco Use   Smoking status: Never   Smokeless tobacco: Never  Vaping Use   Vaping Use: Never used  Substance and Sexual Activity   Alcohol use: Yes    Alcohol/week: 6.0 standard drinks of alcohol    Types: 6 Glasses of wine per week    Comment: social   Drug use: No   Sexual activity: Not on file  Other Topics Concern   Not on file  Social History Narrative   Not on file   Social Determinants of Health   Financial Resource  Strain: Not on file  Food Insecurity: Not on file  Transportation Needs: Not on file  Physical Activity: Not on file  Stress: Not on file  Social Connections: Not on file     Family History: The patient's family history includes Alzheimer's disease in his mother.  ROS:   Please see the history of present illness.    + DOE + fatigue  All other systems reviewed and are negative.  Labs/Other Studies Reviewed:    The following studies were reviewed today:  Echo 12/26/18 1. The left ventricle has normal systolic function with an ejection  fraction of 60-65%. The cavity size was normal. Left ventricular diastolic  parameters were normal.   2. The right ventricle has normal systolic function. The cavity was  normal. There is no increase in right ventricular wall thickness. Right  ventricular systolic pressure could not be assessed.   3. No evidence of mitral valve stenosis.   4. The aortic valve is tricuspid. Aortic valve regurgitation is mild by  color flow Doppler. No stenosis of the  aortic valve.   5. The aortic root, ascending aorta and aortic arch are normal in size  and structure.   CCTA 12/15/18 1. Coronary artery calcium score 168 Agatston units. This places the patient in the 50th percentile for age and gender, suggesting intermediate risk for future cardiac events.   2.  Nonobstructive CAD.  Recent Labs: 06/27/2022: ALT 36; BUN 21; Creatinine, Ser 1.29; Magnesium 2.2; Potassium 4.4; Sodium 141; TSH 1.750  Recent Lipid Panel    Component Value Date/Time   CHOL 156 09/26/2021 0807   TRIG 100 09/26/2021 0807   HDL 51 09/26/2021 0807   CHOLHDL 3.1 09/26/2021 0807   LDLCALC 87 09/26/2021 0807     Risk Assessment/Calculations:           Physical Exam:    VS:  BP 120/78 (BP Location: Left Arm, Patient Position: Sitting, Cuff Size: Large)   Pulse 71   Ht 5\' 10"  (1.778 m)   Wt 201 lb (91.2 kg)   SpO2 96%   BMI 28.84 kg/m     Wt Readings from Last 3 Encounters:  06/27/22 201 lb (91.2 kg)  06/28/21 195 lb 12.8 oz (88.8 kg)  03/31/20 199 lb (90.3 kg)     GEN:  Well nourished, well developed in no acute distress HEENT: Normal NECK: No JVD; No carotid bruits CARDIAC: RRR, no murmurs, rubs, gallops RESPIRATORY:  Clear to auscultation without rales, wheezing or rhonchi  ABDOMEN: Soft, non-tender, non-distended MUSCULOSKELETAL:  No edema; No deformity. 2+ pedal pulses, equal bilaterally SKIN: Warm and dry NEUROLOGIC:  Alert and oriented x 3 PSYCHIATRIC:  Normal affect   EKG:  EKG is ordered today.  The ekg ordered today demonstrates normal sinus rhythm at 71 bpm, no ST abnormality    Diagnoses:    1. Near syncope   2. Other fatigue   3. Nonrheumatic aortic valve insufficiency   4. Coronary artery calcification   5. Hyperlipidemia LDL goal <70   6. Coronary artery disease involving native coronary artery of native heart without angina pectoris    Assessment and Plan:     Near syncope: Frequent episodes of fatigue, DOE, that require him  to lay down to recover  occurring at various times with various levels of activity. No syncope, palpitations, or chest pain. Feels that symptoms worsen with sudden acceleration in HR. We will get a 7 day Zio monitor for evaluation of arrhythmia. Will get echo  to evaluate heart and valve function. We will check TSH, complete metabolic panel, and magnesium today to ensure abnormalities are not contributing to symptoms.   CAD without angina: Mild nonobstructive CAD on coronary CTA 12/15/2018. He denies chest pain. Having symptoms of increased DOE and fatigue.  Symptoms are somewhat atypical for angina and that they do not worsen with exertion. Will get Zio monitor and echo for evaluation. Consider repeat CCTA or stress test if monitor and echo are unremarkable.   DOE: LVEF 60-65%, normal diastolic parameters, normal RV function, mild AI on echo 12/2018. As noted above, recent increase in DOE accompanied by fatigue with various levels of activity. No shortness of breath with exertion on a consistent basis.  No edema, orthopnea, PND.  No evidence of volume overload on exam.  Aortic valve insufficiency: Mild aortic valve regurgitation on echo 12/2018.  I do not appreciate a significant murmur on exam. Does not have a wide pulse pressure. Will evaluate valve function on echo.   Hyperlipidemia LDL goal < 70: LDL 87 on 09/26/2021. Will discuss further measures to get to goal at next office visit. Continue rosuvastatin and ezetimibe.      Disposition: 3 months with me  Medication Adjustments/Labs and Tests Ordered: Current medicines are reviewed at length with the patient today.  Concerns regarding medicines are outlined above.  Orders Placed This Encounter  Procedures   TSH   Comprehensive metabolic panel   Magnesium   LONG TERM MONITOR (3-14 DAYS)   EKG 12-Lead   ECHOCARDIOGRAM COMPLETE   No orders of the defined types were placed in this encounter.   Patient Instructions  Medication Instructions:   Your physician recommends that you continue on your current medications as directed. Please refer to the Current Medication list given to you today.  *If you need a refill on your cardiac medications before your next appointment, please call your pharmacy*   Lab Work: Tsh, Cmp, Magnesium - Today   If you have labs (blood work) drawn today and your tests are completely normal, you will receive your results only by: MyChart Message (if you have MyChart) OR A paper copy in the mail If you have any lab test that is abnormal or we need to change your treatment, we will call you to review the results.   Testing/Procedures: Your physician has requested that you have an echocardiogram. Echocardiography is a painless test that uses sound waves to create images of your heart. It provides your doctor with information about the size and shape of your heart and how well your heart's chambers and valves are working. This procedure takes approximately one hour. There are no restrictions for this procedure. Please do NOT wear cologne, perfume, aftershave, or lotions (deodorant is allowed). Please arrive 15 minutes prior to your appointment time.   A zio monitor was ordered today. It will remain on for 7 days. You will then return monitor and event diary in provided box. It takes 1-2 weeks for report to be downloaded and returned to [place]. We will call you with the results. If monitor falls off or has orange flashing light, please call Zio for further instructions.     Follow-Up: At [facility], you and your health needs are our priority.  As part of our continuing mission to provide you with exceptional heart care, we have created designated Provider Care Teams.  These Care Teams include your primary Cardiologist (physician) and Advanced Practice Providers (APPs -  Physician Assistants and Nurse Practitioners)  who all work together to provide you with the care you need, when you need it.  We  recommend signing up for the patient portal called "MyChart".  Sign up information is provided on this After Visit Summary.  MyChart is used to connect with patients for Virtual Visits (Telemedicine).  Patients are able to view lab/test results, encounter notes, upcoming appointments, etc.  Non-urgent messages can be sent to your provider as well.   To learn more about what you can do with MyChart, go to [email or web address].    Your next appointment:   3 month(s)  The format for your next appointment:   In Person  Provider:   Eligha Bridegroom, NP    Other Instructions ZIO XT- Long Term Monitor Instructions  Your physician has requested you wear a ZIO patch monitor for 14 days.  This is a single patch monitor. [employer] supplies one patch monitor per enrollment. Additional stickers are not available. Please do not apply patch if you will be having a Nuclear Stress Test,  Echocardiogram, Cardiac CT, MRI, or Chest Xray during the period you would be wearing the  monitor. The patch cannot be worn during these tests. You cannot remove and re-apply the  ZIO XT patch monitor.  Your ZIO patch monitor will be mailed 3 day USPS to your address on file. It may take 3-5 days  to receive your monitor after you have been enrolled.  Once you have received your monitor, please review the enclosed instructions. Your monitor  has already been registered assigning a specific monitor serial # to you.  Billing and Patient Assistance Program Information  We have supplied [employer] with any of your insurance information on file for billing purposes. [employer] offers a sliding scale Patient Assistance Program for patients that do not have  insurance, or whose insurance does not completely cover the cost of the ZIO monitor.  You must apply for the Patient Assistance Program to qualify for this discounted rate.  To apply, please call [employer] at [phone], select option 4, select option 2, ask to apply for   Patient Assistance Program. Meredeth Ide will ask your household income, and how many people  are in your household. They will quote your out-of-pocket cost based on that information.  [employer] will also be able to set up a 48-month, interest-free payment plan if needed.  Applying the monitor   Shave hair from upper left chest.  Hold abrader disc by orange tab. Rub abrader in 40 strokes over the upper left chest as  indicated in your monitor instructions.  Clean area with 4 enclosed alcohol pads. Let dry.  Apply patch as indicated in monitor instructions. Patch will be placed under collarbone on left  side of chest with arrow pointing upward.  Rub patch adhesive wings for 2 minutes. Remove white label marked "1". Remove the white  label marked "2". Rub patch adhesive wings for 2 additional minutes.  While looking in a mirror, press and release button in center of patch. A small green light will  flash 3-4 times. This will be your only indicator that the monitor has been turned on.  Do not shower for the first 24 hours. You may shower after the first 24 hours.  Press the button if you feel a symptom. You will hear a small click. Record Date, Time and  Symptom in the Patient Logbook.  When you are ready to remove the patch, follow instructions on the last 2 pages of Patient  Logbook. Stick patch  monitor onto the last page of Patient Logbook.  Place Patient Logbook in the blue and white box. Use locking tab on box and tape box closed  securely. The blue and white box has prepaid postage on it. Please place it in the mailbox as  soon as possible. Your physician should have your test results approximately 7 days after the  monitor has been mailed back to [facility].  Call [facility] Customer Care at [phone] if you have questions regarding  your ZIO XT patch monitor. Call them immediately if you see an orange light blinking on your  monitor.  If your monitor falls off in less than 4  days, contact our Monitor department at [phone].  If your monitor becomes loose or falls off after 4 days call [employer] at [phone] for  suggestions on securing your monitor           Signed, Levi Aland, NP  06/28/2022 6:37 AM    [facility]

## 2022-06-27 ENCOUNTER — Encounter: Payer: Self-pay | Admitting: Nurse Practitioner

## 2022-06-27 ENCOUNTER — Ambulatory Visit: Payer: Medicare PPO | Attending: Nurse Practitioner | Admitting: Nurse Practitioner

## 2022-06-27 ENCOUNTER — Ambulatory Visit: Payer: Medicare PPO | Attending: Nurse Practitioner

## 2022-06-27 VITALS — BP 120/78 | HR 71 | Ht 70.0 in | Wt 201.0 lb

## 2022-06-27 DIAGNOSIS — R55 Syncope and collapse: Secondary | ICD-10-CM

## 2022-06-27 DIAGNOSIS — R5383 Other fatigue: Secondary | ICD-10-CM | POA: Diagnosis not present

## 2022-06-27 DIAGNOSIS — I2510 Atherosclerotic heart disease of native coronary artery without angina pectoris: Secondary | ICD-10-CM

## 2022-06-27 DIAGNOSIS — I2584 Coronary atherosclerosis due to calcified coronary lesion: Secondary | ICD-10-CM | POA: Diagnosis not present

## 2022-06-27 DIAGNOSIS — E785 Hyperlipidemia, unspecified: Secondary | ICD-10-CM | POA: Diagnosis not present

## 2022-06-27 DIAGNOSIS — I351 Nonrheumatic aortic (valve) insufficiency: Secondary | ICD-10-CM | POA: Diagnosis not present

## 2022-06-27 NOTE — Patient Instructions (Signed)
Medication Instructions:  Your physician recommends that you continue on your current medications as directed. Please refer to the Current Medication list given to you today.  *If you need a refill on your cardiac medications before your next appointment, please call your pharmacy*   Lab Work: Tsh, Cmp, Magnesium - Today   If you have labs (blood work) drawn today and your tests are completely normal, you will receive your results only by: MyChart Message (if you have MyChart) OR A paper copy in the mail If you have any lab test that is abnormal or we need to change your treatment, we will call you to review the results.   Testing/Procedures: Your physician has requested that you have an echocardiogram. Echocardiography is a painless test that uses sound waves to create images of your heart. It provides your doctor with information about the size and shape of your heart and how well your heart's chambers and valves are working. This procedure takes approximately one hour. There are no restrictions for this procedure. Please do NOT wear cologne, perfume, aftershave, or lotions (deodorant is allowed). Please arrive 15 minutes prior to your appointment time.   A zio monitor was ordered today. It will remain on for 7 days. You will then return monitor and event diary in provided box. It takes 1-2 weeks for report to be downloaded and returned to [place]. We will call you with the results. If monitor falls off or has orange flashing light, please call Zio for further instructions.     Follow-Up: At [facility], you and your health needs are our priority.  As part of our continuing mission to provide you with exceptional heart care, we have created designated Provider Care Teams.  These Care Teams include your primary Cardiologist (physician) and Advanced Practice Providers (APPs -  Physician Assistants and Nurse Practitioners) who all work together to provide you with the care you need,  when you need it.  We recommend signing up for the patient portal called "MyChart".  Sign up information is provided on this After Visit Summary.  MyChart is used to connect with patients for Virtual Visits (Telemedicine).  Patients are able to view lab/test results, encounter notes, upcoming appointments, etc.  Non-urgent messages can be sent to your provider as well.   To learn more about what you can do with MyChart, go to [email or web address].    Your next appointment:   3 month(s)  The format for your next appointment:   In Person  Provider:   Eligha Bridegroom, NP    Other Instructions ZIO XT- Long Term Monitor Instructions  Your physician has requested you wear a ZIO patch monitor for 14 days.  This is a single patch monitor. [employer] supplies one patch monitor per enrollment. Additional stickers are not available. Please do not apply patch if you will be having a Nuclear Stress Test,  Echocardiogram, Cardiac CT, MRI, or Chest Xray during the period you would be wearing the  monitor. The patch cannot be worn during these tests. You cannot remove and re-apply the  ZIO XT patch monitor.  Your ZIO patch monitor will be mailed 3 day USPS to your address on file. It may take 3-5 days  to receive your monitor after you have been enrolled.  Once you have received your monitor, please review the enclosed instructions. Your monitor  has already been registered assigning a specific monitor serial # to you.  Billing and Patient Assistance Program Information  We have supplied  [employer] with any of your insurance information on file for billing purposes. [employer] offers a sliding scale Patient Assistance Program for patients that do not have  insurance, or whose insurance does not completely cover the cost of the ZIO monitor.  You must apply for the Patient Assistance Program to qualify for this discounted rate.  To apply, please call [employer] at [phone], select option 4, select option  2, ask to apply for  Patient Assistance Program. Meredeth Ide will ask your household income, and how many people  are in your household. They will quote your out-of-pocket cost based on that information.  [employer] will also be able to set up a 35-month, interest-free payment plan if needed.  Applying the monitor   Shave hair from upper left chest.  Hold abrader disc by orange tab. Rub abrader in 40 strokes over the upper left chest as  indicated in your monitor instructions.  Clean area with 4 enclosed alcohol pads. Let dry.  Apply patch as indicated in monitor instructions. Patch will be placed under collarbone on left  side of chest with arrow pointing upward.  Rub patch adhesive wings for 2 minutes. Remove white label marked "1". Remove the white  label marked "2". Rub patch adhesive wings for 2 additional minutes.  While looking in a mirror, press and release button in center of patch. A small green light will  flash 3-4 times. This will be your only indicator that the monitor has been turned on.  Do not shower for the first 24 hours. You may shower after the first 24 hours.  Press the button if you feel a symptom. You will hear a small click. Record Date, Time and  Symptom in the Patient Logbook.  When you are ready to remove the patch, follow instructions on the last 2 pages of Patient  Logbook. Stick patch monitor onto the last page of Patient Logbook.  Place Patient Logbook in the blue and white box. Use locking tab on box and tape box closed  securely. The blue and white box has prepaid postage on it. Please place it in the mailbox as  soon as possible. Your physician should have your test results approximately 7 days after the  monitor has been mailed back to [facility].  Call [facility] Customer Care at [phone] if you have questions regarding  your ZIO XT patch monitor. Call them immediately if you see an orange light blinking on your  monitor.  If your monitor falls  off in less than 4 days, contact our Monitor department at [phone].  If your monitor becomes loose or falls off after 4 days call [employer] at [phone] for  suggestions on securing your monitor

## 2022-06-27 NOTE — Progress Notes (Unsigned)
Enrolled patient for a 14 day Zio XT monitor to be mailed to patients home  Dr Nahser to read 

## 2022-06-28 ENCOUNTER — Encounter: Payer: Self-pay | Admitting: Nurse Practitioner

## 2022-06-28 LAB — COMPREHENSIVE METABOLIC PANEL
ALT: 36 IU/L (ref 0–44)
AST: 39 IU/L (ref 0–40)
Albumin/Globulin Ratio: 2 (ref 1.2–2.2)
Albumin: 4.7 g/dL (ref 3.8–4.8)
Alkaline Phosphatase: 70 IU/L (ref 44–121)
BUN/Creatinine Ratio: 16 (ref 10–24)
BUN: 21 mg/dL (ref 8–27)
Bilirubin Total: 0.5 mg/dL (ref 0.0–1.2)
CO2: 22 mmol/L (ref 20–29)
Calcium: 10 mg/dL (ref 8.6–10.2)
Chloride: 105 mmol/L (ref 96–106)
Creatinine, Ser: 1.29 mg/dL — ABNORMAL HIGH (ref 0.76–1.27)
Globulin, Total: 2.4 g/dL (ref 1.5–4.5)
Glucose: 109 mg/dL — ABNORMAL HIGH (ref 70–99)
Potassium: 4.4 mmol/L (ref 3.5–5.2)
Sodium: 141 mmol/L (ref 134–144)
Total Protein: 7.1 g/dL (ref 6.0–8.5)
eGFR: 58 mL/min/{1.73_m2} — ABNORMAL LOW (ref >59)

## 2022-06-28 LAB — TSH: TSH: 1.75 u[IU]/mL (ref 0.450–4.500)

## 2022-06-28 LAB — MAGNESIUM: Magnesium: 2.2 mg/dL (ref 1.6–2.3)

## 2022-06-29 DIAGNOSIS — R55 Syncope and collapse: Secondary | ICD-10-CM

## 2022-07-05 ENCOUNTER — Other Ambulatory Visit: Payer: Self-pay | Admitting: Cardiovascular Disease

## 2022-07-05 DIAGNOSIS — M461 Sacroiliitis, not elsewhere classified: Secondary | ICD-10-CM | POA: Diagnosis not present

## 2022-07-05 DIAGNOSIS — M4714 Other spondylosis with myelopathy, thoracic region: Secondary | ICD-10-CM | POA: Diagnosis not present

## 2022-07-10 ENCOUNTER — Other Ambulatory Visit: Payer: Self-pay | Admitting: Physical Medicine and Rehabilitation

## 2022-07-10 DIAGNOSIS — M4714 Other spondylosis with myelopathy, thoracic region: Secondary | ICD-10-CM

## 2022-07-12 DIAGNOSIS — M898X1 Other specified disorders of bone, shoulder: Secondary | ICD-10-CM | POA: Diagnosis not present

## 2022-07-13 DIAGNOSIS — R55 Syncope and collapse: Secondary | ICD-10-CM | POA: Diagnosis not present

## 2022-07-21 ENCOUNTER — Ambulatory Visit (HOSPITAL_COMMUNITY): Payer: Medicare PPO | Attending: Nurse Practitioner

## 2022-07-21 DIAGNOSIS — I351 Nonrheumatic aortic (valve) insufficiency: Secondary | ICD-10-CM | POA: Insufficient documentation

## 2022-07-21 LAB — ECHOCARDIOGRAM COMPLETE
Area-P 1/2: 2.6 cm2
P 1/2 time: 665 msec
S' Lateral: 2.8 cm

## 2022-07-29 ENCOUNTER — Ambulatory Visit
Admission: RE | Admit: 2022-07-29 | Discharge: 2022-07-29 | Disposition: A | Discharge status: Home / Self Care | Payer: Medicare PPO | Source: Ambulatory Visit | Attending: Physical Medicine and Rehabilitation | Admitting: Physical Medicine and Rehabilitation

## 2022-07-29 DIAGNOSIS — M4714 Other spondylosis with myelopathy, thoracic region: Secondary | ICD-10-CM

## 2022-07-29 DIAGNOSIS — M546 Pain in thoracic spine: Secondary | ICD-10-CM | POA: Diagnosis not present

## 2022-07-31 ENCOUNTER — Other Ambulatory Visit: Payer: Self-pay | Admitting: Family Medicine

## 2022-07-31 ENCOUNTER — Other Ambulatory Visit: Payer: Self-pay | Admitting: Cardiovascular Disease

## 2022-07-31 ENCOUNTER — Ambulatory Visit
Admission: RE | Admit: 2022-07-31 | Discharge: 2022-07-31 | Disposition: A | Discharge status: Home / Self Care | Payer: Medicare PPO | Source: Ambulatory Visit | Attending: Family Medicine | Admitting: Family Medicine

## 2022-07-31 DIAGNOSIS — R0602 Shortness of breath: Secondary | ICD-10-CM

## 2022-07-31 DIAGNOSIS — R0600 Dyspnea, unspecified: Secondary | ICD-10-CM | POA: Diagnosis not present

## 2022-07-31 DIAGNOSIS — Z6829 Body mass index (BMI) 29.0-29.9, adult: Secondary | ICD-10-CM | POA: Diagnosis not present

## 2022-09-01 ENCOUNTER — Ambulatory Visit: Payer: Medicare PPO | Admitting: Pulmonary Disease

## 2022-09-01 ENCOUNTER — Encounter: Payer: Self-pay | Admitting: Pulmonary Disease

## 2022-09-01 VITALS — BP 118/72 | HR 91 | Ht 70.0 in | Wt 203.2 lb

## 2022-09-01 DIAGNOSIS — R0602 Shortness of breath: Secondary | ICD-10-CM | POA: Diagnosis not present

## 2022-09-01 MED ORDER — SPIRIVA RESPIMAT 2.5 MCG/ACT IN AERS: 2.0000 | INHALATION_SPRAY | Freq: Every day | RESPIRATORY_TRACT | 0 refills | Status: DC

## 2022-09-01 NOTE — Patient Instructions (Addendum)
Try spiriva 2.59mg 2 puffs daily - use over the next month and monitor for any changes in your shortness of breath  Continue to use albuterol as needed  We will schedule you for complete pulmonary function tests at earliest possible date  Follow up in 6-8 weeks

## 2022-09-01 NOTE — Progress Notes (Unsigned)
Synopsis: Referred in February 2024 for shortness of breath by Marda Stalker, PA  Subjective:   PATIENT ID: Billy Erickson GENDER: male DOB: 1946-04-06, MRN: [id number]   HPI  Chief Complaint  Patient presents with   Consult    Referred by PCP for DOE. States this has been going on since April 2020. Denies any increased coughing spells or wheezing.    Billy Erickson is a 78 year old male, never smoker with history of GERD, HLD and mild aortic regurgitation who is referred to pulmonary clinic for shortness of breath.   He reports having dyspnea since 2020. He notices the shortness of breath with going up stairs, bending over, or doing heavy exertional work in his garden. He denies wheezing or cough. He has been using albuterol inhaler as needed for the dyspnea with out relief. He denies any night time awakenings due to shortness of breath.   He was seen by cardiology 06/27/22. Echo showed hypderdynamic LV with EF 70-75%. He had extended heart monitoring performed with no significant arrhythmias noted. CT Coronary Scan 2020 showed non obstructive CAD with coronary calcium score of 168, placing him in the 50th percentile for his age/gender.  He is a retired Automotive engineer. He was active during his career as he commuted from [place] to [place] via bike. He continues to exercise most days of the week. Denies any limitations with his work outs. He was a Brewing technologist for 4-5 years. No dust or chemical exposures over the years. He is working on his family genealogy. He has 2 grandchildren. His father died of obstructive lung disease.   Past Medical History:  Diagnosis Date   Aortic regurgitation    mild to moderate AR 05/29/16 echo [facility])   Arthritis    Dyspnea    with exercise only   Enlarged prostate    GERD (gastroesophageal reflux disease)    History of kidney stones    Hyperlipidemia    Kidney stones    Liver cyst    Wrist pain, right      Family  History  Problem Relation Age of Onset   Alzheimer's disease Mother      Social History   Socioeconomic History   Marital status: Married    Spouse name: Not on file   Number of children: Not on file   Years of education: Not on file   Highest education level: Not on file  Occupational History   Not on file  Tobacco Use   Smoking status: Never   Smokeless tobacco: Never  Vaping Use   Vaping Use: Never used  Substance and Sexual Activity   Alcohol use: Yes    Alcohol/week: 6.0 standard drinks of alcohol    Types: 6 Glasses of wine per week    Comment: social   Drug use: No   Sexual activity: Not on file  Other Topics Concern   Not on file  Social History Narrative   Not on file   Social Determinants of Health   Financial Resource Strain: Not on file  Food Insecurity: Not on file  Transportation Needs: Not on file  Physical Activity: Not on file  Stress: Not on file  Social Connections: Not on file  Intimate Partner Violence: Not on file     Allergies  Allergen Reactions   Diphenhydramine Hcl     Other reaction(s): Other (See Comments)   Sudafed [Pseudoephedrine] Anxiety and Other (See Comments)    Anxious, unable to urinate,"jittery"  Outpatient Medications Prior to Visit  Medication Sig Dispense Refill   albuterol (VENTOLIN HFA) 108 (90 Base) MCG/ACT inhaler Inhale 2 puffs into the lungs every 6 (six) hours as needed for wheezing or shortness of breath.     aspirin EC 81 MG tablet Take 1 tablet (81 mg total) by mouth daily. 30 tablet 0   ezetimibe (ZETIA) 10 MG tablet TAKE 1 TABLET BY MOUTH EVERY DAY 90 tablet 0   gabapentin (NEURONTIN) 300 MG capsule Take 300 mg by mouth at bedtime.   1   Glucosamine-Chondroit-Vit C-Mn (GLUCOSAMINE 1500 COMPLEX PO) Take 1 tablet by mouth daily.     ibuprofen (ADVIL,MOTRIN) 100 MG tablet Take 200 mg by mouth every 6 (six) hours as needed for pain or fever.     methocarbamol (ROBAXIN) 500 MG tablet Take 1 tablet (500 mg  total) by mouth every 6 (six) hours as needed for muscle spasms. 90 tablet 2   Multiple Vitamin (MULTIVITAMIN WITH MINERALS) TABS tablet Take 1 tablet by mouth daily.     omeprazole (PRILOSEC) 20 MG capsule Take 20 mg by mouth daily.     rosuvastatin (CRESTOR) 40 MG tablet TAKE 1 TABLET BY MOUTH EVERY DAY 90 tablet 3   sildenafil (REVATIO) 20 MG tablet Take 20 mg by mouth 3 (three) times daily.     tamsulosin (FLOMAX) 0.4 MG CAPS capsule Take 0.4 mg by mouth daily.  3   zolpidem (AMBIEN CR) 6.25 MG CR tablet Take 6.25 mg by mouth at bedtime as needed for sleep.   0   No facility-administered medications prior to visit.    Review of Systems  Constitutional:  Negative for chills, fever, malaise/fatigue and weight loss.  HENT:  Negative for congestion, sinus pain and sore throat.   Eyes: Negative.   Respiratory:  Positive for shortness of breath. Negative for cough, hemoptysis, sputum production and wheezing.   Cardiovascular:  Negative for chest pain, palpitations, orthopnea, claudication and leg swelling.  Gastrointestinal:  Negative for abdominal pain, heartburn, nausea and vomiting.  Genitourinary: Negative.   Musculoskeletal:  Negative for joint pain and myalgias.  Skin:  Negative for rash.  Neurological:  Negative for weakness.  Endo/Heme/Allergies: Negative.   Psychiatric/Behavioral: Negative.     Objective:   Vitals:   09/01/22 1300  BP: 118/72  Pulse: 91  SpO2: 96%  Weight: 203 lb 3.2 oz (92.2 kg)  Height: 5' 10"$  (1.778 m)   Physical Exam Constitutional:      General: He is not in acute distress. HENT:     Head: Normocephalic and atraumatic.  Eyes:     Extraocular Movements: Extraocular movements intact.     Conjunctiva/sclera: Conjunctivae normal.     Pupils: Pupils are equal, round, and reactive to light.  Cardiovascular:     Rate and Rhythm: Normal rate and regular rhythm.     Pulses: Normal pulses.     Heart sounds: Normal heart sounds. No murmur  heard. Abdominal:     General: Bowel sounds are normal.     Palpations: Abdomen is soft.  Musculoskeletal:     Right lower leg: No edema.     Left lower leg: No edema.  Lymphadenopathy:     Cervical: No cervical adenopathy.  Skin:    General: Skin is warm and dry.  Neurological:     General: No focal deficit present.     Mental Status: He is alert.  Psychiatric:        Mood and Affect: Mood normal.  Behavior: Behavior normal.        Thought Content: Thought content normal.        Judgment: Judgment normal.     CBC    Component Value Date/Time   WBC 4.1 08/19/2020 1109   RBC 4.86 08/19/2020 1109   HGB 15.5 08/19/2020 1109   HCT 45.2 08/19/2020 1109   PLT 154 08/19/2020 1109   MCV 93.0 08/19/2020 1109   MCH 31.9 08/19/2020 1109   MCHC 34.3 08/19/2020 1109   RDW 12.2 08/19/2020 1109   LYMPHSABS 1.3 04/18/2018 1603   MONOABS 1.1 (H) 04/18/2018 1603   EOSABS 0.1 04/18/2018 1603   BASOSABS 0.0 04/18/2018 1603      Latest Ref Rng & Units 06/27/2022    4:04 PM 08/19/2020   11:09 AM 07/01/2020    7:23 AM  BMP  Glucose 70 - 99 mg/dL 109  98  96   BUN 8 - 27 mg/dL 21  18  18   $ Creatinine 0.76 - 1.27 mg/dL 1.29  1.08  1.16   BUN/Creat Ratio 10 - 24 16   16   $ Sodium 134 - 144 mmol/L 141  141  142   Potassium 3.5 - 5.2 mmol/L 4.4  4.1  4.3   Chloride 96 - 106 mmol/L 105  106  106   CO2 20 - 29 mmol/L 22  26  23   $ Calcium 8.6 - 10.2 mg/dL 10.0  9.3  9.6    Chest imaging: CXR 07/31/22 Lungs are adequately inflated and otherwise clear. Cardiomediastinal silhouette and remainder of the exam is unchanged.  PFT:     No data to display          Labs:  Path:  Echo 07/21/22: LV EF 70-75% with hyperdynamic function. Grade I diastolic dysfunction. RV systolic size and function are normal. Mild aortic valve regurgitation.  Long Term Heart Monitor - 7 days 07/13/22   Predominately normal sinus rhythm   Rare episodes of nonsustained supraventricular tachycardia.    4-6 beats.   these are not significant   No serious arrhythmias to explain his episodes of near syncope  Heart Catheterization:  Assessment & Plan:   Shortness of breath - Plan: Pulmonary Function Test  Discussion: Billy Erickson is a 77 year old male, never smoker with history of GERD, HLD and mild aortic regurgitation who is referred to pulmonary clinic for shortness of breath.   He does not have findings concerning for ILD at this time. He is low risk for obstructive lung disease. He has not benefited from albuterol use.   Will check pulmonary function testing. Will trial him on spiriva 2.92mg 2 puffs daily and monitor for any benefit.   We will consider HRCT chest scan based on PFT results, otherwise has normal CXR from 07/31/2022.   He may need cardiac stress test vs cardiopulmonary exercise test if still have not identified a reason for his dyspnea.  Follow up in 6-8 weeks.  JFreda Jackson MD LLemon Grove[facility] Care Office: [phone]   Current Outpatient Medications:    albuterol (VENTOLIN HFA) 108 (90 Base) MCG/ACT inhaler, Inhale 2 puffs into the lungs every 6 (six) hours as needed for wheezing or shortness of breath., Disp: , Rfl:    aspirin EC 81 MG tablet, Take 1 tablet (81 mg total) by mouth daily., Disp: 30 tablet, Rfl: 0   ezetimibe (ZETIA) 10 MG tablet, TAKE 1 TABLET BY MOUTH EVERY DAY, Disp: 90 tablet, Rfl: 0  gabapentin (NEURONTIN) 300 MG capsule, Take 300 mg by mouth at bedtime. , Disp: , Rfl: 1   Glucosamine-Chondroit-Vit C-Mn (GLUCOSAMINE 1500 COMPLEX PO), Take 1 tablet by mouth daily., Disp: , Rfl:    ibuprofen (ADVIL,MOTRIN) 100 MG tablet, Take 200 mg by mouth every 6 (six) hours as needed for pain or fever., Disp: , Rfl:    methocarbamol (ROBAXIN) 500 MG tablet, Take 1 tablet (500 mg total) by mouth every 6 (six) hours as needed for muscle spasms., Disp: 90 tablet, Rfl: 2   Multiple Vitamin (MULTIVITAMIN WITH MINERALS) TABS tablet, Take 1  tablet by mouth daily., Disp: , Rfl:    omeprazole (PRILOSEC) 20 MG capsule, Take 20 mg by mouth daily., Disp: , Rfl:    rosuvastatin (CRESTOR) 40 MG tablet, TAKE 1 TABLET BY MOUTH EVERY DAY, Disp: 90 tablet, Rfl: 3   sildenafil (REVATIO) 20 MG tablet, Take 20 mg by mouth 3 (three) times daily., Disp: , Rfl:    tamsulosin (FLOMAX) 0.4 MG CAPS capsule, Take 0.4 mg by mouth daily., Disp: , Rfl: 3   Tiotropium Bromide Monohydrate (SPIRIVA RESPIMAT) 2.5 MCG/ACT AERS, Inhale 2 puffs into the lungs daily., Disp: 4 g, Rfl: 0   zolpidem (AMBIEN CR) 6.25 MG CR tablet, Take 6.25 mg by mouth at bedtime as needed for sleep. , Disp: , Rfl: 0

## 2022-09-01 NOTE — Progress Notes (Unsigned)
Patient seen in the office today and instructed on use of Spiriva 2.5mcg.  Patient expressed understanding and demonstrated technique. 

## 2022-09-02 ENCOUNTER — Encounter: Payer: Self-pay | Admitting: Pulmonary Disease

## 2022-09-13 ENCOUNTER — Telehealth: Payer: Self-pay | Admitting: Pulmonary Disease

## 2022-09-13 DIAGNOSIS — J449 Chronic obstructive pulmonary disease, unspecified: Secondary | ICD-10-CM

## 2022-09-13 MED ORDER — SPIRIVA RESPIMAT 2.5 MCG/ACT IN AERS: 2.0000 | INHALATION_SPRAY | Freq: Every day | RESPIRATORY_TRACT | 5 refills | Status: DC

## 2022-09-13 NOTE — Telephone Encounter (Signed)
Spoke to pt and informed him of Spiriva sent to pharmacy. Pt verbalized understanding. Nothing further needed.

## 2022-09-13 NOTE — Addendum Note (Signed)
Addended by: Freda Jackson on: 09/13/2022 12:06 PM   Modules accepted: Orders

## 2022-09-13 NOTE — Telephone Encounter (Signed)
Prescription sent to his pharmacy.

## 2022-09-13 NOTE — Telephone Encounter (Signed)
Dr. Erin Fulling please advise if your okay with sending in Spiriva prescription. Patient states it has been working good for him

## 2022-09-13 NOTE — Telephone Encounter (Signed)
Patient states Spiriva samples has been working well and would like a prescription or more samples. Uses [facility] on [address]. Please advise.

## 2022-09-18 NOTE — Progress Notes (Deleted)
Cardiology Office Note:    Date:  09/18/2022   ID:  Billy Erickson, DOB 12-22-1945, MRN [id number]  PCP:  Marda Stalker, PA-C   [facility] Providers Cardiologist:  Mertie Moores, MD     Referring MD: Marda Stalker, PA-C   Chief Complaint: shortness of breath  History of Present Illness:    Billy Erickson is a pleasant 77 y.o. male with a hx of hyperlipidemia, mild aortic insufficiency, nonobstructive CAD, and aortic atherosclerosis.   Referred to cardiology and seen by Dr. Acie Fredrickson during a telemedicine visit April 2020 for evaluation of shortness of breath.  He was started on an albuterol inhaler and scheduled for an echocardiogram and stress test.  Echocardiogram 12/26/2018 revealed normal left ventricular function, mild aortic insufficiency.  Coronary CTA revealed coronary calcium score of 168 Agatston units (50th % age/sex), mild mixed plaque in LAD, LCx mild mixed plaque with no significant stenosis, RCA no stenosis.    Last cardiology clinic visit was 06/28/2021 with Dr. Acie Fredrickson. He continued to have shortness of breath. Reported lots of neuropathic pain.  He was advised to increase rosuvastatin for LDL goal of < 70.  Seen by me on 06/27/22 for one year follow-up. Reported episodes of sometimes severe shortness of breath that require him to lie down. Episodes occur frequently, but not daily. They occur at various times of the day and do not consistently worsen with exertion. Recalls a recent event when symptoms occurred when going up the stairs. Had to lie down once he got to the top and after a few minutes symptoms resolved. When episodes occur, symptoms generally resolve after laying down for 5 minutes or so. No syncope has occurred. Exercises a few days per week, walks the dog, gardens. When exercising on the elliptical or treadmill, feels that since this is a gradual increase in HR, symptoms do not occur. Episodes seem to occur more frequently with a sudden increase in  heart rate and may initially be associated with more fatigue than shortness of breath. He gets lightheaded and clammy, no n/v, no palpitations. Does not check HR/BP at home. Has chronic back pain. BP has been high at other providers' appointments. Admits that hydration is poor. No chest pain, orthopnea, edema, or PND. He is not on anti-hypertensive therapy. Cardiac monitor revealed predominantly NSR, rare episodes of nonsustained SVT 4-6 beats, not significant, no serious arrhythmias to explain episodes of near syncope. 2D echo revealed hyperdynamic pumping function, G1DD, mild AR, nothing to explain presyncope.   Today,   Past Medical History:  Diagnosis Date   Aortic regurgitation    mild to moderate AR 05/29/16 echo [facility])   Arthritis    Dyspnea    with exercise only   Enlarged prostate    GERD (gastroesophageal reflux disease)    History of kidney stones    Hyperlipidemia    Kidney stones    Liver cyst    Wrist pain, right     Past Surgical History:  Procedure Laterality Date   ANTERIOR CRUCIATE LIGAMENT REPAIR     ANTERIOR LAT LUMBAR FUSION Right 01/08/2018   Procedure: ANTERIOR LATERAL INTERBODY FUSION LUMBAR THREE- LUMBAR FOUR, LUMBAR FOUR- LUMBAR FIVE;  Surgeon: Consuella Lose, MD;  Location: [place];  Service: Neurosurgery;  Laterality: Right;  LUMBAR INTERBODY FUSION   APPLICATION OF ROBOTIC ASSISTANCE FOR SPINAL PROCEDURE N/A 01/08/2018   Procedure: APPLICATION OF ROBOTIC ASSISTANCE FOR SPINAL PROCEDURE;  Surgeon: Consuella Lose, MD;  Location: [place];  Service: Neurosurgery;  Laterality: N/A;  APPLICATION OF ROBOTIC ASSISTANCE FOR SPINAL PROCEDURE   CYSTOSCOPY/RETROGRADE/URETEROSCOPY  02/13/2008   basketing of fragments   EXTRACORPOREAL SHOCK WAVE LITHOTRIPSY Left 03/19/2017   Procedure: LEFT EXTRACORPOREAL SHOCK WAVE LITHOTRIPSY (ESWL);  Surgeon: Ardis Hughs, MD;  Location: [facility];  Service: Urology;  Laterality: Left;   KNEE ARTHROSCOPY WITH MEDIAL  MENISECTOMY Left 08/18/2016   Procedure: LEFT KNEE ARTHROSCOPY, CHONDROPLASTY AND PARTIAL MEDIAL AND LATERAL MENISCECTOMY;  Surgeon: Renette Butters, MD;  Location: [place];  Service: Orthopedics;  Laterality: Left;   KNEE SURGERY Right    LUMBAR PERCUTANEOUS PEDICLE SCREW 3 LEVEL N/A 01/08/2018   Procedure: LUMBAR PERCUTANEOUS PEDICLE SCREW PLACEMENT LUMBAR THREE-FOUR, LUMBAR FOUR-FIVE, LUMBAR FIVE-SACRAL ONE;  Surgeon: Consuella Lose, MD;  Location: [place];  Service: Neurosurgery;  Laterality: N/A;   MRI     nuclear stress test     TONSILLECTOMY     TRANSFORAMINAL LUMBAR INTERBODY FUSION (TLIF) WITH PEDICLE SCREW FIXATION 1 LEVEL N/A 01/08/2018   Procedure: TRANSFORAMINAL LUMBAR INTERBODY FUSION LUMBAR FIVE-SACRAL ONE;  Surgeon: Consuella Lose, MD;  Location: [place];  Service: Neurosurgery;  Laterality: N/A;   WRIST ARTHROSCOPY WITH ULNA SHORTENING Right 07/06/2015   Procedure: RIGHT WRIST WITH  ARTHROSCOPIC DEBRIDEMENT AND ULNAR SHORTENING OSTEOTOMY;  Surgeon: Milly Jakob, MD;  Location: [place];  Service: Orthopedics;  Laterality: Right;    Current Medications: No outpatient medications have been marked as taking for the 09/26/22 encounter (Appointment) with Ann Maki, Lanice Schwab, NP.     Allergies:   Diphenhydramine hcl and Sudafed [pseudoephedrine]   Social History   Socioeconomic History   Marital status: Married    Spouse name: Not on file   Number of children: Not on file   Years of education: Not on file   Highest education level: Not on file  Occupational History   Not on file  Tobacco Use   Smoking status: Never   Smokeless tobacco: Never  Vaping Use   Vaping Use: Never used  Substance and Sexual Activity   Alcohol use: Yes    Alcohol/week: 6.0 standard drinks of alcohol    Types: 6 Glasses of wine per week    Comment: social   Drug use: No   Sexual activity: Not on file  Other Topics Concern   Not on file  Social History  Narrative   Not on file   Social Determinants of Health   Financial Resource Strain: Not on file  Food Insecurity: Not on file  Transportation Needs: Not on file  Physical Activity: Not on file  Stress: Not on file  Social Connections: Not on file     Family History: The patient's family history includes Alzheimer's disease in his mother.  ROS:   Please see the history of present illness.    *** All other systems reviewed and are negative.  Labs/Other Studies Reviewed:    The following studies were reviewed today:  Echo 07/21/22 1. Left ventricular ejection fraction, by estimation, is 70 to 75%. The  left ventricle has hyperdynamic function. The left ventricle has no  regional wall motion abnormalities. There is mild left ventricular  hypertrophy. Left ventricular diastolic  parameters are consistent with Grade I diastolic dysfunction (impaired  relaxation).   2. Right ventricular systolic function is normal. The right ventricular  size is normal.   3. The mitral valve is grossly normal. Trivial mitral valve  regurgitation.   4. The aortic valve is tricuspid. Aortic valve regurgitation is  mild.  Aortic valve sclerosis is present, with no evidence of aortic valve  stenosis.   5. The inferior vena cava is normal in size with greater than 50%  respiratory variability, suggesting right atrial pressure of 3 mmHg.   Comparison(s): Changes from prior study are noted. 12/26/2018: LVEF 60-65%,  mild AI.   Cardiac Monitor 07/18/22   Predominately normal sinus rhythm   Rare episodes of nonsustained supraventricular tachycardia.   4-6 beats.   these are not significant   No serious arrhythmias to explain his episodes of near syncope   Echo 12/26/18 1. The left ventricle has normal systolic function with an ejection  fraction of 60-65%. The cavity size was normal. Left ventricular diastolic  parameters were normal.   2. The right ventricle has normal systolic function. The  cavity was  normal. There is no increase in right ventricular wall thickness. Right  ventricular systolic pressure could not be assessed.   3. No evidence of mitral valve stenosis.   4. The aortic valve is tricuspid. Aortic valve regurgitation is mild by  color flow Doppler. No stenosis of the aortic valve.   5. The aortic root, ascending aorta and aortic arch are normal in size  and structure.   CCTA 12/15/18 1. Coronary artery calcium score 168 Agatston units. This places the patient in the 50th percentile for age and gender, suggesting intermediate risk for future cardiac events.   2.  Nonobstructive CAD.  Recent Labs: 06/27/2022: ALT 36; BUN 21; Creatinine, Ser 1.29; Magnesium 2.2; Potassium 4.4; Sodium 141; TSH 1.750  Recent Lipid Panel    Component Value Date/Time   CHOL 156 09/26/2021 0807   TRIG 100 09/26/2021 0807   HDL 51 09/26/2021 0807   CHOLHDL 3.1 09/26/2021 0807   LDLCALC 87 09/26/2021 0807     Risk Assessment/Calculations:       Physical Exam:    VS:  There were no vitals taken for this visit.    Wt Readings from Last 3 Encounters:  09/01/22 203 lb 3.2 oz (92.2 kg)  06/27/22 201 lb (91.2 kg)  06/28/21 195 lb 12.8 oz (88.8 kg)     GEN:  Well nourished, well developed in no acute distress HEENT: Normal NECK: No JVD; No carotid bruits CARDIAC: RRR, no murmurs, rubs, gallops RESPIRATORY:  Clear to auscultation without rales, wheezing or rhonchi  ABDOMEN: Soft, non-tender, non-distended MUSCULOSKELETAL:  No edema; No deformity. 2+ pedal pulses, equal bilaterally SKIN: Warm and dry NEUROLOGIC:  Alert and oriented x 3 PSYCHIATRIC:  Normal affect   EKG:  EKG is ***  Diagnoses:    No diagnosis found.  Assessment and Plan:     Near syncope: Frequent episodes of fatigue, DOE, that require him to lay down to recover  occurring at various times with various levels of activity. No syncope, palpitations, or chest pain. Feels that symptoms worsen with  sudden acceleration in HR. We will get a 7 day Zio monitor for evaluation of arrhythmia. Will get echo to evaluate heart and valve function. We will check TSH, complete metabolic panel, and magnesium today to ensure abnormalities are not contributing to symptoms.   CAD without angina: Mild nonobstructive CAD on coronary CTA 12/15/2018. He denies chest pain. Having symptoms of increased DOE and fatigue.  Symptoms are somewhat atypical for angina and that they do not worsen with exertion. Will get Zio monitor and echo for evaluation. Consider repeat CCTA or stress test if monitor and echo are unremarkable.   DOE: LVEF  123456, normal diastolic parameters, normal RV function, mild AI on echo 12/2018. As noted above, recent increase in DOE accompanied by fatigue with various levels of activity. No shortness of breath with exertion on a consistent basis.  No edema, orthopnea, PND.  No evidence of volume overload on exam.  Aortic valve insufficiency: Mild aortic valve regurgitation on echo 12/2018.  I do not appreciate a significant murmur on exam. Does not have a wide pulse pressure. Will evaluate valve function on echo.   Hyperlipidemia LDL goal < 70: LDL 87 on 09/26/2021. Will discuss further measures to get to goal at next office visit. Continue rosuvastatin and ezetimibe.      Disposition: ***  Medication Adjustments/Labs and Tests Ordered: Current medicines are reviewed at length with the patient today.  Concerns regarding medicines are outlined above.  No orders of the defined types were placed in this encounter.  No orders of the defined types were placed in this encounter.   There are no Patient Instructions on file for this visit.   Signed, Emmaline Life, NP  09/18/2022 7:40 PM    [place]

## 2022-09-25 ENCOUNTER — Encounter: Payer: Self-pay | Admitting: Nurse Practitioner

## 2022-09-25 ENCOUNTER — Ambulatory Visit: Payer: Medicare PPO | Admitting: Nurse Practitioner

## 2022-09-25 ENCOUNTER — Ambulatory Visit (INDEPENDENT_AMBULATORY_CARE_PROVIDER_SITE_OTHER): Payer: Medicare PPO | Admitting: Pulmonary Disease

## 2022-09-25 VITALS — BP 130/80 | HR 87 | Ht 70.0 in | Wt 201.8 lb

## 2022-09-25 DIAGNOSIS — J3089 Other allergic rhinitis: Secondary | ICD-10-CM | POA: Diagnosis not present

## 2022-09-25 DIAGNOSIS — R053 Chronic cough: Secondary | ICD-10-CM | POA: Diagnosis not present

## 2022-09-25 DIAGNOSIS — J4520 Mild intermittent asthma, uncomplicated: Secondary | ICD-10-CM

## 2022-09-25 DIAGNOSIS — R0609 Other forms of dyspnea: Secondary | ICD-10-CM | POA: Diagnosis not present

## 2022-09-25 DIAGNOSIS — R0602 Shortness of breath: Secondary | ICD-10-CM | POA: Diagnosis not present

## 2022-09-25 DIAGNOSIS — J449 Chronic obstructive pulmonary disease, unspecified: Secondary | ICD-10-CM | POA: Diagnosis not present

## 2022-09-25 LAB — PULMONARY FUNCTION TEST
DL/VA % pred: 85 %
DL/VA: 3.4 ml/min/mmHg/L
DLCO cor % pred: 102 %
DLCO cor: 25.71 ml/min/mmHg
DLCO unc % pred: 102 %
DLCO unc: 25.71 ml/min/mmHg
FEF 25-75 Post: 2.7 L/sec
FEF 25-75 Pre: 2.72 L/sec
FEF2575-%Change-Post: 0 %
FEF2575-%Pred-Post: 121 %
FEF2575-%Pred-Pre: 122 %
FEV1-%Change-Post: 1 %
FEV1-%Pred-Post: 115 %
FEV1-%Pred-Pre: 114 %
FEV1-Post: 3.55 L
FEV1-Pre: 3.51 L
FEV1FVC-%Change-Post: 3 %
FEV1FVC-%Pred-Pre: 99 %
FEV6-%Change-Post: 0 %
FEV6-%Pred-Post: 117 %
FEV6-%Pred-Pre: 118 %
FEV6-Post: 4.66 L
FEV6-Pre: 4.71 L
FEV6FVC-%Change-Post: 1 %
FEV6FVC-%Pred-Post: 104 %
FEV6FVC-%Pred-Pre: 103 %
FVC-%Change-Post: -2 %
FVC-%Pred-Post: 111 %
FVC-%Pred-Pre: 114 %
FVC-Post: 4.74 L
FVC-Pre: 4.84 L
Post FEV1/FVC ratio: 75 %
Post FEV6/FVC ratio: 98 %
Pre FEV1/FVC ratio: 73 %
Pre FEV6/FVC Ratio: 97 %
RV % pred: 100 %
RV: 2.56 L
TLC % pred: 114 %
TLC: 8.08 L

## 2022-09-25 LAB — POCT EXHALED NITRIC OXIDE: FeNO level (ppb): 12

## 2022-09-25 MED ORDER — FLUTICASONE PROPIONATE 50 MCG/ACT NA SUSP: 1.0000 | Freq: Every day | NASAL | 2 refills | Status: DC

## 2022-09-25 NOTE — Progress Notes (Signed)
Full PFT performed today. °

## 2022-09-25 NOTE — Progress Notes (Unsigned)
$'@Patient'd$  ID: Billy Erickson, male    DOB: October 22, 1945, 77 y.o.   MRN: [id number]  No chief complaint on file.   Referring provider: Marda Stalker, PA-C  HPI: 77 year old male, never smoker followed for shortness of breath. He is a patient of Dr. August Albino and last seen in office 09/01/2022. Past medical history significant for CAD, exercise induced bronchospasm, chronic sinusitis, GERD, sciatica, BPH, HLD, insomnia.   TEST/EVENTS:   09/25/2022: Today - follow up Patient presents today to go over pulmonary function testing, which was normal. He was started on Spiriva at his last visit. He does feel like this has helped his shortness of breath. Still occasionally notices some chest tightness, especially with certain outdoor activities. He has a cough, which started the beginning of the year. Minimally productive with clear phlegm. He's not sure if it's related but they also got two cats at the end of 2023. He's had dogs before and cats years ago. Never known to be allergic. He has increased throat clearing and feeling like there's some drainage in his throat but doesn't necessarily feel like he's congestion. He denies wheezing, sneezing, fevers, chills, orthopnea, leg swelling, palpitations, dizziness. No childhood asthma. Was diagnosed with exercise induced bronchospasm in the past but albuterol never did much for him.  e   Allergies  Allergen Reactions   Diphenhydramine Hcl     Other reaction(s): Other (See Comments)   Sudafed [Pseudoephedrine] Anxiety and Other (See Comments)    Anxious, unable to urinate,"jittery"    Immunization History  Administered Date(s) Administered   Fluad Quad(high Dose 65+) 04/09/2020   Influenza Split 04/07/2020   Influenza, High Dose Seasonal PF 06/19/2014, 04/21/2017, 03/22/2018, 06/12/2018, 04/04/2021   PFIZER Comirnaty(Gray Top)Covid-19 Tri-Sucrose Vaccine 11/10/2020   PFIZER(Purple Top)SARS-COV-2 Vaccination 08/28/2019, 08/31/2019, 09/22/2019,  04/29/2020   Pfizer Covid-19 Vaccine Bivalent Booster 18yr & up 04/04/2021   Pneumococcal Conjugate-13 06/05/2017   Pneumococcal Polysaccharide-23 03/07/2019   Tdap 03/07/2019   Zoster Recombinat (Shingrix) 11/22/2017, 03/12/2018   Zoster, Live 11/22/2017, 03/12/2018    Past Medical History:  Diagnosis Date   Aortic regurgitation    [facility]   Arthritis    Dyspnea    with exercise only   Enlarged prostate    GERD (gastroesophageal reflux disease)    History of kidney stones    Hyperlipidemia    Kidney stones    Liver cyst    Wrist pain, right     Tobacco History: Social History   Tobacco Use  Smoking Status Never  Smokeless Tobacco Never   Counseling given: Not Answered   Outpatient Medications Prior to Visit  Medication Sig Dispense Refill   albuterol (VENTOLIN HFA) 108 (90 Base) MCG/ACT inhaler Inhale 2 puffs into the lungs every 6 (six) hours as needed for wheezing or shortness of breath.     aspirin EC 81 MG tablet Take 1 tablet (81 mg total) by mouth daily. 30 tablet 0   ezetimibe (ZETIA) 10 MG tablet TAKE 1 TABLET BY MOUTH EVERY DAY 90 tablet 0   gabapentin (NEURONTIN) 300 MG capsule Take 300 mg by mouth at bedtime.   1   Glucosamine-Chondroit-Vit C-Mn (GLUCOSAMINE 1500 COMPLEX PO) Take 1 tablet by mouth daily.     ibuprofen (ADVIL,MOTRIN) 100 MG tablet Take 200 mg by mouth every 6 (six) hours as needed for pain or fever.     methocarbamol (ROBAXIN) 500 MG tablet Take 1 tablet (500 mg total) by mouth  every 6 (six) hours as needed for muscle spasms. 90 tablet 2   Multiple Vitamin (MULTIVITAMIN WITH MINERALS) TABS tablet Take 1 tablet by mouth daily.     omeprazole (PRILOSEC) 20 MG capsule Take 20 mg by mouth daily.     rosuvastatin (CRESTOR) 40 MG tablet TAKE 1 TABLET BY MOUTH EVERY DAY 90 tablet 3   sildenafil (REVATIO) 20 MG tablet Take 20 mg by mouth 3 (three) times daily.     tamsulosin (FLOMAX) 0.4 MG CAPS capsule  Take 0.4 mg by mouth daily.  3   Tiotropium Bromide Monohydrate (SPIRIVA RESPIMAT) 2.5 MCG/ACT AERS Inhale 2 puffs into the lungs daily. 1 g 5   zolpidem (AMBIEN CR) 6.25 MG CR tablet Take 6.25 mg by mouth at bedtime as needed for sleep.   0   No facility-administered medications prior to visit.     Review of Systems:   Constitutional: No weight loss or gain, night sweats, fevers, chills, fatigue, or lassitude. HEENT: No headaches, difficulty swallowing, tooth/dental problems, or sore throat. No sneezing, itching, ear ache, nasal congestion, or post nasal drip CV:  No chest pain, orthopnea, PND, swelling in lower extremities, anasarca, dizziness, palpitations, syncope Resp: No shortness of breath with exertion or at rest. No excess mucus or change in color of mucus. No productive or non-productive. No hemoptysis. No wheezing.  No chest wall deformity GI:  No heartburn, indigestion, abdominal pain, nausea, vomiting, diarrhea, change in bowel habits, loss of appetite, bloody stools.  GU: No dysuria, change in color of urine, urgency or frequency.  No flank pain, no hematuria  Skin: No rash, lesions, ulcerations MSK:  No joint pain or swelling.  No decreased range of motion.  No back pain. Neuro: No dizziness or lightheadedness.  Psych: No depression or anxiety. Mood stable.     Physical Exam:  There were no vitals taken for this visit.  GEN: Pleasant, interactive, well-nourished/chronically-ill appearing/acutely-ill appearing/poorly-nourished/morbidly obese; in no acute distress.****** HEENT:  Normocephalic and atraumatic. EACs patent bilaterally. TM pearly gray with present light reflex bilaterally. PERRLA. Sclera white. Nasal turbinates pink, moist and patent bilaterally. No rhinorrhea present. Oropharynx pink and moist, without exudate or edema. No lesions, ulcerations, or postnasal drip.  NECK:  Supple w/ fair ROM. No JVD present. Normal carotid impulses w/o bruits. Thyroid symmetrical  with no goiter or nodules palpated. No lymphadenopathy.   CV: RRR, no m/r/g, no peripheral edema. Pulses intact, +2 bilaterally. No cyanosis, pallor or clubbing. PULMONARY:  Unlabored, regular breathing. Clear bilaterally A&P w/o wheezes/rales/rhonchi. No accessory muscle use.  GI: BS present and normoactive. Soft, non-tender to palpation. No organomegaly or masses detected. No CVA tenderness. MSK: No erythema, warmth or tenderness. Cap refil <2 sec all extrem. No deformities or joint swelling noted.  Neuro: A/Ox3. No focal deficits noted.   Skin: Warm, no lesions or rashe Psych: Normal affect and behavior. Judgement and thought content appropriate.     Lab Results:  CBC    Component Value Date/Time   WBC 4.1 08/19/2020 1109   RBC 4.86 08/19/2020 1109   HGB 15.5 08/19/2020 1109   HCT 45.2 08/19/2020 1109   PLT 154 08/19/2020 1109   MCV 93.0 08/19/2020 1109   MCH 31.9 08/19/2020 1109   MCHC 34.3 08/19/2020 1109   RDW 12.2 08/19/2020 1109   LYMPHSABS 1.3 04/18/2018 1603   MONOABS 1.1 (H) 04/18/2018 1603   EOSABS 0.1 04/18/2018 1603   BASOSABS 0.0 04/18/2018 1603    BMET    Component  Value Date/Time   NA 141 06/27/2022 1604   K 4.4 06/27/2022 1604   CL 105 06/27/2022 1604   CO2 22 06/27/2022 1604   GLUCOSE 109 (H) 06/27/2022 1604   GLUCOSE 98 08/19/2020 1109   BUN 21 06/27/2022 1604   CREATININE 1.29 (H) 06/27/2022 1604   CALCIUM 10.0 06/27/2022 1604   GFRNONAA >60 08/19/2020 1109   GFRAA 72 07/01/2020 0723    BNP No results found for: "BNP"   Imaging:  No results found.       Latest Ref Rng & Units 09/25/2022    2:57 PM  PFT Results  FVC-Pre L 4.84  P  FVC-Predicted Pre % 114  P  FVC-Post L 4.74  P  FVC-Predicted Post % 111  P  Pre FEV1/FVC % % 73  P  Post FEV1/FCV % % 75  P  FEV1-Pre L 3.51  P  FEV1-Predicted Pre % 114  P  FEV1-Post L 3.55  P  DLCO uncorrected ml/min/mmHg 25.71  P  DLCO UNC% % 102  P  DLCO corrected ml/min/mmHg 25.71  P  DLCO COR  %Predicted % 102  P  DLVA Predicted % 85  P  TLC L 8.08  P  TLC % Predicted % 114  P  RV % Predicted % 100  P    P Preliminary result    No results found for: "NITRICOXIDE"      Assessment & Plan:   No problem-specific Assessment & Plan notes found for this encounter.   I spent *** minutes of dedicated to the care of this patient on the date of this encounter to include pre-visit review of records, face-to-face time with the patient discussing conditions above, post visit ordering of testing, clinical documentation with the electronic health record, making appropriate referrals as documented, and communicating necessary findings to members of the patients care team.  Clayton Bibles, NP 09/25/2022  Pt aware and understands NP's role.

## 2022-09-25 NOTE — Patient Instructions (Addendum)
Continue Albuterol inhaler 2 puffs every 6 hours as needed for shortness of breath or wheezing. Notify if symptoms persist despite rescue inhaler/neb use. Continue Spiriva 2 puffs daily. I will call you after I get your labs back and discuss possible alternatives   Trial daily over the counter allergy pill such as Claritin, Zyrtec, Xyzal or allegra daily Flonase nasal spray 2 sprays each nostril daily for postnasal drainage  Labs today - allergen panel, CBC with diff  Follow up in 4 months with Dr. Erin Fulling or Alanson Aly. If symptoms do not improve or worsen, please contact office for sooner follow up or seek emergency care.

## 2022-09-25 NOTE — Patient Instructions (Signed)
Full PFT performed today. °

## 2022-09-26 ENCOUNTER — Ambulatory Visit: Payer: Medicare PPO | Admitting: Nurse Practitioner

## 2022-09-26 DIAGNOSIS — J45909 Unspecified asthma, uncomplicated: Secondary | ICD-10-CM | POA: Insufficient documentation

## 2022-09-26 LAB — CBC WITH DIFFERENTIAL/PLATELET
Basophils Absolute: 0.1 10*3/uL (ref 0.0–0.1)
Basophils Relative: 1.1 % (ref 0.0–3.0)
Eosinophils Absolute: 0.1 10*3/uL (ref 0.0–0.7)
Eosinophils Relative: 1.2 % (ref 0.0–5.0)
HCT: 43.2 % (ref 39.0–52.0)
Hemoglobin: 14.7 g/dL (ref 13.0–17.0)
Lymphocytes Relative: 21.2 % (ref 12.0–46.0)
Lymphs Abs: 1.2 10*3/uL (ref 0.7–4.0)
MCHC: 34.1 g/dL (ref 30.0–36.0)
MCV: 93.4 fl (ref 78.0–100.0)
Monocytes Absolute: 0.6 10*3/uL (ref 0.1–1.0)
Monocytes Relative: 11.3 % (ref 3.0–12.0)
Neutro Abs: 3.5 10*3/uL (ref 1.4–7.7)
Neutrophils Relative %: 65.2 % (ref 43.0–77.0)
Platelets: 165 10*3/uL (ref 150.0–400.0)
RBC: 4.63 Mil/uL (ref 4.22–5.81)
RDW: 13 % (ref 11.5–15.5)
WBC: 5.4 10*3/uL (ref 4.0–10.5)

## 2022-09-26 NOTE — Assessment & Plan Note (Signed)
Possible reactive airway disease. Triggered by exercise and allergies? Increased cough with cats in the home. He seems to receive benefit from [place] therapy. FeNO nl today. We will check CBC with diff and allergen panel. May consider changing him to ICS/LABA therapy if he has elevated eosinophils or allergic component.   Patient Instructions  Continue Albuterol inhaler 2 puffs every 6 hours as needed for shortness of breath or wheezing. Notify if symptoms persist despite rescue inhaler/neb use. Continue Spiriva 2 puffs daily. I will call you after I get your labs back and discuss possible alternatives   Trial daily over the counter allergy pill such as Claritin, Zyrtec, Xyzal or allegra daily Flonase nasal spray 2 sprays each nostril daily for postnasal drainage  Labs today - allergen panel, CBC with diff  Follow up in 4 months with Dr. Erin Fulling or Alanson Aly. If symptoms do not improve or worsen, please contact office for sooner follow up or seek emergency care.

## 2022-09-27 ENCOUNTER — Encounter: Payer: Self-pay | Admitting: Nurse Practitioner

## 2022-09-27 DIAGNOSIS — J309 Allergic rhinitis, unspecified: Secondary | ICD-10-CM | POA: Insufficient documentation

## 2022-09-27 NOTE — Assessment & Plan Note (Addendum)
Persistent DOE for years. Increased cough over the past few months; appears to be triggered by cats. May consider cardiac stress test vs cardiopulmonary exercise test if symptoms persist despite inhaler use. See above.

## 2022-09-27 NOTE — Assessment & Plan Note (Signed)
Target postnasal drainage with intranasal steroid and antihistamine. See above.

## 2022-09-29 LAB — ALLERGEN PANEL (27) + IGE
Alternaria Alternata IgE: <0.1 kU/L
Aspergillus Fumigatus IgE: <0.1 kU/L
Bahia Grass IgE: <0.1 kU/L
Bermuda Grass IgE: <0.1 kU/L
Cat Dander IgE: <0.1 kU/L
Cedar, Mountain IgE: <0.1 kU/L
Cladosporium Herbarum IgE: <0.1 kU/L
Cocklebur IgE: <0.1 kU/L
Cockroach, American IgE: <0.1 kU/L
Common Silver Birch IgE: <0.1 kU/L
D Farinae IgE: <0.1 kU/L
D Pteronyssinus IgE: <0.1 kU/L
Dog Dander IgE: <0.1 kU/L
Elm, American IgE: <0.1 kU/L
Hickory, White IgE: <0.1 kU/L
IgE (Immunoglobulin E), Serum: 12 IU/mL (ref 6–495)
Johnson Grass IgE: <0.1 kU/L
Kentucky Bluegrass IgE: <0.1 kU/L
Maple/Box Elder IgE: <0.1 kU/L
Mucor Racemosus IgE: <0.1 kU/L
Oak, White IgE: <0.1 kU/L
Penicillium Chrysogen IgE: <0.1 kU/L
Pigweed, Rough IgE: <0.1 kU/L
Plantain, English IgE: <0.1 kU/L
Ragweed, Short IgE: <0.1 kU/L
Setomelanomma Rostrat: <0.1 kU/L
Timothy Grass IgE: <0.1 kU/L
White Mulberry IgE: <0.1 kU/L

## 2022-10-02 ENCOUNTER — Other Ambulatory Visit: Payer: Self-pay | Admitting: Nurse Practitioner

## 2022-10-02 ENCOUNTER — Encounter (INDEPENDENT_AMBULATORY_CARE_PROVIDER_SITE_OTHER): Payer: Self-pay

## 2022-10-02 DIAGNOSIS — R0609 Other forms of dyspnea: Secondary | ICD-10-CM

## 2022-10-02 DIAGNOSIS — R55 Syncope and collapse: Secondary | ICD-10-CM

## 2022-10-02 NOTE — Progress Notes (Signed)
Allergen panel and CBC nl. He has very mild elevation of his peripheral eosinophils so unsure if an inhaled steroid would work better for him. Since he is still having trouble with SOB, fatigue, and dizziness at times and PFTs were nl, I am going to order cardiopulmonary exercise test for further evaluation. Someone will contact him to schedule this. Follow up with Dr. Erin Fulling as scheduled.

## 2022-11-07 ENCOUNTER — Encounter (HOSPITAL_COMMUNITY): Payer: Medicare PPO

## 2022-11-10 ENCOUNTER — Ambulatory Visit (HOSPITAL_COMMUNITY): Payer: Medicare PPO | Attending: Nurse Practitioner

## 2022-11-10 DIAGNOSIS — R0609 Other forms of dyspnea: Secondary | ICD-10-CM | POA: Insufficient documentation

## 2022-11-10 DIAGNOSIS — R55 Syncope and collapse: Secondary | ICD-10-CM | POA: Diagnosis not present

## 2022-11-15 NOTE — Progress Notes (Signed)
There was a 12% reduction in FEV1 post-exercise and is suggestive of exercise induced bronchospasm or reactive airway with exertion (consistent with asthma). Otherwise, testing was normal. Needs follow up with Dr. Francine Graven. This was never scheduled after his last visit. Thanks.

## 2022-11-17 NOTE — Progress Notes (Signed)
I called and spoke with the pt and notified of results/recs per [facility]. Pt verbalized understanding. I have scheduled him with Dr Francine Graven for 11/21/22.

## 2022-11-21 ENCOUNTER — Encounter: Payer: Self-pay | Admitting: Pulmonary Disease

## 2022-11-21 ENCOUNTER — Ambulatory Visit: Payer: Medicare PPO | Admitting: Pulmonary Disease

## 2022-11-21 VITALS — BP 130/70 | HR 75 | Ht 70.0 in | Wt 198.0 lb

## 2022-11-21 DIAGNOSIS — J4520 Mild intermittent asthma, uncomplicated: Secondary | ICD-10-CM

## 2022-11-21 MED ORDER — FLUTICASONE-SALMETEROL 250-50 MCG/ACT IN AEPB
1.0000 | INHALATION_SPRAY | Freq: Two times a day (BID) | RESPIRATORY_TRACT | 5 refills | Status: DC
Start: 2022-11-21 — End: 2023-06-28

## 2022-11-21 MED ORDER — FLUTICASONE-SALMETEROL 230-21 MCG/ACT IN AERO
2.0000 | INHALATION_SPRAY | Freq: Two times a day (BID) | RESPIRATORY_TRACT | 12 refills | Status: DC
Start: 2022-11-21 — End: 2022-11-21

## 2022-11-21 NOTE — Patient Instructions (Signed)
Your cardiopulmonary test shows possible exercise induced asthma  Start advair HFA inhaler 2 puffs twice daily  - rinse mouth out after each use  Use albuterol inhaler 1-2 puffs every 4-6 hours as needed  Follow up in 6 months

## 2022-11-21 NOTE — Progress Notes (Signed)
Synopsis: Referred in February 2024 for shortness of breath by Jarrett Soho, PA  Subjective:   PATIENT ID: Billy Erickson GENDER: male DOB: September 10, 1945, MRN: [id number]  HPI  Chief Complaint  Patient presents with   Follow-up    F/U to discuss stress test results.    Billy Erickson is a 77 year old male, never smoker with history of GERD, HLD and mild aortic regurgitation who returns to pulmonary clinic for shortness of breath.   He was trialed on spiriva 2.30mcg 2 puffs daily after initial visit with some improvement in his dyspnea.   He was seen by Rhunette Croft, NP 09/25/22. PFTs were within normal limits. FeNO was within normal limits. He was started on steroid nasal spray and antihistamine allergy medicine. IgE level 12 and absolute eosinophils 100.   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 He has normal exercise capacity overall.   Initial OV 09/01/22 He reports having dyspnea since 2020. He notices the shortness of breath with going up stairs, bending over, or doing heavy exertional work in his garden. He denies wheezing or cough. He has been using albuterol inhaler as needed for the dyspnea with out relief. He denies any night time awakenings due to shortness of breath.   He was seen by cardiology 06/27/22. Echo showed hypderdynamic LV with EF 70-75%. He had extended heart monitoring performed with no significant arrhythmias noted. CT Coronary Scan 2020 showed non obstructive CAD with coronary calcium score of 168, placing him in the 50th percentile for his age/gender.  He is a retired Radio producer. He was active during his career as he commuted from [place] to [employer] via bike. He continues to exercise most days of the week. Denies any limitations with his work outs. He was a Surveyor, minerals for 4-5 years. No dust or chemical exposures over the years. He is working on his  family genealogy. He has 2 grandchildren. His father died of obstructive lung disease.   Past Medical History:  Diagnosis Date   Aortic regurgitation    mild to moderate AR 05/29/16 echo [facility])   Arthritis    Dyspnea    with exercise only   Enlarged prostate    GERD (gastroesophageal reflux disease)    History of kidney stones    Hyperlipidemia    Kidney stones    Liver cyst    Wrist pain, right      Family History  Problem Relation Age of Onset   Alzheimer's disease Mother      Social History   Socioeconomic History   Marital status: Married    Spouse name: Not on file   Number of children: Not on file   Years of education: Not on file   Highest education level: Not on file  Occupational History   Not on file  Tobacco Use   Smoking status: Never   Smokeless tobacco: Never  Vaping Use   Vaping Use: Never used  Substance and Sexual Activity   Alcohol use: Yes    Alcohol/week: 6.0 standard drinks of alcohol    Types: 6 Glasses of wine per week    Comment: social   Drug use: No   Sexual activity: Not on file  Other Topics Concern   Not on file  Social History Narrative   Not on file   Social Determinants of Health   Financial Resource Strain: Not on file  Food  Insecurity: Not on file  Transportation Needs: Not on file  Physical Activity: Not on file  Stress: Not on file  Social Connections: Not on file  Intimate Partner Violence: Not on file     Allergies  Allergen Reactions   Diphenhydramine Hcl     Other reaction(s): Other (See Comments)   Sudafed [Pseudoephedrine] Anxiety and Other (See Comments)    Anxious, unable to urinate,"jittery"     Outpatient Medications Prior to Visit  Medication Sig Dispense Refill   albuterol (VENTOLIN HFA) 108 (90 Base) MCG/ACT inhaler Inhale 2 puffs into the lungs every 6 (six) hours as needed for wheezing or shortness of breath.     aspirin EC 81 MG tablet Take 1 tablet (81 mg total) by mouth daily. 30  tablet 0   ezetimibe (ZETIA) 10 MG tablet TAKE 1 TABLET BY MOUTH EVERY DAY 90 tablet 0   fluticasone (FLONASE) 50 MCG/ACT nasal spray Place 1 spray into both nostrils daily. 18.2 mL 2   gabapentin (NEURONTIN) 300 MG capsule Take 300 mg by mouth at bedtime.   1   Glucosamine-Chondroit-Vit C-Mn (GLUCOSAMINE 1500 COMPLEX PO) Take 1 tablet by mouth daily.     ibuprofen (ADVIL,MOTRIN) 100 MG tablet Take 200 mg by mouth every 6 (six) hours as needed for pain or fever.     methocarbamol (ROBAXIN) 500 MG tablet Take 1 tablet (500 mg total) by mouth every 6 (six) hours as needed for muscle spasms. 90 tablet 2   Multiple Vitamin (MULTIVITAMIN WITH MINERALS) TABS tablet Take 1 tablet by mouth daily.     omeprazole (PRILOSEC) 20 MG capsule Take 20 mg by mouth daily.     rosuvastatin (CRESTOR) 40 MG tablet TAKE 1 TABLET BY MOUTH EVERY DAY 90 tablet 3   sildenafil (REVATIO) 20 MG tablet Take 20 mg by mouth 3 (three) times daily.     tamsulosin (FLOMAX) 0.4 MG CAPS capsule Take 0.4 mg by mouth daily.  3   Tiotropium Bromide Monohydrate (SPIRIVA RESPIMAT) 2.5 MCG/ACT AERS Inhale 2 puffs into the lungs daily. 1 g 5   zolpidem (AMBIEN CR) 6.25 MG CR tablet Take 6.25 mg by mouth at bedtime as needed for sleep.   0   No facility-administered medications prior to visit.    Review of Systems  Constitutional:  Negative for chills, fever, malaise/fatigue and weight loss.  HENT:  Negative for congestion, sinus pain and sore throat.   Eyes: Negative.   Respiratory:  Positive for shortness of breath (with exertion). Negative for cough, hemoptysis, sputum production and wheezing.   Cardiovascular:  Negative for chest pain, palpitations, orthopnea, claudication and leg swelling.  Gastrointestinal:  Negative for abdominal pain, heartburn, nausea and vomiting.  Genitourinary: Negative.   Musculoskeletal:  Negative for joint pain and myalgias.  Skin:  Negative for rash.  Neurological:  Negative for weakness.   Endo/Heme/Allergies: Negative.   Psychiatric/Behavioral: Negative.     Objective:   Vitals:   11/21/22 1342  BP: 130/70  Pulse: 75  SpO2: 97%  Weight: 198 lb (89.8 kg)  Height: 5\' 10"  (1.778 m)    Physical Exam Constitutional:      General: He is not in acute distress. HENT:     Head: Normocephalic and atraumatic.  Eyes:     Conjunctiva/sclera: Conjunctivae normal.  Cardiovascular:     Rate and Rhythm: Normal rate and regular rhythm.     Pulses: Normal pulses.     Heart sounds: Normal heart sounds. No murmur heard. Musculoskeletal:  Right lower leg: No edema.     Left lower leg: No edema.  Skin:    General: Skin is warm and dry.  Neurological:     General: No focal deficit present.     Mental Status: He is alert.     CBC    Component Value Date/Time   WBC 5.4 09/25/2022 1632   RBC 4.63 09/25/2022 1632   HGB 14.7 09/25/2022 1632   HCT 43.2 09/25/2022 1632   PLT 165.0 09/25/2022 1632   MCV 93.4 09/25/2022 1632   MCH 31.9 08/19/2020 1109   MCHC 34.1 09/25/2022 1632   RDW 13.0 09/25/2022 1632   LYMPHSABS 1.2 09/25/2022 1632   MONOABS 0.6 09/25/2022 1632   EOSABS 0.1 09/25/2022 1632   BASOSABS 0.1 09/25/2022 1632      Latest Ref Rng & Units 06/27/2022    4:04 PM 08/19/2020   11:09 AM 07/01/2020    7:23 AM  BMP  Glucose 70 - 99 mg/dL 865  98  96   BUN 8 - 27 mg/dL 21  18  18    Creatinine 0.76 - 1.27 mg/dL 7.84  6.96  2.95   BUN/Creat Ratio 10 - 24 16   16    Sodium 134 - 144 mmol/L 141  141  142   Potassium 3.5 - 5.2 mmol/L 4.4  4.1  4.3   Chloride 96 - 106 mmol/L 105  106  106   CO2 20 - 29 mmol/L 22  26  23    Calcium 8.6 - 10.2 mg/dL 28.4  9.3  9.6    Chest imaging: CXR 07/31/22 Lungs are adequately inflated and otherwise clear. Cardiomediastinal silhouette and remainder of the exam is unchanged.  PFT:    Latest Ref Rng & Units 09/25/2022    2:57 PM  PFT Results  FVC-Pre L 4.84   FVC-Predicted Pre % 114   FVC-Post L 4.74   FVC-Predicted Post  % 111   Pre FEV1/FVC % % 73   Post FEV1/FCV % % 75   FEV1-Pre L 3.51   FEV1-Predicted Pre % 114   FEV1-Post L 3.55   DLCO uncorrected ml/min/mmHg 25.71   DLCO UNC% % 102   DLCO corrected ml/min/mmHg 25.71   DLCO COR %Predicted % 102   DLVA Predicted % 85   TLC L 8.08   TLC % Predicted % 114   RV % Predicted % 100     Labs:  Path:  Echo 07/21/22: LV EF 70-75% with hyperdynamic function. Grade I diastolic dysfunction. RV systolic size and function are normal. Mild aortic valve regurgitation.  Long Term Heart Monitor - 7 days 07/13/22   Predominately normal sinus rhythm   Rare episodes of nonsustained supraventricular tachycardia.   4-6 beats.   these are not significant   No serious arrhythmias to explain his episodes of near syncope  Heart Catheterization:  Cardiopulmonary Exercise Test 11/10/22 Conclusion: Exercise testing with gas exchange demonstrates normal (actually excellent) functional capacity when compared to matched sedentary norms. Patient appears primarily ventilatory limited with depleted breathing reserve and is most likely demonstrating hyperventilation at peak exercise (with high VE/MVV, low PETCO2). However, elevated pulmonary pressured with exercise cannot be excluded. Although it does not meet diagnostic criteria, there was a 12% reduction in FEV1  post-exercise and is suggestive of exercise induced bronchospasm or reactive airway with exertion.   Assessment & Plan:   Mild intermittent reactive airway disease without complication - Plan: fluticasone-salmeterol (ADVAIR) 250-50 MCG/ACT AEPB, DISCONTINUED: fluticasone-salmeterol (ADVAIR HFA)  230-21 MCG/ACT inhaler  Discussion: Billy Erickson is a 77 year old male, never smoker with history of GERD, HLD and mild aortic regurgitation who returns to pulmonary clinic for shortness of breath.   Cardiopulmonary exercise testing is notable for reduction in FEV1 postexercise concerning for exercise-induced bronchospasm.   Baseline pulmonary function tests are within normal limits.  We will start treatment for exercise-induced asthma with Advair 250-50 mcg 1 puff twice daily.  He can stop Spiriva inhaler.  He can use albuterol inhaler as needed.  Follow up in 6 months.  Melody Comas, MD [facility] Office: [phone]    Current Outpatient Medications:    albuterol (VENTOLIN HFA) 108 (90 Base) MCG/ACT inhaler, Inhale 2 puffs into the lungs every 6 (six) hours as needed for wheezing or shortness of breath., Disp: , Rfl:    aspirin EC 81 MG tablet, Take 1 tablet (81 mg total) by mouth daily., Disp: 30 tablet, Rfl: 0   ezetimibe (ZETIA) 10 MG tablet, TAKE 1 TABLET BY MOUTH EVERY DAY, Disp: 90 tablet, Rfl: 0   fluticasone (FLONASE) 50 MCG/ACT nasal spray, Place 1 spray into both nostrils daily., Disp: 18.2 mL, Rfl: 2   fluticasone-salmeterol (ADVAIR) 250-50 MCG/ACT AEPB, Inhale 1 puff into the lungs in the morning and at bedtime., Disp: 60 each, Rfl: 5   gabapentin (NEURONTIN) 300 MG capsule, Take 300 mg by mouth at bedtime. , Disp: , Rfl: 1   Glucosamine-Chondroit-Vit C-Mn (GLUCOSAMINE 1500 COMPLEX PO), Take 1 tablet by mouth daily., Disp: , Rfl:    ibuprofen (ADVIL,MOTRIN) 100 MG tablet, Take 200 mg by mouth every 6 (six) hours as needed for pain or fever., Disp: , Rfl:    methocarbamol (ROBAXIN) 500 MG tablet, Take 1 tablet (500 mg total) by mouth every 6 (six) hours as needed for muscle spasms., Disp: 90 tablet, Rfl: 2   Multiple Vitamin (MULTIVITAMIN WITH MINERALS) TABS tablet, Take 1 tablet by mouth daily., Disp: , Rfl:    omeprazole (PRILOSEC) 20 MG capsule, Take 20 mg by mouth daily., Disp: , Rfl:    rosuvastatin (CRESTOR) 40 MG tablet, TAKE 1 TABLET BY MOUTH EVERY DAY, Disp: 90 tablet, Rfl: 3   sildenafil (REVATIO) 20 MG tablet, Take 20 mg by mouth 3 (three) times daily., Disp: , Rfl:    tamsulosin (FLOMAX) 0.4 MG CAPS capsule, Take 0.4 mg by mouth daily., Disp: , Rfl: 3    Tiotropium Bromide Monohydrate (SPIRIVA RESPIMAT) 2.5 MCG/ACT AERS, Inhale 2 puffs into the lungs daily., Disp: 1 g, Rfl: 5   zolpidem (AMBIEN CR) 6.25 MG CR tablet, Take 6.25 mg by mouth at bedtime as needed for sleep. , Disp: , Rfl: 0

## 2022-11-24 ENCOUNTER — Encounter: Payer: Self-pay | Admitting: Pulmonary Disease

## 2022-11-28 DIAGNOSIS — M545 Low back pain, unspecified: Secondary | ICD-10-CM | POA: Diagnosis not present

## 2022-12-13 DIAGNOSIS — M533 Sacrococcygeal disorders, not elsewhere classified: Secondary | ICD-10-CM | POA: Diagnosis not present

## 2023-01-03 DIAGNOSIS — M533 Sacrococcygeal disorders, not elsewhere classified: Secondary | ICD-10-CM | POA: Diagnosis not present

## 2023-01-18 DIAGNOSIS — E785 Hyperlipidemia, unspecified: Secondary | ICD-10-CM | POA: Diagnosis not present

## 2023-01-18 DIAGNOSIS — M461 Sacroiliitis, not elsewhere classified: Secondary | ICD-10-CM | POA: Diagnosis not present

## 2023-01-18 DIAGNOSIS — Z6828 Body mass index (BMI) 28.0-28.9, adult: Secondary | ICD-10-CM | POA: Diagnosis not present

## 2023-01-18 DIAGNOSIS — Z0000 Encounter for general adult medical examination without abnormal findings: Secondary | ICD-10-CM | POA: Diagnosis not present

## 2023-01-18 DIAGNOSIS — J45990 Exercise induced bronchospasm: Secondary | ICD-10-CM | POA: Diagnosis not present

## 2023-01-18 DIAGNOSIS — M533 Sacrococcygeal disorders, not elsewhere classified: Secondary | ICD-10-CM | POA: Diagnosis not present

## 2023-01-18 DIAGNOSIS — M4714 Other spondylosis with myelopathy, thoracic region: Secondary | ICD-10-CM | POA: Diagnosis not present

## 2023-01-18 DIAGNOSIS — Z9181 History of falling: Secondary | ICD-10-CM | POA: Diagnosis not present

## 2023-01-18 DIAGNOSIS — N400 Enlarged prostate without lower urinary tract symptoms: Secondary | ICD-10-CM | POA: Diagnosis not present

## 2023-01-18 DIAGNOSIS — K219 Gastro-esophageal reflux disease without esophagitis: Secondary | ICD-10-CM | POA: Diagnosis not present

## 2023-01-19 ENCOUNTER — Encounter: Payer: Self-pay | Admitting: Physician Assistant

## 2023-01-23 ENCOUNTER — Other Ambulatory Visit: Payer: Self-pay | Admitting: Physician Assistant

## 2023-01-23 DIAGNOSIS — M5416 Radiculopathy, lumbar region: Secondary | ICD-10-CM

## 2023-01-29 DIAGNOSIS — R972 Elevated prostate specific antigen [PSA]: Secondary | ICD-10-CM | POA: Diagnosis not present

## 2023-01-29 DIAGNOSIS — N200 Calculus of kidney: Secondary | ICD-10-CM | POA: Diagnosis not present

## 2023-01-29 DIAGNOSIS — N401 Enlarged prostate with lower urinary tract symptoms: Secondary | ICD-10-CM | POA: Diagnosis not present

## 2023-01-29 DIAGNOSIS — R351 Nocturia: Secondary | ICD-10-CM | POA: Diagnosis not present

## 2023-02-01 ENCOUNTER — Ambulatory Visit
Admission: RE | Admit: 2023-02-01 | Discharge: 2023-02-01 | Disposition: A | Discharge status: Home / Self Care | Payer: Medicare PPO | Source: Ambulatory Visit | Attending: Physician Assistant | Admitting: Physician Assistant

## 2023-02-01 DIAGNOSIS — Z981 Arthrodesis status: Secondary | ICD-10-CM | POA: Diagnosis not present

## 2023-02-01 DIAGNOSIS — M5416 Radiculopathy, lumbar region: Secondary | ICD-10-CM

## 2023-02-01 DIAGNOSIS — M5432 Sciatica, left side: Secondary | ICD-10-CM | POA: Diagnosis not present

## 2023-02-01 MED ORDER — MEPERIDINE HCL 50 MG/ML IJ SOLN: 50.0000 mg | Freq: Once | INTRAMUSCULAR | Status: DC | PRN

## 2023-02-01 MED ORDER — ONDANSETRON HCL 4 MG/2ML IJ SOLN: 4.0000 mg | Freq: Once | INTRAMUSCULAR | Status: DC | PRN

## 2023-02-01 MED ORDER — IOPAMIDOL (ISOVUE-M 200) INJECTION 41%
20.0000 mL | Freq: Once | INTRAMUSCULAR | Status: AC
  Administered 2023-02-01: 20 mL via INTRATHECAL

## 2023-02-01 MED ORDER — DIAZEPAM 5 MG PO TABS
5.0000 mg | ORAL_TABLET | Freq: Once | ORAL | Status: AC
  Administered 2023-02-01: 5 mg via ORAL

## 2023-02-01 NOTE — Discharge Instructions (Signed)

## 2023-02-02 ENCOUNTER — Encounter (INDEPENDENT_AMBULATORY_CARE_PROVIDER_SITE_OTHER): Payer: Self-pay

## 2023-02-02 DIAGNOSIS — M533 Sacrococcygeal disorders, not elsewhere classified: Secondary | ICD-10-CM | POA: Diagnosis not present

## 2023-03-13 DIAGNOSIS — Z981 Arthrodesis status: Secondary | ICD-10-CM | POA: Diagnosis not present

## 2023-03-13 DIAGNOSIS — M5416 Radiculopathy, lumbar region: Secondary | ICD-10-CM | POA: Diagnosis not present

## 2023-03-18 DIAGNOSIS — M4726 Other spondylosis with radiculopathy, lumbar region: Secondary | ICD-10-CM | POA: Diagnosis not present

## 2023-03-18 DIAGNOSIS — M48061 Spinal stenosis, lumbar region without neurogenic claudication: Secondary | ICD-10-CM | POA: Diagnosis not present

## 2023-03-18 DIAGNOSIS — M4316 Spondylolisthesis, lumbar region: Secondary | ICD-10-CM | POA: Diagnosis not present

## 2023-03-18 DIAGNOSIS — M5116 Intervertebral disc disorders with radiculopathy, lumbar region: Secondary | ICD-10-CM | POA: Diagnosis not present

## 2023-03-27 DIAGNOSIS — M5417 Radiculopathy, lumbosacral region: Secondary | ICD-10-CM | POA: Diagnosis not present

## 2023-04-11 ENCOUNTER — Other Ambulatory Visit: Payer: Self-pay | Admitting: Cardiovascular Disease

## 2023-04-20 DIAGNOSIS — Z981 Arthrodesis status: Secondary | ICD-10-CM | POA: Diagnosis not present

## 2023-04-20 DIAGNOSIS — M5416 Radiculopathy, lumbar region: Secondary | ICD-10-CM | POA: Diagnosis not present

## 2023-05-16 DIAGNOSIS — U071 COVID-19: Secondary | ICD-10-CM | POA: Diagnosis not present

## 2023-05-16 DIAGNOSIS — Z6828 Body mass index (BMI) 28.0-28.9, adult: Secondary | ICD-10-CM | POA: Diagnosis not present

## 2023-05-16 DIAGNOSIS — R0600 Dyspnea, unspecified: Secondary | ICD-10-CM | POA: Diagnosis not present

## 2023-06-27 DIAGNOSIS — R61 Generalized hyperhidrosis: Secondary | ICD-10-CM | POA: Diagnosis not present

## 2023-06-27 DIAGNOSIS — R0600 Dyspnea, unspecified: Secondary | ICD-10-CM | POA: Diagnosis not present

## 2023-06-28 ENCOUNTER — Other Ambulatory Visit: Payer: Self-pay | Admitting: Pulmonary Disease

## 2023-06-28 DIAGNOSIS — J4520 Mild intermittent asthma, uncomplicated: Secondary | ICD-10-CM

## 2023-07-08 ENCOUNTER — Other Ambulatory Visit: Payer: Self-pay | Admitting: Cardiovascular Disease

## 2023-07-10 ENCOUNTER — Other Ambulatory Visit: Payer: Self-pay

## 2023-07-10 MED ORDER — EZETIMIBE 10 MG PO TABS: 10.0000 mg | ORAL_TABLET | Freq: Every day | ORAL | 0 refills | Status: DC

## 2023-07-27 DIAGNOSIS — R61 Generalized hyperhidrosis: Secondary | ICD-10-CM | POA: Diagnosis not present

## 2023-07-27 DIAGNOSIS — Z6830 Body mass index (BMI) 30.0-30.9, adult: Secondary | ICD-10-CM | POA: Diagnosis not present

## 2023-07-27 DIAGNOSIS — J069 Acute upper respiratory infection, unspecified: Secondary | ICD-10-CM | POA: Diagnosis not present

## 2023-08-08 DIAGNOSIS — R4189 Other symptoms and signs involving cognitive functions and awareness: Secondary | ICD-10-CM | POA: Diagnosis not present

## 2023-08-08 DIAGNOSIS — K219 Gastro-esophageal reflux disease without esophagitis: Secondary | ICD-10-CM | POA: Diagnosis not present

## 2023-08-08 DIAGNOSIS — G8929 Other chronic pain: Secondary | ICD-10-CM | POA: Diagnosis not present

## 2023-08-08 DIAGNOSIS — J4520 Mild intermittent asthma, uncomplicated: Secondary | ICD-10-CM | POA: Diagnosis not present

## 2023-08-08 DIAGNOSIS — E785 Hyperlipidemia, unspecified: Secondary | ICD-10-CM | POA: Diagnosis not present

## 2023-08-09 ENCOUNTER — Other Ambulatory Visit: Payer: Self-pay | Admitting: Family Medicine

## 2023-08-09 DIAGNOSIS — G8929 Other chronic pain: Secondary | ICD-10-CM | POA: Diagnosis not present

## 2023-08-09 DIAGNOSIS — Z711 Person with feared health complaint in whom no diagnosis is made: Secondary | ICD-10-CM

## 2023-08-09 DIAGNOSIS — M533 Sacrococcygeal disorders, not elsewhere classified: Secondary | ICD-10-CM | POA: Diagnosis not present

## 2023-08-09 DIAGNOSIS — M542 Cervicalgia: Secondary | ICD-10-CM | POA: Diagnosis not present

## 2023-08-09 DIAGNOSIS — M5416 Radiculopathy, lumbar region: Secondary | ICD-10-CM | POA: Diagnosis not present

## 2023-08-20 ENCOUNTER — Other Ambulatory Visit: Payer: Self-pay

## 2023-08-20 ENCOUNTER — Encounter: Payer: Self-pay | Admitting: Cardiovascular Disease

## 2023-08-20 MED ORDER — EZETIMIBE 10 MG PO TABS: 10.0000 mg | ORAL_TABLET | Freq: Every day | ORAL | 0 refills | Status: DC

## 2023-08-25 ENCOUNTER — Ambulatory Visit
Admission: RE | Admit: 2023-08-25 | Discharge: 2023-08-25 | Disposition: A | Discharge status: Home / Self Care | Payer: Medicare PPO | Source: Ambulatory Visit | Attending: Family Medicine | Admitting: Family Medicine

## 2023-08-25 ENCOUNTER — Other Ambulatory Visit: Payer: Self-pay | Admitting: Cardiovascular Disease

## 2023-08-25 DIAGNOSIS — Z711 Person with feared health complaint in whom no diagnosis is made: Secondary | ICD-10-CM

## 2023-08-25 DIAGNOSIS — R413 Other amnesia: Secondary | ICD-10-CM | POA: Diagnosis not present

## 2023-09-04 DIAGNOSIS — M461 Sacroiliitis, not elsewhere classified: Secondary | ICD-10-CM | POA: Diagnosis not present

## 2023-09-19 DIAGNOSIS — M542 Cervicalgia: Secondary | ICD-10-CM | POA: Diagnosis not present

## 2023-09-19 DIAGNOSIS — G8929 Other chronic pain: Secondary | ICD-10-CM | POA: Diagnosis not present

## 2023-09-19 DIAGNOSIS — M545 Low back pain, unspecified: Secondary | ICD-10-CM | POA: Diagnosis not present

## 2023-09-19 DIAGNOSIS — Z981 Arthrodesis status: Secondary | ICD-10-CM | POA: Diagnosis not present

## 2023-09-27 ENCOUNTER — Other Ambulatory Visit: Payer: Self-pay | Admitting: Cardiovascular Disease

## 2023-10-06 DIAGNOSIS — M47812 Spondylosis without myelopathy or radiculopathy, cervical region: Secondary | ICD-10-CM | POA: Diagnosis not present

## 2023-10-06 DIAGNOSIS — M4802 Spinal stenosis, cervical region: Secondary | ICD-10-CM | POA: Diagnosis not present

## 2023-10-16 DIAGNOSIS — M542 Cervicalgia: Secondary | ICD-10-CM | POA: Diagnosis not present

## 2023-10-22 ENCOUNTER — Other Ambulatory Visit: Payer: Self-pay | Admitting: Cardiovascular Disease

## 2023-10-26 DIAGNOSIS — M533 Sacrococcygeal disorders, not elsewhere classified: Secondary | ICD-10-CM | POA: Diagnosis not present

## 2023-10-26 DIAGNOSIS — M542 Cervicalgia: Secondary | ICD-10-CM | POA: Diagnosis not present

## 2023-11-01 ENCOUNTER — Other Ambulatory Visit: Payer: Self-pay | Admitting: Cardiovascular Disease

## 2023-11-19 ENCOUNTER — Ambulatory Visit: Payer: Medicare PPO | Admitting: Neurology

## 2023-11-19 ENCOUNTER — Encounter: Payer: Self-pay | Admitting: Neurology

## 2023-11-19 VITALS — BP 138/88 | HR 70 | Ht 70.0 in | Wt 198.8 lb

## 2023-11-19 DIAGNOSIS — G3184 Mild cognitive impairment, so stated: Secondary | ICD-10-CM | POA: Diagnosis not present

## 2023-11-19 DIAGNOSIS — R0609 Other forms of dyspnea: Secondary | ICD-10-CM | POA: Diagnosis not present

## 2023-11-19 DIAGNOSIS — I2510 Atherosclerotic heart disease of native coronary artery without angina pectoris: Secondary | ICD-10-CM

## 2023-11-19 MED ORDER — DONEPEZIL HCL 5 MG PO TABS: 5.0000 mg | ORAL_TABLET | Freq: Every day | ORAL | 2 refills | Status: DC

## 2023-11-19 NOTE — Patient Instructions (Addendum)
 Total time for face to face interview and examination, for review of  images and laboratory testing, neurophysiology testing and pre-existing records, including out-of -network , was 65 minutes. Assessment is as follows here:   The patient was enrolled into a [facility] study for patients with a family history of dementia and the effects of daily exercise.    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 No impairment of ADLs.  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  3) Suspect a depression component to the tests outcome.      Plan:  Treatment plan and additional workup planned after today includes:    1)  Billy Erickson will need to undergo  ATN and Alzheimer's genetic risk factor testing.  2) GNA dementia battery ordered.   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  I like for the patient to resume exercise.    Dependent on ATN and amyloid outcome , will decide on further PET scan type. In any case , I will order Aricept at 5 mg daily.  Plan to increase to 10 mg in follow up visit. .    Attending PHYSICIAN:   Billy Banner, MD    Problems With Thinking and Memory (Mild Neurocognitive Disorder): What to Know Mild neurocognitive disorder, formerly known as mild cognitive impairment, is a disorder where your memory doesn't work as well as it should. It may also affect other mental abilities like thinking, communicating, behavior, and being able to finish tasks. These problems can be noticed and measured. But they usually don't stop you from doing daily activities or living on your own. Mild neurocognitive disorder usually happens after 76 years of age. But it can also happen at younger ages. It's not as serious as major neurocognitive disorder, also known as  dementia, but it may be the first sign of it. In general, the symptoms of this condition get worse over time. In rare cases, symptoms can get better. What are the causes? This condition may be caused by: Brain disorders like Alzheimer's disease, Parkinson's disease, and other conditions that slowly damage nerve cells. Diseases that affect the blood vessels in the brain and cause small strokes. Certain infections, like HIV. Traumatic brain injury. Other medical conditions, such as brain tumors, underactive thyroid  (hypothyroidism), and not having enough vitamin B12. Using certain drugs or medicines. What increases the risk? Being older than 76 years of age. Being male. Having a lower level of education. Diabetes, high blood pressure, high cholesterol, and other conditions that raise the risk for blood vessel diseases. Untreated or undertreated sleep apnea. Having a certain type of gene that can be inherited, or passed down from parent to child. Long-term health problems like heart disease, lung disease, liver disease, kidney disease, or depression. What are the signs or symptoms? Trouble remembering things. You may: Forget names, phone numbers, or details of recent events. Forget about social events and appointments. Often forget where you put your car keys or other items. Trouble thinking and solving problems. You may have trouble with complex tasks like: Paying bills. Driving in places you don't know well. Trouble communicating. You may have trouble: Finding the right word or naming an object. Forming a sentence that makes sense. Understanding what you read or hear. Changes in your behavior or personality. When this  happens, you may: Lose interest in the things you used to enjoy. Avoid being around people. Get angry more easily than usual. Act before thinking. How is this diagnosed? This condition is diagnosed based on: Your symptoms. Your health care provider may ask you and the  people you spend time with, like family and friends, about your symptoms. Memory tests and other tests to check how your brain is working. Your provider may refer you to a provider called a neurologist or a mental health specialist. To try to find out the cause of your condition, your provider may: Get a detailed medical history. Ask about use of alcohol, drugs, and medicines. Do a physical exam. Order blood tests and brain imaging tests. How is this treated? Mild neurocognitive disorder that's caused by medicine use, drug use, infection, or another medical condition may get better when the cause is treated, or when medicines or drugs are stopped. If this disorder has another cause, it usually doesn't improve and may get worse. In these cases, the goal of treatment is to help you manage the symptoms. This may include: Medicines to help with memory and behavior symptoms. Talk therapy. This provides education, emotional support, memory aids, and other ways of making up for problems with mental tasks. Lifestyle changes. These may include: Getting regular exercise. Eating a healthy diet that includes omega-3 fatty acids. Doing things to challenge your thinking and memory skills. Spending more time being with and talking to other people. Using routines like having regular times for meals and going to bed. Follow these instructions at home: Eating and drinking  Drink more fluids as told. Eat a healthy diet that includes omega-3 fatty acids. These can be found in: Fish. Nuts. Leafy vegetables. Vegetable oils. If you drink alcohol: Limit how much you have to: 0-1 drink a day if you're male. 0-2 drinks a day if you're male. Know how much alcohol is in your drink. In the [place] one drink is one 12 oz bottle of beer (355 mL), one 5 oz glass of wine (148 mL), or one 1 oz glass of hard liquor (44 mL). Lifestyle  Get regular exercise as told by your provider. Do not smoke, vape, or use  nicotine or tobacco. Use healthy ways to manage stress. If you need help managing stress, ask your provider. Keep spending time with other people. Keep your mind active by doing activities you enjoy, like reading or playing games. Make sure you get good sleep at night. These tips can help: Try not to take naps during the day. Keep your bedroom dark and cool. Do not exercise in the few hours before you go to bed. Do not have foods or drinks with caffeine at night. General instructions Take medicines only as told. Your provider may tell you to avoid taking medicines that can affect thinking. These include some medicines for pain or sleeping. Work with your provider to find out: What things you need help with. What your safety needs are. Where to find more information [employer] on Aging: [email or web address] Contact a health care provider if: You have any new symptoms. Get help right away if: You have new confusion or your confusion gets worse. You act in ways that put you or your family in danger. This information is not intended to replace advice given to you by your health care provider. Make sure you discuss any questions you have with your health care provider. Document Revised: 01/02/2023 Document Reviewed: 01/02/2023 [employer] Patient Education  2024 [employer]Management of Memory Problems  There are some general things you can do to help manage your memory problems.  Your memory may not in fact recover, but by using techniques and strategies you will be able to manage your memory difficulties better.  1)  Establish a routine. Try to establish and then stick to a regular routine.  By doing this, you will get used to what to expect and you will reduce the need to rely on your memory.  Also, try to do things at the same time of day, such as taking your medication or checking your calendar first thing in the morning. Think about think that you can do as a part of a regular routine  and make a list.  Then enter them into a daily planner to remind you.  This will help you establish a routine.  2)  Organize your environment. Organize your environment so that it is uncluttered.  Decrease visual stimulation.  Place everyday items such as keys or cell phone in the same place every day (ie.  Basket next to front door) Use post it notes with a brief message to yourself (ie. Turn off light, lock the door) Use labels to indicate where things go (ie. Which cupboards are for food, dishes, etc.) Keep a notepad and pen by the telephone to take messages  3)  Memory Aids A diary or journal/notebook/daily planner Making a list (shopping list, chore list, to do list that needs to be done) Using an alarm as a reminder (kitchen timer or cell phone alarm) Using cell phone to store information (Notes, Calendar, Reminders) Calendar/White board placed in a prominent position Post-it notes  In order for memory aids to be useful, you need to have good habits.  It's no good remembering to make a note in your journal if you don't remember to look in it.  Try setting aside a certain time of day to look in journal.  4)  Improving mood and managing fatigue. There may be other factors that contribute to memory difficulties.  Factors, such as anxiety, depression and tiredness can affect memory. Regular gentle exercise can help improve your mood and give you more energy. Simple relaxation techniques may help relieve symptoms of anxiety Try to get back to completing activities or hobbies you enjoyed doing in the past. Learn to pace yourself through activities to decrease fatigue. Find out about some local support groups where you can share experiences with others. Try and achieve 7-8 hours of sleep at night.

## 2023-11-19 NOTE — Progress Notes (Signed)
 [facility]  Provider:  Dr Jaleal Schliep Referring Provider: Mordechai April, DO Primary Care Physician:  Darnelle Elders, PA-C  Chief Complaint  Patient presents with   New Patient (Initial Visit)    RM 1, Pt alone. Pt here on referral from PCP as Pt states is having memory problems. Pt states PCP also ordered MRI which has been completed. Pt states has word finding difficulties - states mother also had the issue and presumed she had AD at death in 39. Has spatial issues as well and gets lost driving without GPS.     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 Donalynn Fry for a Consultation/ Evaluation of memory loss . He has not undergone testing with PCP yet.    He lives with his wife, has adult children and young grandchildren.  He likes reading fiction, he has however a different memory  of the story than his wife extracts.  He smoked marihuana while in college , smoked until age 3.  Drinking alcohol,   He remembers a difficult time academically in [employer] school and college, he studied at [employer] . In 1969  he was reclassified from student, dropped out for 13 years to be drafted to [place] but a neurocognitive disorder was diagnosed and saved him.  He had failed calculus , took replacement courses.   Family history of a mother who died at 36 with dementia and onset in her sixties.  Father was cognitively intact at age 72 , the time of his death.  Father was drinking but functional .  He had  dropped out of college , [employer] [place] .  Shelah Derry into the [employer] and was deployed  in [place].  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   3 brothers have passed,  1 by suicide and 2 by cancer , he reports his brothers were drinking , the eldest heavily.  3 sisters - one older and 2 younger sisters are alive. Eldest with GI disease , youngest in chronic pain. 2 sons have ADD/ ADHD and had learning  difficulties.  Academically challenged.   His wife also has a memory disorder related to NF, delayed reading.    Medical history : no history of TBI  since a soccer injury at age 18.   Had a lumbar fusion at age 67 - in 2019, 7 hours under anesthesia and shortly after he retired.  He felt not functioning well. Covid then took the fun out of it " I was sick and tired of working " .  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 He had 2 different test protocols, suggestions for a curriculum overhaul, and interchanged them incidentally.   He has now noted names are harder to find, his granddaughters name didn't come to him, he has noted this in the last 2 years.  His wife noted that he has spatial memory difficulties and has noted him to have amnestic spells,  talking about dinner parties in the past or social engagements with others.  He developed trouble with directions but stated he as never good at directions. .  He has gotten lost in [place], his hometown for 10 years.   He takes daily naps of 30 minutes.    MRI brain reviewed.   Labs reviewed,  Depression - a good friend recently died, acute grief.  Easily tearing up.  Wife suffers from neurofibromatosis, onset in puberty, now having skin tumors all over.  She is isolating herself.  She liked wearing a mask during Covid.  She is a cancer survivor , had cancer at age 72 .  ADL ; Full scale all independent.    Review of Systems: Out of a complete 14 system review, the patient complains of only the following symptoms, and all other reviewed systems are negative.  Chronic pain, low back.  Asthma with exercise  Fusion L 4 through S1. Cervical spine  DDD,  foraminal stenosis.  GERD.   Excessive daytime sleepiness :  How likely are you to doze in the following situations: 0 = not likely, 1 = slight chance, 2 = moderate chance, 3 = high chance  Sitting and Reading? Watching  Television? Sitting inactive in a public place (theater or meeting)? Lying down in the afternoon when circumstances permit? Sitting and talking to someone? Sitting quietly after lunch without alcohol? In a car, while stopped for a few minutes in traffic? As a passenger in a car for an hour without a break?  Total = 16/ 24  FSS at 22/ 63.     11/19/2023    2:08 PM  MMSE - Mini Mental State Exam  Orientation to time 3  Orientation to Place 5  Registration 3  Attention/ Calculation out of 5  3  Recall 3  Language- name 2 objects 2  Language- repeat 1  Language- follow 3 step command 3  Language- read & follow direction 1  Write a sentence 1  Copy design 1  Total score 26       Social History   Socioeconomic History   Marital status: Married    Spouse name: Not on file   Number of children: Not on file   Years of education: Not on file   Highest education level: Not on file  Occupational History   Not on file  Tobacco Use   Smoking status: Never   Smokeless tobacco: Never  Vaping Use   Vaping status: Never Used  Substance and Sexual Activity   Alcohol use: Yes    Alcohol/week: 6.0 standard drinks of alcohol    Types: 6 Glasses of wine per week    Comment: social   Drug use: No   Sexual activity: Not on file  Other Topics Concern   Not on file  Social History Narrative   Not on file   Social Drivers of Health   Financial Resource Strain: Low Risk  (04/18/2021)   Received from [facility], [facility]   Overall Financial Resource Strain (CARDIA)    Difficulty of Paying Living Expenses: Not hard at all  Food Insecurity: No Food Insecurity (04/18/2021)   Received from [facility], [facility]   Hunger Vital Sign    Worried About Running Out of Food in the Last Year: Never true    Ran Out of Food in the Last Year: Never true  Transportation Needs: No Transportation Needs (04/18/2021)   Received from [facility], [facility]   PRAPARE -  Transportation    Lack of Transportation (Medical): No    Lack of Transportation (Non-Medical): No  Physical Activity: Sufficiently Active (04/18/2021)   Received from [facility], [facility]   Exercise Vital Sign    Days of Exercise per Week: 4 days    Minutes of Exercise per Session: 50 min  Stress: Stress Concern Present (04/18/2021)   Received from  [facility], [facility]   [employer] of Occupational Health - Occupational Stress Questionnaire    Feeling of Stress : Rather much  Social Connections: Unknown (12/01/2021)   Received from [facility], [facility]   Social Network    Social Network: Not on file  Intimate Partner Violence: Unknown (10/28/2021)   Received from [facility], [facility]   HITS    Physically Hurt: Not on file    Insult or Talk Down To: Not on file    Threaten Physical Harm: Not on file    Scream or Curse: Not on file    Family History  Problem Relation Age of Onset   Alzheimer's disease Mother     Past Medical History:  Diagnosis Date   Aortic regurgitation    mild to moderate AR 05/29/16 echo [facility])   Arthritis    Dyspnea    with exercise only   Enlarged prostate    GERD (gastroesophageal reflux disease)    History of kidney stones    Hyperlipidemia    Kidney stones    Liver cyst    Wrist pain, right     Past Surgical History:  Procedure Laterality Date   ANTERIOR CRUCIATE LIGAMENT REPAIR     ANTERIOR LAT LUMBAR FUSION Right 01/08/2018   Procedure: ANTERIOR LATERAL INTERBODY FUSION LUMBAR THREE- LUMBAR FOUR, LUMBAR FOUR- LUMBAR FIVE;  Surgeon: Augusto Blonder, MD;  Location: [facility] OR;  Service: Neurosurgery;  Laterality: Right;  LUMBAR INTERBODY FUSION   APPLICATION OF ROBOTIC ASSISTANCE FOR SPINAL PROCEDURE N/A 01/08/2018   Procedure: APPLICATION OF ROBOTIC ASSISTANCE FOR SPINAL PROCEDURE;  Surgeon: Augusto Blonder, MD;  Location: [facility] OR;  Service: Neurosurgery;  Laterality: N/A;  APPLICATION OF ROBOTIC  ASSISTANCE FOR SPINAL PROCEDURE   CYSTOSCOPY/RETROGRADE/URETEROSCOPY  02/13/2008   basketing of fragments   EXTRACORPOREAL SHOCK WAVE LITHOTRIPSY Left 03/19/2017   Procedure: LEFT EXTRACORPOREAL SHOCK WAVE LITHOTRIPSY (ESWL);  Surgeon: Andrez Banker, MD;  Location: [facility];  Service: Urology;  Laterality: Left;   KNEE ARTHROSCOPY WITH MEDIAL MENISECTOMY Left 08/18/2016   Procedure: LEFT KNEE ARTHROSCOPY, CHONDROPLASTY AND PARTIAL MEDIAL AND LATERAL MENISCECTOMY;  Surgeon: Saundra Curl, MD;  Location: [facility];  Service: Orthopedics;  Laterality: Left;   KNEE SURGERY Right    LUMBAR PERCUTANEOUS PEDICLE SCREW 3 LEVEL N/A 01/08/2018   Procedure: LUMBAR PERCUTANEOUS PEDICLE SCREW PLACEMENT LUMBAR THREE-FOUR, LUMBAR FOUR-FIVE, LUMBAR FIVE-SACRAL ONE;  Surgeon: Augusto Blonder, MD;  Location: [facility];  Service: Neurosurgery;  Laterality: N/A;   MRI     nuclear stress test     TONSILLECTOMY     TRANSFORAMINAL LUMBAR INTERBODY FUSION (TLIF) WITH PEDICLE SCREW FIXATION 1 LEVEL N/A 01/08/2018   Procedure: TRANSFORAMINAL LUMBAR INTERBODY FUSION LUMBAR FIVE-SACRAL ONE;  Surgeon: Augusto Blonder, MD;  Location: [facility];  Service: Neurosurgery;  Laterality: N/A;   WRIST ARTHROSCOPY WITH ULNA SHORTENING Right 07/06/2015   Procedure: RIGHT WRIST WITH  ARTHROSCOPIC DEBRIDEMENT AND ULNAR SHORTENING OSTEOTOMY;  Surgeon: Rober Chimera, MD;  Location: [facility];  Service: Orthopedics;  Laterality: Right;    Current Outpatient Medications  Medication Sig Dispense Refill   albuterol  (VENTOLIN  HFA) 108 (90 Base) MCG/ACT inhaler Inhale 2 puffs into the lungs every 6 (six) hours as needed for wheezing or shortness of breath.     aspirin  EC 81 MG tablet Take 1 tablet (81 mg total) by mouth daily. 30 tablet 0   benzonatate (TESSALON) 100 MG capsule Take 100 mg by mouth 3 (three)  times daily as needed.     ezetimibe  (ZETIA ) 10 MG tablet Take 1 tablet (10 mg total) by mouth daily.  15 tablet 0   fluticasone  (FLONASE ) 50 MCG/ACT nasal spray Place 1 spray into both nostrils daily. 18.2 mL 2   gabapentin  (NEURONTIN ) 300 MG capsule Take 300 mg by mouth at bedtime.   1   HYDROcodone -acetaminophen  (NORCO/VICODIN) 5-325 MG tablet Take 1 tablet by mouth every 6 (six) hours as needed.     ibuprofen (ADVIL,MOTRIN) 100 MG tablet Take 200 mg by mouth every 6 (six) hours as needed for pain or fever.     methocarbamol  (ROBAXIN ) 500 MG tablet Take 1 tablet (500 mg total) by mouth every 6 (six) hours as needed for muscle spasms. 90 tablet 2   omeprazole (PRILOSEC) 20 MG capsule Take 20 mg by mouth daily.     rosuvastatin  (CRESTOR ) 40 MG tablet Take 1 tablet (40 mg total) by mouth daily. Please call [phone] to schedule an overdue appointment for future refills. Thank you. 2nd attempt. 15 tablet 0   tamsulosin (FLOMAX) 0.4 MG CAPS capsule Take 0.4 mg by mouth daily.  3   WIXELA INHUB 250-50 MCG/ACT AEPB INHALE 1 PUFF INTO THE LUNGS IN THE MORNING AND AT BEDTIME. 60 each 5   Glucosamine-Chondroit-Vit C-Mn (GLUCOSAMINE 1500 COMPLEX PO) Take 1 tablet by mouth daily. (Patient not taking: Reported on 11/19/2023)     Multiple Vitamin (MULTIVITAMIN WITH MINERALS) TABS tablet Take 1 tablet by mouth daily. (Patient not taking: Reported on 11/19/2023)     sildenafil (REVATIO) 20 MG tablet Take 20 mg by mouth 3 (three) times daily. (Patient not taking: Reported on 11/19/2023)     Tiotropium Bromide Monohydrate  (SPIRIVA  RESPIMAT) 2.5 MCG/ACT AERS Inhale 2 puffs into the lungs daily. (Patient not taking: Reported on 11/19/2023) 1 g 5   zolpidem  (AMBIEN  CR) 6.25 MG CR tablet Take 6.25 mg by mouth at bedtime as needed for sleep.  (Patient not taking: Reported on 11/19/2023)  0   No current facility-administered medications for this visit.    Allergies as of 11/19/2023 - Review Complete 11/19/2023  Allergen Reaction Noted   Diphenhydramine  hcl  07/30/2020   Sudafed [pseudoephedrine] Anxiety and Other (See  Comments) 03/19/2017    Vitals: BP 138/88 (BP Location: Right Arm, Cuff Size: Normal)   Pulse 70   Ht 5\' 10"  (1.778 m)   Wt 198 lb 12.8 oz (90.2 kg)   BMI 28.52 kg/m  Last Weight:  Wt Readings from Last 1 Encounters:  11/19/23 198 lb 12.8 oz (90.2 kg)   Last Height:   Ht Readings from Last 1 Encounters:  11/19/23 5\' 10"  (1.778 m)   Last BMI: 28.5  Physical exam:  General: The patient is awake, alert and appears not in acute distress.  The patient is well groomed. Head: Normocephalic, atraumatic.  Neck is supple.   Neck circumference:17 " Cardiovascular:  Regular rate and palpable peripheral pulse:  Respiratory: clear to auscultation.  Mallampati 2, Skin:  Without evidence of edema, or rash Trunk: BMI is 28.5   and patient  has normal posture.   Neurologic exam : The patient is awake and alert, oriented to place and time.  Memory subjective  described as impaired  There is a normal attention span & concentration ability.  Speech is fluent without dysarthria, dysphonia or aphasia.  Mood and affect are appropriate.  Cranial nerves: Pupils are equal and briskly reactive to light. Funduscopic exam without  evidence of pallor or  edema. Extraocular movements  in vertical and horizontal planes intact and without nystagmus. Visual fields by finger perimetry are intact. Hearing to finger rub intact.  Facial sensation intact to fine touch. Facial motor strength is symmetric and tongue and uvula move midline.  Motor exam:  Tone diffusely elevated but hx of shoulder injury and Cervical DDD , and normal muscle bulk and symmetric normal strength in all extremities. Grip Strength is strong!  Proximal strength of shoulder muscles  was  reduced. and hip flexors were strong. .  Sensory:  Fine touch and vibration were tested . Proprioception was tested in the upper extremities only and was intact  normal.  Coordination: Rapid alternating movements in the fingers/hands were normal.   Finger-to-nose maneuver was tested and showed no evidence of ataxia, dysmetria or tremor.  Gait and station: Patient walked without assistive device - Core Strength within normal limits. Stance is stable and of normal base. Tandem gait is affected , limp with left leg , turns outwards    Deep tendon reflexes: in the  upper extremities are symmetric and  attenuated.   Right patella brisk without Clonus. Left patella  not responsive.  Babinski maneuver response is  downgoing.   Assessment: Total time for face to face interview and examination, for review of  images and laboratory testing, neurophysiology testing and pre-existing records, including out-of -network , was 65 minutes. Assessment is as follows here:  The patient was enrolled into a NIH study for patients with a family history of dementia and the effects of daily exercise.   1)   Mild cognitive impairment by MMSE, but based on educational back ground this Billy Erickson patient needs at least a MOCA test. No impairment of ADLs.  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  3)  suspect a depression component to the tests outcome.    Plan:  Treatment plan and additional workup planned after today includes:   1)  Dr. Alexa Andrews will need to undergo  ATN and Alzheimer's genetic risk factor testing.  2)   GNA dementia battery ordered.  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  I like for the patient to resume exercise.   Dependent on ATN and amyloid outcome , will decide on further PET scan type. In any case , I will order Aricept at 5 mg daily.  Plan to increase to 10 mg in follow up visit. .   Attending PHYSICIAN:  Neomia Banner, MD  [facility] and [facility] Sleep Board certified by The [employer] of Sleep Medicine and Diplomate of the [employer] of Sleep Medicine. Board certified In Neurology through the ABPN, Fellow of the [employer] of  Neurology.

## 2023-11-23 ENCOUNTER — Telehealth: Payer: Self-pay

## 2023-11-23 ENCOUNTER — Encounter: Payer: Self-pay | Admitting: Neurology

## 2023-11-23 ENCOUNTER — Other Ambulatory Visit: Payer: Self-pay | Admitting: Neurology

## 2023-11-23 DIAGNOSIS — R0609 Other forms of dyspnea: Secondary | ICD-10-CM

## 2023-11-23 DIAGNOSIS — G3184 Mild cognitive impairment, so stated: Secondary | ICD-10-CM

## 2023-11-23 DIAGNOSIS — I2510 Atherosclerotic heart disease of native coronary artery without angina pectoris: Secondary | ICD-10-CM

## 2023-11-23 DIAGNOSIS — M47816 Spondylosis without myelopathy or radiculopathy, lumbar region: Secondary | ICD-10-CM | POA: Diagnosis not present

## 2023-11-23 NOTE — Telephone Encounter (Signed)
 Spoke w/Pt inquiring if questions pertaining to AD genetic test results posted by MD in [facility]. Pt stated he sort of had an understanding. Discussed the results for understanding and that MD had ordered the metabolic PET scan and he should be receiving a call about scheduling. Pt stated understanding. Pt had questions regarding MD plan noted on AVS at his last visit. MD noted GNA dementia battery ordered. Informed Pt just under that stated the labs looking at metabolic disorder, inflammatory conditions and immune reactions as well as vitamin.  Pt also inquired about his new medication donepezil  that MD wanted him to start 5mg  dosage and discuss increasing it to 10mg  at next visit which is 03/20/24. Pt states he only has 90 days of medication so there will be a one month gap of no medication before his visit, what is the plan? Informed Pt will get an answer for him and get back with him. Pt stated understanding and thankful for the follow up call.

## 2023-11-23 NOTE — Telephone Encounter (Signed)
-----   Message from Wakefield Dohmeier sent at 11/23/2023 11:02 AM EDT ----- Biomarker panel is not positive for Alzheimer's pathology, neither is there a genetic risk factor present.  Only Phosphorylated tau protein was elevated, not the AD specific amyloid ratio- indicating an unspecific Tauopathy.   A PET amyloid scan would not be the next step, but a metabolic PET scan will be ordered.

## 2023-12-04 DIAGNOSIS — M79644 Pain in right finger(s): Secondary | ICD-10-CM | POA: Diagnosis not present

## 2023-12-04 DIAGNOSIS — M79645 Pain in left finger(s): Secondary | ICD-10-CM | POA: Diagnosis not present

## 2023-12-05 DIAGNOSIS — M533 Sacrococcygeal disorders, not elsewhere classified: Secondary | ICD-10-CM | POA: Diagnosis not present

## 2023-12-10 ENCOUNTER — Telehealth: Payer: Self-pay | Admitting: Neurology

## 2023-12-10 ENCOUNTER — Other Ambulatory Visit: Payer: Self-pay | Admitting: Cardiovascular Disease

## 2023-12-10 NOTE — Telephone Encounter (Signed)
 [facility] auth Tracking #[id number] exp. 12/04/23-02/02/24 sent to [facility] [phone]

## 2023-12-12 LAB — ATN PROFILE
A -- Beta-amyloid 42/40 Ratio: 0.119 (ref >0.102)
Beta-amyloid 40: 225.32 pg/mL
Beta-amyloid 42: 26.81 pg/mL
N -- NfL, Plasma: 3.55 pg/mL (ref 0.00–7.64)
T -- p-tau181: 1.26 pg/mL — ABNORMAL HIGH (ref 0.00–0.97)

## 2023-12-12 LAB — CBC WITH DIFFERENTIAL/PLATELET
Basophils Absolute: 0 10*3/uL (ref 0.0–0.2)
Basos: 1 %
EOS (ABSOLUTE): 0.1 10*3/uL (ref 0.0–0.4)
Eos: 2 %
Hematocrit: 46.5 % (ref 37.5–51.0)
Hemoglobin: 15.8 g/dL (ref 13.0–17.7)
Immature Grans (Abs): 0 10*3/uL (ref 0.0–0.1)
Immature Granulocytes: 0 %
Lymphocytes Absolute: 1 10*3/uL (ref 0.7–3.1)
Lymphs: 17 %
MCH: 31.4 pg (ref 26.6–33.0)
MCHC: 34 g/dL (ref 31.5–35.7)
MCV: 92 fL (ref 79–97)
Monocytes Absolute: 0.6 10*3/uL (ref 0.1–0.9)
Monocytes: 10 %
Neutrophils Absolute: 3.9 10*3/uL (ref 1.4–7.0)
Neutrophils: 70 %
Platelets: 195 10*3/uL (ref 150–450)
RBC: 5.03 x10E6/uL (ref 4.14–5.80)
RDW: 12.8 % (ref 11.6–15.4)
WBC: 5.6 10*3/uL (ref 3.4–10.8)

## 2023-12-12 LAB — APOE ALZHEIMER'S RISK

## 2023-12-12 LAB — EARLY ONSET ALZHEIMER'S PANEL

## 2023-12-12 LAB — PROTEIN ELECTROPHORESIS, SERUM
A/G Ratio: 1.3 (ref 0.7–1.7)
Albumin ELP: 4.1 g/dL (ref 2.9–4.4)
Alpha 1: 0.3 g/dL (ref 0.0–0.4)
Alpha 2: 0.7 g/dL (ref 0.4–1.0)
Beta: 1.1 g/dL (ref 0.7–1.3)
Gamma Globulin: 0.9 g/dL (ref 0.4–1.8)
Globulin, Total: 3.1 g/dL (ref 2.2–3.9)
Total Protein: 7.2 g/dL (ref 6.0–8.5)

## 2023-12-12 LAB — ANA W/REFLEX: Anti Nuclear Antibody (ANA): NEGATIVE

## 2023-12-12 LAB — HIV ANTIBODY (ROUTINE TESTING W REFLEX): HIV Screen 4th Generation wRfx: NONREACTIVE

## 2023-12-12 LAB — TSH+FREE T4
Free T4: 1.12 ng/dL (ref 0.82–1.77)
TSH: 0.926 u[IU]/mL (ref 0.450–4.500)

## 2023-12-12 LAB — HEMOGLOBIN A1C
Est. average glucose Bld gHb Est-mCnc: 126 mg/dL
Hgb A1c MFr Bld: 6 % — ABNORMAL HIGH (ref 4.8–5.6)

## 2023-12-12 LAB — SEDIMENTATION RATE: Sed Rate: 4 mm/h (ref 0–30)

## 2023-12-12 LAB — RPR: RPR Ser Ql: NONREACTIVE

## 2023-12-13 DIAGNOSIS — M47816 Spondylosis without myelopathy or radiculopathy, lumbar region: Secondary | ICD-10-CM | POA: Diagnosis not present

## 2023-12-18 ENCOUNTER — Encounter: Payer: Self-pay | Admitting: Neurology

## 2023-12-19 ENCOUNTER — Other Ambulatory Visit: Payer: Self-pay | Admitting: Neurology

## 2023-12-19 DIAGNOSIS — M47812 Spondylosis without myelopathy or radiculopathy, cervical region: Secondary | ICD-10-CM | POA: Diagnosis not present

## 2023-12-19 DIAGNOSIS — Z981 Arthrodesis status: Secondary | ICD-10-CM | POA: Diagnosis not present

## 2023-12-19 DIAGNOSIS — M533 Sacrococcygeal disorders, not elsewhere classified: Secondary | ICD-10-CM | POA: Diagnosis not present

## 2023-12-19 DIAGNOSIS — M545 Low back pain, unspecified: Secondary | ICD-10-CM | POA: Diagnosis not present

## 2023-12-19 DIAGNOSIS — M542 Cervicalgia: Secondary | ICD-10-CM | POA: Diagnosis not present

## 2023-12-19 DIAGNOSIS — G8929 Other chronic pain: Secondary | ICD-10-CM | POA: Diagnosis not present

## 2023-12-19 MED ORDER — MEMANTINE HCL 28 X 5 MG & 21 X 10 MG PO TABS: ORAL_TABLET | ORAL | 12 refills | Status: DC

## 2023-12-19 NOTE — Progress Notes (Signed)
 Namenda starter pack prescribed. Once the final goal of 10 mg bid is reached, please contact us  for refills.

## 2023-12-21 ENCOUNTER — Ambulatory Visit: Payer: Self-pay | Admitting: Neurology

## 2023-12-21 ENCOUNTER — Encounter (HOSPITAL_COMMUNITY)
Admission: RE | Admit: 2023-12-21 | Discharge: 2023-12-21 | Disposition: A | Discharge status: Home / Self Care | Source: Ambulatory Visit | Attending: Neurology | Admitting: Neurology

## 2023-12-21 DIAGNOSIS — I2510 Atherosclerotic heart disease of native coronary artery without angina pectoris: Secondary | ICD-10-CM | POA: Insufficient documentation

## 2023-12-21 DIAGNOSIS — G3184 Mild cognitive impairment, so stated: Secondary | ICD-10-CM | POA: Diagnosis not present

## 2023-12-21 DIAGNOSIS — R0609 Other forms of dyspnea: Secondary | ICD-10-CM | POA: Diagnosis not present

## 2023-12-21 LAB — GLUCOSE, CAPILLARY: Glucose-Capillary: 100 mg/dL — ABNORMAL HIGH (ref 70–99)

## 2023-12-21 MED ORDER — FLUDEOXYGLUCOSE F - 18 (FDG) INJECTION
10.0000 | Freq: Once | INTRAVENOUS | Status: AC | PRN
  Administered 2023-12-21: 9.99 via INTRAVENOUS

## 2023-12-22 ENCOUNTER — Other Ambulatory Visit: Payer: Self-pay | Admitting: Cardiovascular Disease

## 2024-01-01 DIAGNOSIS — M47816 Spondylosis without myelopathy or radiculopathy, lumbar region: Secondary | ICD-10-CM | POA: Diagnosis not present

## 2024-01-02 DIAGNOSIS — M180 Bilateral primary osteoarthritis of first carpometacarpal joints: Secondary | ICD-10-CM | POA: Diagnosis not present

## 2024-01-02 DIAGNOSIS — M1812 Unilateral primary osteoarthritis of first carpometacarpal joint, left hand: Secondary | ICD-10-CM | POA: Diagnosis not present

## 2024-01-02 DIAGNOSIS — M1811 Unilateral primary osteoarthritis of first carpometacarpal joint, right hand: Secondary | ICD-10-CM | POA: Diagnosis not present

## 2024-01-03 ENCOUNTER — Other Ambulatory Visit: Payer: Self-pay | Admitting: Neurology

## 2024-01-03 MED ORDER — MEMANTINE HCL 10 MG PO TABS: 10.0000 mg | ORAL_TABLET | Freq: Two times a day (BID) | ORAL | 5 refills | Status: DC

## 2024-01-06 ENCOUNTER — Other Ambulatory Visit: Payer: Self-pay | Admitting: Cardiovascular Disease

## 2024-01-15 ENCOUNTER — Other Ambulatory Visit: Payer: Self-pay | Admitting: Cardiovascular Disease

## 2024-01-15 DIAGNOSIS — M47816 Spondylosis without myelopathy or radiculopathy, lumbar region: Secondary | ICD-10-CM | POA: Diagnosis not present

## 2024-01-29 DIAGNOSIS — R351 Nocturia: Secondary | ICD-10-CM | POA: Diagnosis not present

## 2024-01-29 DIAGNOSIS — N200 Calculus of kidney: Secondary | ICD-10-CM | POA: Diagnosis not present

## 2024-01-29 DIAGNOSIS — N5201 Erectile dysfunction due to arterial insufficiency: Secondary | ICD-10-CM | POA: Diagnosis not present

## 2024-01-29 DIAGNOSIS — N401 Enlarged prostate with lower urinary tract symptoms: Secondary | ICD-10-CM | POA: Diagnosis not present

## 2024-02-10 ENCOUNTER — Other Ambulatory Visit: Payer: Self-pay | Admitting: Pulmonary Disease

## 2024-02-10 DIAGNOSIS — J4520 Mild intermittent asthma, uncomplicated: Secondary | ICD-10-CM

## 2024-02-11 ENCOUNTER — Other Ambulatory Visit: Payer: Self-pay

## 2024-02-11 MED ORDER — ROSUVASTATIN CALCIUM 40 MG PO TABS: 40.0000 mg | ORAL_TABLET | Freq: Every day | ORAL | 0 refills | Status: DC

## 2024-02-13 ENCOUNTER — Other Ambulatory Visit: Payer: Self-pay | Admitting: Neurology

## 2024-02-18 DIAGNOSIS — H524 Presbyopia: Secondary | ICD-10-CM | POA: Diagnosis not present

## 2024-02-18 DIAGNOSIS — H18513 Endothelial corneal dystrophy, bilateral: Secondary | ICD-10-CM | POA: Diagnosis not present

## 2024-02-18 DIAGNOSIS — H5213 Myopia, bilateral: Secondary | ICD-10-CM | POA: Diagnosis not present

## 2024-02-18 DIAGNOSIS — H52223 Regular astigmatism, bilateral: Secondary | ICD-10-CM | POA: Diagnosis not present

## 2024-02-20 DIAGNOSIS — Z0000 Encounter for general adult medical examination without abnormal findings: Secondary | ICD-10-CM | POA: Diagnosis not present

## 2024-02-20 DIAGNOSIS — Z6828 Body mass index (BMI) 28.0-28.9, adult: Secondary | ICD-10-CM | POA: Diagnosis not present

## 2024-02-20 DIAGNOSIS — J4520 Mild intermittent asthma, uncomplicated: Secondary | ICD-10-CM | POA: Diagnosis not present

## 2024-02-20 DIAGNOSIS — R29818 Other symptoms and signs involving the nervous system: Secondary | ICD-10-CM | POA: Diagnosis not present

## 2024-02-20 DIAGNOSIS — R5383 Other fatigue: Secondary | ICD-10-CM | POA: Diagnosis not present

## 2024-02-20 DIAGNOSIS — R0602 Shortness of breath: Secondary | ICD-10-CM | POA: Diagnosis not present

## 2024-02-20 DIAGNOSIS — Z1331 Encounter for screening for depression: Secondary | ICD-10-CM | POA: Diagnosis not present

## 2024-02-20 DIAGNOSIS — E785 Hyperlipidemia, unspecified: Secondary | ICD-10-CM | POA: Diagnosis not present

## 2024-02-22 ENCOUNTER — Encounter (HOSPITAL_BASED_OUTPATIENT_CLINIC_OR_DEPARTMENT_OTHER): Payer: Self-pay

## 2024-02-23 NOTE — Progress Notes (Deleted)
  Cardiology Office Note   Date:  02/23/2024  ID:  Billy Erickson, DOB 26-Jun-1946, MRN [id number] PCP: Dayna Motto, DO  [facility] Providers Cardiologist:  Aleene Passe, MD (Inactive) { Click to update primary MD,subspecialty MD or APP then REFRESH:1}    PMH Hyperlipidemia Aortic valve insufficiency Nonobstructive CAD Aortic atherosclerosis  Referred to cardiology and seen by Dr. Passe during telemedicine visit April 2020 for evaluation of shortness of breath.  He was started on albuterol  inhaler and scheduled for an echocardiogram and stress test.  Echo 12/26/2018 revealed normal left ventricular function, mild aortic insufficiency.  Coronary CTA revealed coronary calcium  score of 168 Agatston units (50th percentile), mild mixed plaque in LAD, LCX mild mixed plaque with no significant stenosis, RCA no stenosis.  Seen by Dr. Passe in clinic 06/28/2021.  He continued to have shortness of breath and lots of neuropathic pain.  He was advised to increase rosuvastatin  for LDL goal < 70.  Seen by me on 06/27/2022.  He reported episodes of shortness of breath so severe at times that he has to lay down.  Episodes occurring frequently, but not daily, and at various times of the day.  1 episode occurred while walking up the steps and he had to lay down at the top for 5 minutes or so.  No syncope.  Exercise includes walking the dog, gardening, and exercising on the elliptical or treadmill a few days a week.  He notes when there is a gradual increase in heart rate symptoms are not as severe.  Sudden increase in heart rate is associated with more fatigue and shortness of breath.  He gets lightheaded and clammy, no n/v, no palpitations.  Not monitoring HR/BP at home.  Chronic back pain.  Admits that hydration is poor.  LDL was 87 on 09/26/2021 on rosuvastatin  and ezetimibe .  Cardiac monitor completed 07/18/2022 revealed predominantly normal sinus rhythm, rare episodes of nonsustained ventricular  tachycardia, only 4-6 beats, no serious arrhythmias to explain episodes of near syncope.  TTE 07/21/2022 revealed hyperdynamic LV function 70 to 75%, G1 DD, normal RV, mild AI, no significant changes from previous echo 12/2018.  He underwent CPX on 11/10/2022 which revealed excellent gas exchange.  He had a 12% reduction in FEV1 post exercise suggestive of exercise-induced bronchospasm or reactive airway with exertion.  He was advised to follow-up with pulmonology.  History of Present Illness Billy Erickson is a 78 y.o. male ***  ROS: ***  Studies Reviewed      ***  No results found for: LIPOA  Risk Assessment/Calculations {Does this patient have ATRIAL FIBRILLATION?[phone]} No BP recorded.  {Refresh Note OR Click here to enter BP  :1}***       Physical Exam VS:  There were no vitals taken for this visit.   Wt Readings from Last 3 Encounters:  11/19/23 198 lb 12.8 oz (90.2 kg)  11/21/22 198 lb (89.8 kg)  09/25/22 201 lb 12.8 oz (91.5 kg)    GEN: Well nourished, well developed in no acute distress NECK: No JVD; No carotid bruits CARDIAC: ***RRR, no murmurs, rubs, gallops RESPIRATORY:  Clear to auscultation without rales, wheezing or rhonchi  ABDOMEN: Soft, non-tender, non-distended EXTREMITIES:  No edema; No deformity   ASSESSMENT AND PLAN ***    {Are you ordering a CV Procedure (e.g. stress test, cath, DCCV, TEE, etc)?   Press F2        :[id number]}  Dispo: ***  Signed, Rosaline Bane, NP-C

## 2024-02-25 ENCOUNTER — Other Ambulatory Visit (HOSPITAL_BASED_OUTPATIENT_CLINIC_OR_DEPARTMENT_OTHER): Payer: Self-pay | Admitting: *Deleted

## 2024-02-25 ENCOUNTER — Inpatient Hospital Stay (HOSPITAL_COMMUNITY)

## 2024-02-25 ENCOUNTER — Encounter (HOSPITAL_COMMUNITY)
Admission: RE | Disposition: A | Discharge status: Home / Self Care | Payer: Self-pay | Source: Ambulatory Visit | Attending: Urology

## 2024-02-25 ENCOUNTER — Encounter (HOSPITAL_BASED_OUTPATIENT_CLINIC_OR_DEPARTMENT_OTHER): Payer: Self-pay

## 2024-02-25 ENCOUNTER — Ambulatory Visit (HOSPITAL_COMMUNITY)
Admission: RE | Admit: 2024-02-25 | Discharge: 2024-02-25 | Disposition: A | Discharge status: Home / Self Care | Source: Ambulatory Visit | Attending: Urology | Admitting: Urology

## 2024-02-25 ENCOUNTER — Inpatient Hospital Stay (HOSPITAL_BASED_OUTPATIENT_CLINIC_OR_DEPARTMENT_OTHER)

## 2024-02-25 ENCOUNTER — Ambulatory Visit (HOSPITAL_BASED_OUTPATIENT_CLINIC_OR_DEPARTMENT_OTHER): Admitting: Nurse Practitioner

## 2024-02-25 ENCOUNTER — Other Ambulatory Visit: Payer: Self-pay

## 2024-02-25 ENCOUNTER — Encounter (HOSPITAL_COMMUNITY): Payer: Self-pay | Admitting: Urology

## 2024-02-25 ENCOUNTER — Other Ambulatory Visit: Payer: Self-pay | Admitting: Urology

## 2024-02-25 DIAGNOSIS — G709 Myoneural disorder, unspecified: Secondary | ICD-10-CM | POA: Insufficient documentation

## 2024-02-25 DIAGNOSIS — N201 Calculus of ureter: Secondary | ICD-10-CM

## 2024-02-25 DIAGNOSIS — N134 Hydroureter: Secondary | ICD-10-CM | POA: Diagnosis not present

## 2024-02-25 DIAGNOSIS — E785 Hyperlipidemia, unspecified: Secondary | ICD-10-CM | POA: Diagnosis not present

## 2024-02-25 DIAGNOSIS — I2510 Atherosclerotic heart disease of native coronary artery without angina pectoris: Secondary | ICD-10-CM

## 2024-02-25 DIAGNOSIS — K219 Gastro-esophageal reflux disease without esophagitis: Secondary | ICD-10-CM | POA: Insufficient documentation

## 2024-02-25 DIAGNOSIS — Z981 Arthrodesis status: Secondary | ICD-10-CM | POA: Diagnosis not present

## 2024-02-25 DIAGNOSIS — K7689 Other specified diseases of liver: Secondary | ICD-10-CM | POA: Diagnosis not present

## 2024-02-25 DIAGNOSIS — M1990 Unspecified osteoarthritis, unspecified site: Secondary | ICD-10-CM | POA: Diagnosis not present

## 2024-02-25 DIAGNOSIS — I517 Cardiomegaly: Secondary | ICD-10-CM | POA: Insufficient documentation

## 2024-02-25 DIAGNOSIS — N35919 Unspecified urethral stricture, male, unspecified site: Secondary | ICD-10-CM | POA: Insufficient documentation

## 2024-02-25 DIAGNOSIS — N132 Hydronephrosis with renal and ureteral calculous obstruction: Secondary | ICD-10-CM | POA: Diagnosis not present

## 2024-02-25 DIAGNOSIS — I358 Other nonrheumatic aortic valve disorders: Secondary | ICD-10-CM | POA: Diagnosis not present

## 2024-02-25 DIAGNOSIS — I351 Nonrheumatic aortic (valve) insufficiency: Secondary | ICD-10-CM | POA: Diagnosis not present

## 2024-02-25 HISTORY — PX: CYSTOSCOPY/URETEROSCOPY/HOLMIUM LASER/STENT PLACEMENT: SHX6546

## 2024-02-25 LAB — BASIC METABOLIC PANEL WITH GFR
Anion gap: 10 (ref 5–15)
BUN: 21 mg/dL (ref 8–23)
CO2: 25 mmol/L (ref 22–32)
Calcium: 9.5 mg/dL (ref 8.9–10.3)
Chloride: 102 mmol/L (ref 98–111)
Creatinine, Ser: 1.31 mg/dL — ABNORMAL HIGH (ref 0.61–1.24)
GFR, Estimated: 56 mL/min — ABNORMAL LOW (ref >60)
Glucose, Bld: 121 mg/dL — ABNORMAL HIGH (ref 70–99)
Potassium: 4.5 mmol/L (ref 3.5–5.1)
Sodium: 137 mmol/L (ref 135–145)

## 2024-02-25 LAB — CBC
HCT: 43.2 % (ref 39.0–52.0)
Hemoglobin: 14.2 g/dL (ref 13.0–17.0)
MCH: 31.1 pg (ref 26.0–34.0)
MCHC: 32.9 g/dL (ref 30.0–36.0)
MCV: 94.7 fL (ref 80.0–100.0)
Platelets: 141 K/uL — ABNORMAL LOW (ref 150–400)
RBC: 4.56 MIL/uL (ref 4.22–5.81)
RDW: 12.2 % (ref 11.5–15.5)
WBC: 7.5 K/uL (ref 4.0–10.5)
nRBC: 0 % (ref 0.0–0.2)

## 2024-02-25 SURGERY — CYSTOSCOPY/URETEROSCOPY/HOLMIUM LASER/STENT PLACEMENT
Anesthesia: General | Laterality: Right

## 2024-02-25 MED ORDER — LACTATED RINGERS IV SOLN: INTRAVENOUS | Status: DC

## 2024-02-25 MED ORDER — IOHEXOL 300 MG/ML  SOLN
INTRAMUSCULAR | Status: DC | PRN
  Administered 2024-02-25: 5 mL

## 2024-02-25 MED ORDER — ONDANSETRON HCL 4 MG/2ML IJ SOLN
INTRAMUSCULAR | Status: AC
  Filled 2024-02-25: qty 2

## 2024-02-25 MED ORDER — ACETAMINOPHEN 10 MG/ML IV SOLN
INTRAVENOUS | Status: DC | PRN
  Administered 2024-02-25: 1000 mg via INTRAVENOUS

## 2024-02-25 MED ORDER — SODIUM CHLORIDE 0.9 % IR SOLN
Status: DC | PRN
  Administered 2024-02-25: 3000 mL

## 2024-02-25 MED ORDER — EZETIMIBE 10 MG PO TABS: 10.0000 mg | ORAL_TABLET | Freq: Every day | ORAL | 0 refills | Status: DC

## 2024-02-25 MED ORDER — PROPOFOL 10 MG/ML IV BOLUS
INTRAVENOUS | Status: AC
  Filled 2024-02-25: qty 20

## 2024-02-25 MED ORDER — FENTANYL CITRATE (PF) 100 MCG/2ML IJ SOLN
INTRAMUSCULAR | Status: AC
Start: 2024-02-25 — End: 2024-02-25
  Filled 2024-02-25: qty 2

## 2024-02-25 MED ORDER — PHENYLEPHRINE 80 MCG/ML (10ML) SYRINGE FOR IV PUSH (FOR BLOOD PRESSURE SUPPORT)
PREFILLED_SYRINGE | INTRAVENOUS | Status: AC
  Filled 2024-02-25: qty 10

## 2024-02-25 MED ORDER — PHENYLEPHRINE HCL (PRESSORS) 10 MG/ML IV SOLN
INTRAVENOUS | Status: AC
  Filled 2024-02-25: qty 1

## 2024-02-25 MED ORDER — PROPOFOL 1000 MG/100ML IV EMUL
INTRAVENOUS | Status: AC
  Filled 2024-02-25: qty 100

## 2024-02-25 MED ORDER — PROPOFOL 10 MG/ML IV BOLUS
INTRAVENOUS | Status: DC | PRN
  Administered 2024-02-25: 150 mg via INTRAVENOUS

## 2024-02-25 MED ORDER — SODIUM CHLORIDE 0.9% FLUSH: 3.0000 mL | Freq: Two times a day (BID) | INTRAVENOUS | Status: DC

## 2024-02-25 MED ORDER — DEXAMETHASONE SODIUM PHOSPHATE 4 MG/ML IJ SOLN
INTRAMUSCULAR | Status: DC | PRN
  Administered 2024-02-25: 4 mg via INTRAVENOUS

## 2024-02-25 MED ORDER — FENTANYL CITRATE PF 50 MCG/ML IJ SOSY
100.0000 ug | PREFILLED_SYRINGE | Freq: Once | INTRAMUSCULAR | Status: AC
  Administered 2024-02-25: 100 ug via INTRAVENOUS
  Filled 2024-02-25: qty 2

## 2024-02-25 MED ORDER — ROSUVASTATIN CALCIUM 40 MG PO TABS: 40.0000 mg | ORAL_TABLET | Freq: Every day | ORAL | 0 refills | Status: DC

## 2024-02-25 MED ORDER — LIDOCAINE HCL (CARDIAC) PF 100 MG/5ML IV SOSY
PREFILLED_SYRINGE | INTRAVENOUS | Status: DC | PRN
  Administered 2024-02-25: 80 mg via INTRAVENOUS

## 2024-02-25 MED ORDER — CEFAZOLIN SODIUM-DEXTROSE 2-4 GM/100ML-% IV SOLN
2.0000 g | INTRAVENOUS | Status: AC
  Administered 2024-02-25: 2 g via INTRAVENOUS
  Filled 2024-02-25: qty 100

## 2024-02-25 MED ORDER — ONDANSETRON HCL 4 MG/2ML IJ SOLN
INTRAMUSCULAR | Status: DC | PRN
  Administered 2024-02-25: 4 mg via INTRAVENOUS

## 2024-02-25 MED ORDER — HYDROCODONE-ACETAMINOPHEN 5-325 MG PO TABS: 1.0000 | ORAL_TABLET | Freq: Four times a day (QID) | ORAL | 0 refills | Status: DC | PRN

## 2024-02-25 MED ORDER — PHENYLEPHRINE 80 MCG/ML (10ML) SYRINGE FOR IV PUSH (FOR BLOOD PRESSURE SUPPORT)
PREFILLED_SYRINGE | INTRAVENOUS | Status: DC | PRN
  Administered 2024-02-25: 40 ug via INTRAVENOUS
  Administered 2024-02-25 (×2): 80 ug via INTRAVENOUS
  Administered 2024-02-25: 40 ug via INTRAVENOUS
  Administered 2024-02-25: 80 ug via INTRAVENOUS

## 2024-02-25 MED ORDER — DEXAMETHASONE SODIUM PHOSPHATE 10 MG/ML IJ SOLN
INTRAMUSCULAR | Status: AC
  Filled 2024-02-25: qty 1

## 2024-02-25 MED ORDER — PROPOFOL 500 MG/50ML IV EMUL
INTRAVENOUS | Status: DC | PRN
  Administered 2024-02-25: 125 ug/kg/min via INTRAVENOUS

## 2024-02-25 MED ORDER — LIDOCAINE HCL (PF) 2 % IJ SOLN
INTRAMUSCULAR | Status: AC
  Filled 2024-02-25: qty 5

## 2024-02-25 MED ORDER — CHLORHEXIDINE GLUCONATE 0.12 % MT SOLN
15.0000 mL | Freq: Once | OROMUCOSAL | Status: AC
  Administered 2024-02-25: 15 mL via OROMUCOSAL

## 2024-02-25 MED ORDER — ALBUMIN HUMAN 5 % IV SOLN
INTRAVENOUS | Status: AC
  Filled 2024-02-25: qty 250

## 2024-02-25 MED ORDER — ALBUMIN HUMAN 5 % IV SOLN: INTRAVENOUS | Status: DC | PRN

## 2024-02-25 MED ORDER — FENTANYL CITRATE (PF) 100 MCG/2ML IJ SOLN
INTRAMUSCULAR | Status: DC | PRN
  Administered 2024-02-25 (×2): 50 ug via INTRAVENOUS

## 2024-02-25 MED ORDER — ACETAMINOPHEN 10 MG/ML IV SOLN
INTRAVENOUS | Status: AC
  Filled 2024-02-25: qty 100

## 2024-02-25 SURGICAL SUPPLY — 21 items
BAG URO CATCHER STRL LF (MISCELLANEOUS) ×1 IMPLANT
BASKET STONE NCOMPASS (UROLOGICAL SUPPLIES) IMPLANT
CATH URETERAL DUAL LUMEN 10F (MISCELLANEOUS) IMPLANT
CATH URETL OPEN 5X70 (CATHETERS) IMPLANT
CATH URETL OPEN END 6FR 70 (CATHETERS) IMPLANT
CLOTH BEACON ORANGE TIMEOUT ST (SAFETY) ×1 IMPLANT
EXTRACTOR STONE NITINOL NGAGE (UROLOGICAL SUPPLIES) IMPLANT
GLOVE SURG SS PI 8.0 STRL IVOR (GLOVE) ×1 IMPLANT
GOWN STRL SURGICAL XL XLNG (GOWN DISPOSABLE) ×1 IMPLANT
GUIDEWIRE STR DUAL SENSOR (WIRE) ×1 IMPLANT
KIT BALLN UROMAX 15FX4 (MISCELLANEOUS) IMPLANT
KIT TURNOVER KIT A (KITS) ×1 IMPLANT
LASER FIB FLEXIVA PULSE ID 365 (Laser) IMPLANT
LASER FIB FLEXIVA PULSE ID 550 (Laser) IMPLANT
LASER FIB FLEXIVA PULSE ID 910 (Laser) IMPLANT
MANIFOLD NEPTUNE II (INSTRUMENTS) ×1 IMPLANT
PACK CYSTO (CUSTOM PROCEDURE TRAY) ×1 IMPLANT
SHEATH NAVIGATOR HD 11/13X36 (SHEATH) IMPLANT
TRACTIP FLEXIVA PULS ID 200XHI (Laser) IMPLANT
TUBING CONNECTING 10 (TUBING) ×1 IMPLANT
TUBING UROLOGY SET (TUBING) ×1 IMPLANT

## 2024-02-25 NOTE — Telephone Encounter (Signed)
 S/w pt  R/S appointment.  Pt was at [facility] urology. Filled zetia  and rosuvastatin  till next appt.

## 2024-02-25 NOTE — Op Note (Signed)
 Procedure: 1.  Cystoscopy with right retrograde pyelogram and interpretation. 2.  Right ureteroscopy with holmium laser application, stone extraction and insertion of double-J stent. 3.  Application of fluoroscopy.  Preop diagnosis: Right distal ureteral stone.  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  Surgeon: Dr. Norleen Seltzer.  Anesthesia: General.  Specimen: Stone fragments.  Drains: 6 French by 24 cm right Contour double-J stent with tether.  EBL: None.  Complications: None.  Indications: The patient is a 78 year old male with a history of recurrent urolithiasis who presented to the office today with a few day history of severe right flank pain.  Initially KUB demonstrated a possible stone in the right distal ureter but it seemed more medial so a CT scan was done to confirm the presence of the stone.  The stone which had previously been in the right upper pole was indeed in the distal ureter approximately 3 to 4 cm proximal to the meatus.  There was hydronephrosis.  His pain was only partially relieved by morphine  so was felt that ureteroscopy was indicated.  The stone density was about 1300 [place] so I felt ureteroscopy was more appropriate than lithotripsy for this particular stone.  Procedure: He was taken the operating room where a general anesthetic was induced.  He was given Ancef .  He was placed in lithotomy position and fitted with PAS hose.  His perineum and genitalia were prepped with Betadine solution he was draped in usual sterile fashion.  Cystoscopy was performed using the 21 [place] scope and 30 degree lens.  Examination revealed a normal urethra with the exception of a moderate membranous sphincter in the bulb but when I tried to pass the scope through the raised mucosal flap securing the lumen.  I then used a guidewire to locate the true lumen and with the wire in place into the bladder I was able to advance the scope through the diaphanous material  into the prostatic urethra.  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  Examination of bladder revealed mild trabeculation without tumors, stones or inflammation.  Ureteral orifices were unremarkable.  The right ureteral orifice was cannulated with a 6 [place] open-ended catheter and contrast was instilled.  The right retrograde pyelogram demonstrated a normal caliber distal ureter with a filling defect approximately 3 to 4 cm proximally consistent with a stone with dilation proximal to that.  A sensor wire was then advanced through the open-ended catheter up the ureter to the kidney by the stone without difficulty.  I then used a 15 [place] by 4 cm high-pressure balloon dilated to 20 atm across the intramural ureter to dilate the distal ureter.  The balloon and cystoscope were then removed and a 6.5 French semirigid ureteroscope was advanced alongside the wire into the distal ureter where the stone was visualized.  The stone was then fragmented using a 365 m laser fiber with the laser set on the dusting setting.  The stone fragmented readily using the left pedal with 0.3 J and 63 Hz.  Once the stone was adequately fragmented, the fragments were removed to the bladder using an engage basket.  Once all fragments had been removed by both visual and fluoroscopic inspection, I removed the ureteroscope and replaced the cystoscope alongside the wire.  The stone fragments were then evacuated from the bladder.  The cystoscope was then reinserted over the wire and a 6 [place] by 24 cm contour double-J stent with tether was passed to the  kidney under fluoroscopic guidance.  The wire was removed, leaving good coil in the kidney and a good coil in the bladder.  The bladder was drained and the cystoscope was removed leaving the stent string exiting urethra.  The string secured to the patient's penis.  He was taken down from lithotomy position, his anesthetic was  reversed and he was moved recovery room in stable condition.  There were no complications.  The stone fragments were given to the family.

## 2024-02-25 NOTE — Anesthesia Preprocedure Evaluation (Addendum)
 Anesthesia Evaluation  Patient identified by MRN, date of birth, ID band Patient awake    Reviewed: Allergy & Precautions, NPO status , Patient's Chart, lab work & pertinent test results  History of Anesthesia Complications Negative for: history of anesthetic complications  Airway Mallampati: II  TM Distance: >3 FB Neck ROM: Full    Dental  (+) Dental Advisory Given   Pulmonary shortness of breath Cardiopulmonary test 24 Conclusion: Exercise testing with gas exchange demonstrates normal (actually excellent) functional capacity when compared to matched sedentary norms. Patient appears primarily ventilatory limited with depleted breathing reserve and is most likely demonstrating hyperventilation at peak exercise (with high VE/MVV, low PETCO2). However, elevated pulmonary pressures with exercise cannot be excluded. Although it does not meet diagnostic criteria, there was a 12% reduction in FEV1  post-exercise and is suggestive of exercise induced bronchospasm or reactive airway with exertion.    breath sounds clear to auscultation       Cardiovascular + CAD  + Valvular Problems/Murmurs AI  Rhythm:Regular  Echo 23 1. Left ventricular ejection fraction, by estimation, is 70 to 75%. The  left ventricle has hyperdynamic function. The left ventricle has no  regional wall motion abnormalities. There is mild left ventricular  hypertrophy. Left ventricular diastolic  parameters are consistent with Grade I diastolic dysfunction (impaired  relaxation).   2. Right ventricular systolic function is normal. The right ventricular  size is normal.   3. The mitral valve is grossly normal. Trivial mitral valve  regurgitation.   4. The aortic valve is tricuspid. Aortic valve regurgitation is mild.  Aortic valve sclerosis is present, with no evidence of aortic valve  stenosis.   5. The inferior vena cava is normal in size with greater than 50%   respiratory variability, suggesting right atrial pressure of 3 mmHg.     Neuro/Psych  Neuromuscular disease  negative psych ROS   GI/Hepatic ,GERD  Medicated and Controlled,,  Endo/Other  negative endocrine ROS    Renal/GU Renal disease     Musculoskeletal  (+) Arthritis ,    Abdominal   Peds  Hematology negative hematology ROS (+)   Anesthesia Other Findings    Reproductive/Obstetrics                              Anesthesia Physical Anesthesia Plan  ASA: 3  Anesthesia Plan: General   Post-op Pain Management: Tylenol  PO (pre-op)*, Celebrex PO (pre-op)* and Minimal or no pain anticipated   Induction: Intravenous  PONV Risk Score and Plan: 2 and Ondansetron  and Dexamethasone   Airway Management Planned: Oral ETT and LMA  Additional Equipment: None  Intra-op Plan:   Post-operative Plan: Extubation in OR  Informed Consent: I have reviewed the patients History and Physical, chart, labs and discussed the procedure including the risks, benefits and alternatives for the proposed anesthesia with the patient or authorized representative who has indicated his/her understanding and acceptance.     Dental advisory given  Plan Discussed with: CRNA and Anesthesiologist  Anesthesia Plan Comments: ( )         Anesthesia Quick Evaluation                                  Anesthesia Evaluation  Patient identified by MRN, date of birth, ID band Patient awake

## 2024-02-25 NOTE — Discharge Instructions (Addendum)
 You may remove the stent by pulling the attached string on Thursday morning.   IF you don't feel you can do that, please call the office to have it removed.  Bring the stone fragments to the office for analysis at your f/u visit.

## 2024-02-25 NOTE — Anesthesia Procedure Notes (Signed)
 Procedure Name: LMA Insertion Date/Time: 02/25/2024 4:12 PM  Performed by: Landy Chip HERO, CRNAPre-anesthesia Checklist: Patient identified, Emergency Drugs available, Suction available and Patient being monitored Patient Re-evaluated:Patient Re-evaluated prior to induction Oxygen Delivery Method: Circle System Utilized Preoxygenation: Pre-oxygenation with 100% oxygen Induction Type: IV induction Ventilation: Mask ventilation without difficulty LMA: LMA inserted LMA Size: 4.0 Number of attempts: 1 Airway Equipment and Method: Bite block Placement Confirmation: positive ETCO2 Tube secured with: Tape Dental Injury: Teeth and Oropharynx as per pre-operative assessment

## 2024-02-25 NOTE — Transfer of Care (Signed)
 Immediate Anesthesia Transfer of Care Note  Patient: Billy Erickson  Procedure(s) Performed: CYSTOSCOPY/URETEROSCOPY/HOLMIUM LASER/STENT PLACEMENT (Right)  Patient Location: PACU  Anesthesia Type:General  Level of Consciousness: drowsy  Airway & Oxygen Therapy: Patient Spontanous Breathing and Patient connected to nasal cannula oxygen  Post-op Assessment: Report given to RN and Post -op Vital signs reviewed and stable  Post vital signs: Reviewed and stable  Last Vitals:  Vitals Value Taken Time  BP 128/81 02/25/24 17:21  Temp    Pulse 76 02/25/24 17:23  Resp 16 02/25/24 17:23  SpO2 96 % 02/25/24 17:23  Vitals shown include unfiled device data.  Last Pain:  Vitals:   02/25/24 1543  TempSrc:   PainSc: 9       Patients Stated Pain Goal: 4 (02/25/24 1502)  Complications: No notable events documented.

## 2024-02-25 NOTE — Anesthesia Postprocedure Evaluation (Signed)
 Anesthesia Post Note  Patient: Billy Erickson  Procedure(s) Performed: CYSTOSCOPY/URETEROSCOPY/HOLMIUM LASER/STENT PLACEMENT (Right)     Patient location during evaluation: PACU Anesthesia Type: General Level of consciousness: awake and alert Pain management: pain level controlled Vital Signs Assessment: post-procedure vital signs reviewed and stable Respiratory status: spontaneous breathing, nonlabored ventilation, respiratory function stable and patient connected to nasal cannula oxygen Cardiovascular status: blood pressure returned to baseline and stable Postop Assessment: no apparent nausea or vomiting Anesthetic complications: no   No notable events documented.  Last Vitals:  Vitals:   02/25/24 1745 02/25/24 1800  BP: 128/79 134/77  Pulse: 70 69  Resp: 16 18  Temp:  (!) 36.4 C  SpO2: 95% 97%    Last Pain:  Vitals:   02/25/24 1800  TempSrc:   PainSc: 0-No pain                 Thom JONELLE Peoples

## 2024-02-25 NOTE — H&P (Signed)
 Renal calculi   01/2018: He returns today after having passed the 2 ureteral stones on the left side last year. He returns today for routine follow-up. He did complete a 24 hour urine in the fall that demonstrated low urine volume and hypocitraturia. He has made an effort to increase his urine volume with fluid hydration and has increased his dietary citrate. He denies any further symptomatic episodes since the fall. Unfortunately, he did undergo major back surgery with a lumbar fusion recently. He follows up today with a KUB x-ray   05/14/2020: Stable right renal calculi noted on last office visit KUB examination. He has not followed up since then. Presents today for evaluation of possible obstructing kidney stone.   Symptoms began approximately 6 days ago with worsening right lower quadrant abdominal pain. He was evaluated at a local urgent care and prescribed pain medication, instructed to follow up with Urology for possible evaluation of an obstructing calculus. He was prescribed pain medication, he also states being told there was blood in his urine. Since then he has had intermittent but recurrent right lower quadrant abdominal pain that will radiate some toward the groin. Not associated with flank or lower back pain/discomfort. Although initially not felt to be similar to previous stone events, the discomfort he is having now is consistent with prior incidences when he was trying to pass a ureteral calculus. Denies nausea or vomiting. He has not had any visible blood in the urine nor denies any interval stone material passage. Symptoms also not associated with fevers or chills. Since the onset of symptoms he has noted some increased sensation of incomplete emptying and increased urgency. He takes tamsulosin at baseline and endorses good control of lower urinary tract symptoms with that medication. Urinalysis is clear today.   01/25/2022: KUB did not show an obvious ureteral calculi at time of last  office visit. I requested a 2-week follow-up for repeat evaluation but that appointment never occurred. He was last seen in October 2021. Now back today for overdue follow-up exam. Previously on tamsulosin with noted stability of baseline lower urinary tract symptoms while on alpha-blocker therapy. A couple of weeks ago the prescription ran out. He noticed an almost immediate difference in force of stream as well as increased nocturia. Also with increased obstructive symptoms he has developed some pain or discomfort with voiding but denies true dysuria. Symptoms not associated with gross hematuria. 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 Since tamsulosin ran out he has had some exacerbation of symptoms complaining of burning pain and discomfort with radiation down the posterior right leg. He states this does not feel like prior kidney stone events but wanted to make mention of such. Pain is usually worse with lying on the affected side and with activity and position changes but it usually improves as the day progresses. UA today is clear. He denies any interval stone material passage.   01/29/2023: Returns today for annual follow-up exam. He remains on tamsulosin daily which he takes in the morning. Overall voiding symptoms grossly stable over the past year. He still has some variable nocturia which can range anywhere from 0-3 times nightly. Unclear if this is modified by fluid intake prior to bedtime or not. He has had no bothersome changes in daytime frequency, changes in force of stream. Denies any interval dysuria or gross hematuria. Denies any interval stone material passage. He has had no recurrence of unilateral lower back or flank pain/discomfort suggestive of  obstructive uropathy. Due for KUB today. Also needs updated PSA screening.   01/29/2024: Patient returns today for annual follow-up evaluation with KUB. He remains on tamsulosin daily which he now takes in the  evening. Denies any new or worsening lower urinary tract symptoms specifically no bothersome daytime frequency/urgency, changes in force of stream or increase sensation of weekly bladder emptying. He has had no interval dysuria, gross hematuria or interval treatment for UTI. He continues to be bothered by nocturia which is subjectively worsened. He gets up 3-4 times per night. His wife makes note that he often snores and has questionable apnea but has never been evaluated for OSA. Patient also denies any interval stone material passage. He has not had right sided stone burden which remains grossly stable on today's KUB study. He denies any interval correlating unilateral lower back or flank pain/discomfort suggestive of obstructive uropathy. UA today within normal limits. Patient also wanted to discuss erectile dysfunction. He is having increasing difficulty achieving and maintaining his erection. He tried sildenafil before that was effective but it caused shortness of breath so he discontinued it. He is not on any type of nitrate therapy and endorses being in good overall cardiovascular health.   02/25/24: Billy Erickson returns today with the onset over the weekend of severe right flank pain with some urgency. H ehas no hematuria. HE has had some nausea. KUB today demonstrates a possible 7-87mm stone in the right pelvis that seems more medial than it should for a stone. I don't see the RUP area well because of overlying bowel content. The stone was in the RUP on his last KUB. He has a chronic RUQ calcification that on subsequent CT is not in the ureter and may be from his prior right pyeloplasty. there is an obstructing 8mm stone in the right distal ureter. He got some relief with morphine  4mg .     ALLERGIES: Benadryl  - Other Reaction, urinary retention Gabapentin  - Headache Sudafed - Other Reaction, urinary retention    MEDICATIONS: Omeprazole Magnesium 20 MG Tablet Delayed Release  Tamsulosin HCl 0.4 MG Capsule  1 capsule PO Daily Take 30 minutes after evening meal  Aspir 81 1 tablet PO Daily  Flomax  Multivitamin  oxyCODONE  HCl 10 MG Tablet 1 tablet PO Q 6 H PRN  Rosuvastatin  Calcium  20 MG Tablet  Sildenafil Citrate 20 MG Tablet  Tadalafil 5 MG Tablet Take 1 to 4 tablets by mouth daily as needed 1 hour before planned activity  tiZANidine HCl 4 MG Tablet 1/2-1 tab po QHS PRN  Zolpidem  Tartrate ER 6.25 MG Tablet Extended Release 1 tablet PO Q HS PRN     GU PSH: Cysto Uretero Lithotripsy - 2009 ESWL - 2018, 2009       [facility] Notes: ESI Aug 31, 2017   NON-GU PSH: Repair Knee Ligament - 2009 Visit Complexity (formerly [id number]) - 01/29/2024, 01/29/2023     GU PMH: BPH w/LUTS - 01/29/2024, - 01/29/2023, - 2023 ED due to arterial insufficiency - 01/29/2024 Nocturia - 01/29/2024, - 01/29/2023, - 2023 Renal calculus - 01/29/2024, - 01/29/2023, - 2023, - 2021 (Stable), - 2019, Nephrolithiasis, - 2014 Elevated PSA - 01/29/2023, Elevated prostate specific antigen (PSA), - 2014 Straining on Urination - 2023 RLQ pain - 2021 Ureteral calculus, Left, Resolution of left ureteral stones. Will send for analysis. Discussed fluid modifications. - 2019, (Stable), - 2018, Calculus of ureter, - 2014 Flank Pain (Acute), Left, Ketorolac  30 mg IM today. Secondary to left ureteral calculi X 2. - 2019  Ureteral obstruction secondary to calculous (Acute), Left, Secondary to 2 left ureteral calculus - 2019 Hypocitraturia - 2018 Renal cyst - 2018 Urinary Frequency - 2018 Renal colic, Renal colic - 2014 Low back pain      PMH Notes:   1) Urolithiasis: He has a history of recurrent calcium  oxalate urolithiasis. He has undergone multiple SWL and ureteroscopic procedures. He initially presented to me in the fall of 2018.   2009: Ureteroscopic treatment by Dr. Alline  Sep 2018: ESWL (calcium  oxalate)  Mar 2019: Passed left ureteral stone (calcium  oxalate monohydrate)   NON-GU PMH: Arthritis GERD Hypercholesterolemia    FAMILY  HISTORY: 2 daughters - Daughter 1 son - Son Chronic Obstructive Pulmonary Disease - Father Laryngeal Cancer - Brother Malignant Melanoma Of Upper Arm - Brother nephrolithiasis - Father   SOCIAL HISTORY: Marital Status: Married Preferred Language: English; Ethnicity: Not Hispanic Or Latino; Race: White Current Smoking Status: Patient has never smoked.   Tobacco Use Assessment Completed: Used Tobacco in last 30 days? Does not use smokeless tobacco. Drinks 2 drinks per day. Types of alcohol consumed: Beer.  Does not use drugs. Drinks 3 caffeinated drinks per day.    REVIEW OF SYSTEMS:    GU Review Male:   Patient reports get up at night to urinate. Patient denies frequent urination, hard to postpone urination, burning/ pain with urination, leakage of urine, stream starts and stops, trouble starting your stream, have to strain to urinate , erection problems, and penile pain.  Gastrointestinal (Upper):   Patient reports nausea and vomiting. Patient denies indigestion/ heartburn.  Gastrointestinal (Lower):   Patient denies diarrhea and constipation.  Constitutional:   Patient denies fever, night sweats, weight loss, and fatigue.  Skin:   Patient denies skin rash/ lesion and itching.  Eyes:   Patient denies blurred vision and double vision.  Ears/ Nose/ Throat:   Patient denies sore throat and sinus problems.  Hematologic/Lymphatic:   Patient denies swollen glands and easy bruising.  Cardiovascular:   Patient denies leg swelling and chest pains.  Respiratory:   Patient denies cough and shortness of breath.  Endocrine:   Patient denies excessive thirst.  Musculoskeletal:   Patient denies joint pain and back pain.  Neurological:   Patient denies headaches and dizziness.  Psychologic:   Patient denies depression and anxiety.   Notes: Flank pain    VITAL SIGNS:      02/25/2024 08:42 AM  Weight 193 lb / 87.54 kg  Height 69 in / 175.26 cm  BP 123/73 mmHg  Heart Rate 66 /min  Temperature  97.3 F / 36.2 C  BMI 28.5 kg/m   MULTI-SYSTEM PHYSICAL EXAMINATION:    Constitutional: Well-nourished. No physical deformities. Normally developed. Good grooming.   Respiratory: Normal breath sounds. No labored breathing, no use of accessory muscles.   Cardiovascular: Regular rate and rhythm. No murmur, no gallop.   Skin: No paleness, no jaundice, no cyanosis. No lesion, no ulcer, no rash.  Neurologic / Psychiatric: Oriented to time, oriented to place, oriented to person. No depression, no anxiety, no agitation.  Gastrointestinal: Abdominal tenderness RLQ and RCVAT. No mass, no rigidity, non obese abdomen.      Complexity of Data:  Records Review:   Previous Patient Records  X-Ray Review: KUB: Reviewed Films. Discussed With Patient.  C.T. Stone Protocol: Reviewed Films. Discussed With Patient.     01/29/23 01/25/22  PSA  Total PSA 2.27 ng/mL 1.59 ng/mL    PROCEDURES:  C.T. Urogram - [id number]  8mm obstructing right distal stone. See report for details.       Patient confirmed No Neulasta OnPro Device.          KUB - [id number]  A single view of the abdomen is obtained. I don't clearly see a stone in the RUP and there is a new calcification in the medial right pelvis that looks like it could be in the bladder or possibly the distal ureter. He has mulltiple phleboliths in the pelvis and a chronic calcification in the RUQ that is not in the collecting system and is probably post surgical. He has spinal hardware but no other significant findings.       Patient confirmed No Neulasta OnPro Device.           Morphine  15mg  - [id number], [id number] Zero wasted   Qty: 15 Adm. By: Lovestar Francois  Unit: mg Adm. On: 02/25/2024 09:14 AM  Route: IM Lot No: [id number]  Freq: None Exp. Date: 03/24/2025    Mfgr.:   Site: Left Hip   ASSESSMENT:      ICD-10 Details  1 GU:   Ureteral calculus - N20.1 Acute, Systemic Symptoms - I discussed options including MET, ESWL and URS and will get him set up  for URS later today.   I have reviewed the risks of ureteroscopy including bleeding, infection, ureteral injury, need for a stent or secondary procedures, thrombotic events and anesthetic complications.     PLAN:           Orders X-Rays: C.T. Stone Protocol Without I.V. Contrast  X-Ray Notes:   History:   Hematuria: Yes / No   Patient to see MD after exam: Yes/ No   Previous exam:   When:   Where:   Diabetic: Yes / No   BUN/ Creatinine:   Date of last BUN Creatinine:   Weight in pounds:   Allergy- IV Contrast: Yes/ No  Prior Authorization #: [employer] MCR: NPCR            Schedule Procedure: 02/25/2024 at [facility] - [phone] - Morphine  4mg  (Ther/Proph/Diag Inj, [employer]/Im) - [id number], [id number]  Procedure: 02/25/2024 - Cysto Uretero Lithotripsy - [phone], right          Document Letter(s):  Created for Patient: Clinical Summary         Notes:   CC: Charmaine Bright PA.

## 2024-02-25 NOTE — Op Note (Signed)
 Renal calculi   01/2018: He returns today after having passed the 2 ureteral stones on the left side last year. He returns today for routine follow-up. He did complete a 24 hour urine in the fall that demonstrated low urine volume and hypocitraturia. He has made an effort to increase his urine volume with fluid hydration and has increased his dietary citrate. He denies any further symptomatic episodes since the fall. Unfortunately, he did undergo major back surgery with a lumbar fusion recently. He follows up today with a KUB x-ray   05/14/2020: Stable right renal calculi noted on last office visit KUB examination. He has not followed up since then. Presents today for evaluation of possible obstructing kidney stone.   Symptoms began approximately 6 days ago with worsening right lower quadrant abdominal pain. He was evaluated at a local urgent care and prescribed pain medication, instructed to follow up with Urology for possible evaluation of an obstructing calculus. He was prescribed pain medication, he also states being told there was blood in his urine. Since then he has had intermittent but recurrent right lower quadrant abdominal pain that will radiate some toward the groin. Not associated with flank or lower back pain/discomfort. Although initially not felt to be similar to previous stone events, the discomfort he is having now is consistent with prior incidences when he was trying to pass a ureteral calculus. Denies nausea or vomiting. He has not had any visible blood in the urine nor denies any interval stone material passage. Symptoms also not associated with fevers or chills. Since the onset of symptoms he has noted some increased sensation of incomplete emptying and increased urgency. He takes tamsulosin at baseline and endorses good control of lower urinary tract symptoms with that medication. Urinalysis is clear today.   01/25/2022: KUB did not show an obvious ureteral calculi at time of last  office visit. I requested a 2-week follow-up for repeat evaluation but that appointment never occurred. He was last seen in October 2021. Now back today for overdue follow-up exam. Previously on tamsulosin with noted stability of baseline lower urinary tract symptoms while on alpha-blocker therapy. A couple of weeks ago the prescription ran out. He noticed an almost immediate difference in force of stream as well as increased nocturia. Also with increased obstructive symptoms he has developed some pain or discomfort with voiding but denies true dysuria. Symptoms not associated with gross hematuria. 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 Since tamsulosin ran out he has had some exacerbation of symptoms complaining of burning pain and discomfort with radiation down the posterior right leg. He states this does not feel like prior kidney stone events but wanted to make mention of such. Pain is usually worse with lying on the affected side and with activity and position changes but it usually improves as the day progresses. UA today is clear. He denies any interval stone material passage.   01/29/2023: Returns today for annual follow-up exam. He remains on tamsulosin daily which he takes in the morning. Overall voiding symptoms grossly stable over the past year. He still has some variable nocturia which can range anywhere from 0-3 times nightly. Unclear if this is modified by fluid intake prior to bedtime or not. He has had no bothersome changes in daytime frequency, changes in force of stream. Denies any interval dysuria or gross hematuria. Denies any interval stone material passage. He has had no recurrence of unilateral lower back or flank pain/discomfort suggestive of  obstructive uropathy. Due for KUB today. Also needs updated PSA screening.   01/29/2024: Patient returns today for annual follow-up evaluation with KUB. He remains on tamsulosin daily which he now takes in the  evening. Denies any new or worsening lower urinary tract symptoms specifically no bothersome daytime frequency/urgency, changes in force of stream or increase sensation of weekly bladder emptying. He has had no interval dysuria, gross hematuria or interval treatment for UTI. He continues to be bothered by nocturia which is subjectively worsened. He gets up 3-4 times per night. His wife makes note that he often snores and has questionable apnea but has never been evaluated for OSA. Patient also denies any interval stone material passage. He has not had right sided stone burden which remains grossly stable on today's KUB study. He denies any interval correlating unilateral lower back or flank pain/discomfort suggestive of obstructive uropathy. UA today within normal limits. Patient also wanted to discuss erectile dysfunction. He is having increasing difficulty achieving and maintaining his erection. He tried sildenafil before that was effective but it caused shortness of breath so he discontinued it. He is not on any type of nitrate therapy and endorses being in good overall cardiovascular health.   02/25/24: Billy Erickson returns today with the onset over the weekend of severe right flank pain with some urgency. H ehas no hematuria. HE has had some nausea. KUB today demonstrates a possible 7-80mm stone in the right pelvis that seems more medial than it should for a stone. I don't see the RUP area well because of overlying bowel content. The stone was in the RUP on his last KUB. He has a chronic RUQ calcification that on subsequent CT is not in the ureter and may be from his prior right pyeloplasty. there is an obstructing 8mm stone in the right distal ureter. He got some relief with morphine  4mg .     ALLERGIES: Benadryl  - Other Reaction, urinary retention Gabapentin  - Headache Sudafed - Other Reaction, urinary retention    MEDICATIONS: Omeprazole Magnesium 20 MG Tablet Delayed Release  Tamsulosin HCl 0.4 MG Capsule  1 capsule PO Daily Take 30 minutes after evening meal  Aspir 81 1 tablet PO Daily  Flomax  Multivitamin  oxyCODONE  HCl 10 MG Tablet 1 tablet PO Q 6 H PRN  Rosuvastatin  Calcium  20 MG Tablet  Sildenafil Citrate 20 MG Tablet  Tadalafil 5 MG Tablet Take 1 to 4 tablets by mouth daily as needed 1 hour before planned activity  tiZANidine HCl 4 MG Tablet 1/2-1 tab po QHS PRN  Zolpidem  Tartrate ER 6.25 MG Tablet Extended Release 1 tablet PO Q HS PRN     GU PSH: Cysto Uretero Lithotripsy - 2009 ESWL - 2018, 2009       [facility] Notes: ESI Aug 31, 2017   NON-GU PSH: Repair Knee Ligament - 2009 Visit Complexity (formerly [id number]) - 01/29/2024, 01/29/2023     GU PMH: BPH w/LUTS - 01/29/2024, - 01/29/2023, - 2023 ED due to arterial insufficiency - 01/29/2024 Nocturia - 01/29/2024, - 01/29/2023, - 2023 Renal calculus - 01/29/2024, - 01/29/2023, - 2023, - 2021 (Stable), - 2019, Nephrolithiasis, - 2014 Elevated PSA - 01/29/2023, Elevated prostate specific antigen (PSA), - 2014 Straining on Urination - 2023 RLQ pain - 2021 Ureteral calculus, Left, Resolution of left ureteral stones. Will send for analysis. Discussed fluid modifications. - 2019, (Stable), - 2018, Calculus of ureter, - 2014 Flank Pain (Acute), Left, Ketorolac  30 mg IM today. Secondary to left ureteral calculi X 2. - 2019  Ureteral obstruction secondary to calculous (Acute), Left, Secondary to 2 left ureteral calculus - 2019 Hypocitraturia - 2018 Renal cyst - 2018 Urinary Frequency - 2018 Renal colic, Renal colic - 2014 Low back pain      PMH Notes:   1) Urolithiasis: He has a history of recurrent calcium  oxalate urolithiasis. He has undergone multiple SWL and ureteroscopic procedures. He initially presented to me in the fall of 2018.   2009: Ureteroscopic treatment by Dr. Alline  Sep 2018: ESWL (calcium  oxalate)  Mar 2019: Passed left ureteral stone (calcium  oxalate monohydrate)   NON-GU PMH: Arthritis GERD Hypercholesterolemia    FAMILY  HISTORY: 2 daughters - Daughter 1 son - Son Chronic Obstructive Pulmonary Disease - Father Laryngeal Cancer - Brother Malignant Melanoma Of Upper Arm - Brother nephrolithiasis - Father   SOCIAL HISTORY: Marital Status: Married Preferred Language: English; Ethnicity: Not Hispanic Or Latino; Race: White Current Smoking Status: Patient has never smoked.   Tobacco Use Assessment Completed: Used Tobacco in last 30 days? Does not use smokeless tobacco. Drinks 2 drinks per day. Types of alcohol consumed: Beer.  Does not use drugs. Drinks 3 caffeinated drinks per day.    REVIEW OF SYSTEMS:    GU Review Male:   Patient reports get up at night to urinate. Patient denies frequent urination, hard to postpone urination, burning/ pain with urination, leakage of urine, stream starts and stops, trouble starting your stream, have to strain to urinate , erection problems, and penile pain.  Gastrointestinal (Upper):   Patient reports nausea and vomiting. Patient denies indigestion/ heartburn.  Gastrointestinal (Lower):   Patient denies diarrhea and constipation.  Constitutional:   Patient denies fever, night sweats, weight loss, and fatigue.  Skin:   Patient denies skin rash/ lesion and itching.  Eyes:   Patient denies blurred vision and double vision.  Ears/ Nose/ Throat:   Patient denies sore throat and sinus problems.  Hematologic/Lymphatic:   Patient denies swollen glands and easy bruising.  Cardiovascular:   Patient denies leg swelling and chest pains.  Respiratory:   Patient denies cough and shortness of breath.  Endocrine:   Patient denies excessive thirst.  Musculoskeletal:   Patient denies joint pain and back pain.  Neurological:   Patient denies headaches and dizziness.  Psychologic:   Patient denies depression and anxiety.   Notes: Flank pain    VITAL SIGNS:      02/25/2024 08:42 AM  Weight 193 lb / 87.54 kg  Height 69 in / 175.26 cm  BP 123/73 mmHg  Heart Rate 66 /min  Temperature  97.3 F / 36.2 C  BMI 28.5 kg/m   MULTI-SYSTEM PHYSICAL EXAMINATION:    Constitutional: Well-nourished. No physical deformities. Normally developed. Good grooming.   Respiratory: Normal breath sounds. No labored breathing, no use of accessory muscles.   Cardiovascular: Regular rate and rhythm. No murmur, no gallop.   Skin: No paleness, no jaundice, no cyanosis. No lesion, no ulcer, no rash.  Neurologic / Psychiatric: Oriented to time, oriented to place, oriented to person. No depression, no anxiety, no agitation.  Gastrointestinal: Abdominal tenderness RLQ and RCVAT. No mass, no rigidity, non obese abdomen.      Complexity of Data:  Records Review:   Previous Patient Records  X-Ray Review: KUB: Reviewed Films. Discussed With Patient.  C.T. Stone Protocol: Reviewed Films. Discussed With Patient.     01/29/23 01/25/22  PSA  Total PSA 2.27 ng/mL 1.59 ng/mL    PROCEDURES:  C.T. Urogram - [id number]  8mm obstructing right distal stone. See report for details.       Patient confirmed No Neulasta OnPro Device.          KUB - [id number]  A single view of the abdomen is obtained. I don't clearly see a stone in the RUP and there is a new calcification in the medial right pelvis that looks like it could be in the bladder or possibly the distal ureter. He has mulltiple phleboliths in the pelvis and a chronic calcification in the RUQ that is not in the collecting system and is probably post surgical. He has spinal hardware but no other significant findings.       Patient confirmed No Neulasta OnPro Device.           Morphine  15mg  - [id number], [id number] Zero wasted   Qty: 15 Adm. By: Lovestar Francois  Unit: mg Adm. On: 02/25/2024 09:14 AM  Route: IM Lot No: [id number]  Freq: None Exp. Date: 03/24/2025    Mfgr.:   Site: Left Hip   ASSESSMENT:      ICD-10 Details  1 GU:   Ureteral calculus - N20.1 Acute, Systemic Symptoms - I discussed options including MET, ESWL and URS and will get him set up  for URS later today.   I have reviewed the risks of ureteroscopy including bleeding, infection, ureteral injury, need for a stent or secondary procedures, thrombotic events and anesthetic complications.     PLAN:           Orders X-Rays: C.T. Stone Protocol Without I.V. Contrast  X-Ray Notes:   History:   Hematuria: Yes / No   Patient to see MD after exam: Yes/ No   Previous exam:   When:   Where:   Diabetic: Yes / No   BUN/ Creatinine:   Date of last BUN Creatinine:   Weight in pounds:   Allergy- IV Contrast: Yes/ No  Prior Authorization #: [employer] MCR: NPCR            Schedule Procedure: 02/25/2024 at [facility] - [id number] - Morphine  4mg  (Ther/Proph/Diag Inj, Wright/Im) - [id number], [id number]  Procedure: 02/25/2024 - Cysto Uretero Lithotripsy - [phone], right

## 2024-02-26 ENCOUNTER — Encounter (HOSPITAL_COMMUNITY): Payer: Self-pay | Admitting: Urology

## 2024-03-07 DIAGNOSIS — N201 Calculus of ureter: Secondary | ICD-10-CM | POA: Diagnosis not present

## 2024-03-11 ENCOUNTER — Other Ambulatory Visit: Payer: Self-pay | Admitting: Neurology

## 2024-03-13 ENCOUNTER — Other Ambulatory Visit: Payer: Self-pay | Admitting: Pulmonary Disease

## 2024-03-13 DIAGNOSIS — J4520 Mild intermittent asthma, uncomplicated: Secondary | ICD-10-CM

## 2024-03-19 DIAGNOSIS — R0683 Snoring: Secondary | ICD-10-CM | POA: Diagnosis not present

## 2024-03-20 ENCOUNTER — Ambulatory Visit: Admitting: Neurology

## 2024-03-20 ENCOUNTER — Encounter: Payer: Self-pay | Admitting: Neurology

## 2024-03-20 ENCOUNTER — Telehealth: Payer: Self-pay | Admitting: Neurology

## 2024-03-20 VITALS — BP 124/69 | HR 73 | Ht 70.0 in | Wt 195.0 lb

## 2024-03-20 DIAGNOSIS — F32A Depression, unspecified: Secondary | ICD-10-CM

## 2024-03-20 DIAGNOSIS — G3184 Mild cognitive impairment, so stated: Secondary | ICD-10-CM | POA: Diagnosis not present

## 2024-03-20 NOTE — Patient Instructions (Signed)
 Management of Memory Problems  There are some general things you can do to help manage your memory problems.  Your memory may not in fact recover, but by using techniques and strategies you will be able to manage your memory difficulties better.  1)  Establish a routine. Try to establish and then stick to a regular routine.  By doing this, you will get used to what to expect and you will reduce the need to rely on your memory.  Also, try to do things at the same time of day, such as taking your medication or checking your calendar first thing in the morning. Think about think that you can do as a part of a regular routine and make a list.  Then enter them into a daily planner to remind you.  This will help you establish a routine.  2)  Organize your environment. Organize your environment so that it is uncluttered.  Decrease visual stimulation.  Place everyday items such as keys or cell phone in the same place every day (ie.  Basket next to front door) Use post it notes with a brief message to yourself (ie. Turn off light, lock the door) Use labels to indicate where things go (ie. Which cupboards are for food, dishes, etc.) Keep a notepad and pen by the telephone to take messages  3)  Memory Aids A diary or journal/notebook/daily planner Making a list (shopping list, chore list, to do list that needs to be done) Using an alarm as a reminder (kitchen timer or cell phone alarm) Using cell phone to store information (Notes, Calendar, Reminders) Calendar/White board placed in a prominent position Post-it notes  In order for memory aids to be useful, you need to have good habits.  It's no good remembering to make a note in your journal if you don't remember to look in it.  Try setting aside a certain time of day to look in journal.  4)  Improving mood and managing fatigue. There may be other factors that contribute to memory difficulties.  Factors, such as anxiety, depression and tiredness can  affect memory. Regular gentle exercise can help improve your mood and give you more energy. Simple relaxation techniques may help relieve symptoms of anxiety Try to get back to completing activities or hobbies you enjoyed doing in the past. Learn to pace yourself through activities to decrease fatigue. Find out about some local support groups where you can share experiences with others. Try and achieve 7-8 hours of sleep at night.  Problems With Thinking and Memory (Mild Neurocognitive Disorder): What to Know Mild neurocognitive disorder, formerly known as mild cognitive impairment, is a disorder where your memory doesn't work as well as it should. It may also affect other mental abilities like thinking, communicating, behavior, and being able to finish tasks. These problems can be noticed and measured. But they usually don't stop you from doing daily activities or living on your own. Mild neurocognitive disorder usually happens after 76 years of age. But it can also happen at younger ages. It's not as serious as major neurocognitive disorder, also known as dementia, but it may be the first sign of it. In general, the symptoms of this condition get worse over time. In rare cases, symptoms can get better. What are the causes? This condition may be caused by: Brain disorders like Alzheimer's disease, Parkinson's disease, and other conditions that slowly damage nerve cells. Diseases that affect the blood vessels in the brain and cause small strokes. Certain  infections, like HIV. Traumatic brain injury. Other medical conditions, such as brain tumors, underactive thyroid  (hypothyroidism), and not having enough vitamin B12. Using certain drugs or medicines. What increases the risk? Being older than 76 years of age. Being male. Having a lower level of education. Diabetes, high blood pressure, high cholesterol, and other conditions that raise the risk for blood vessel diseases. Untreated or  undertreated sleep apnea. Having a certain type of gene that can be inherited, or passed down from parent to child. Long-term health problems like heart disease, lung disease, liver disease, kidney disease, or depression. What are the signs or symptoms? Trouble remembering things. You may: Forget names, phone numbers, or details of recent events. Forget about social events and appointments. Often forget where you put your car keys or other items. Trouble thinking and solving problems. You may have trouble with complex tasks like: Paying bills. Driving in places you don't know well. Trouble communicating. You may have trouble: Finding the right word or naming an object. Forming a sentence that makes sense. Understanding what you read or hear. Changes in your behavior or personality. When this happens, you may: Lose interest in the things you used to enjoy. Avoid being around people. Get angry more easily than usual. Act before thinking. How is this diagnosed? This condition is diagnosed based on: Your symptoms. Your health care provider may ask you and the people you spend time with, like family and friends, about your symptoms. Memory tests and other tests to check how your brain is working. Your provider may refer you to a provider called a neurologist or a mental health specialist. To try to find out the cause of your condition, your provider may: Get a detailed medical history. Ask about use of alcohol, drugs, and medicines. Do a physical exam. Order blood tests and brain imaging tests. How is this treated? Mild neurocognitive disorder that's caused by medicine use, drug use, infection, or another medical condition may get better when the cause is treated, or when medicines or drugs are stopped. If this disorder has another cause, it usually doesn't improve and may get worse. In these cases, the goal of treatment is to help you manage the symptoms. This may include: Medicines to  help with memory and behavior symptoms. Talk therapy. This provides education, emotional support, memory aids, and other ways of making up for problems with mental tasks. Lifestyle changes. These may include: Getting regular exercise. Eating a healthy diet that includes omega-3 fatty acids. Doing things to challenge your thinking and memory skills. Spending more time being with and talking to other people. Using routines like having regular times for meals and going to bed. Follow these instructions at home: Eating and drinking  Drink more fluids as told. Eat a healthy diet that includes omega-3 fatty acids. These can be found in: Fish. Nuts. Leafy vegetables. Vegetable oils. If you drink alcohol: Limit how much you have to: 0-1 drink a day if you're male. 0-2 drinks a day if you're male. Know how much alcohol is in your drink. In the [place] one drink is one 12 oz bottle of beer (355 mL), one 5 oz glass of wine (148 mL), or one 1 oz glass of hard liquor (44 mL). Lifestyle  Get regular exercise as told by your provider. Do not smoke, vape, or use nicotine or tobacco. Use healthy ways to manage stress. If you need help managing stress, ask your provider. Keep spending time with other people. Keep your mind active  by doing activities you enjoy, like reading or playing games. Make sure you get good sleep at night. These tips can help: Try not to take naps during the day. Keep your bedroom dark and cool. Do not exercise in the few hours before you go to bed. Do not have foods or drinks with caffeine at night. General instructions Take medicines only as told. Your provider may tell you to avoid taking medicines that can affect thinking. These include some medicines for pain or sleeping. Work with your provider to find out: What things you need help with. What your safety needs are. Where to find more information [employer] on Aging: [email or web address] Contact a health care  provider if: You have any new symptoms. Get help right away if: You have new confusion or your confusion gets worse. You act in ways that put you or your family in danger. This information is not intended to replace advice given to you by your health care provider. Make sure you discuss any questions you have with your health care provider. Document Revised: 01/02/2023 Document Reviewed: 01/02/2023 Elsevier Patient Education  2024 [employer].

## 2024-03-20 NOTE — Progress Notes (Signed)
 Provider:  Dedra Gores, MD  Primary Care Physician:  Dayna Motto, DO [address] [place] [address]     Referring Provider: Katina Pfeiffer, Pa-c [address] [place],  [place] [address]          Chief Complaint according to patient   Patient presents with:          Pt was initially started on aricept  hade SE and stopped the med and was started on memantine . After his last visit there were a couple moments where he was lost driving. He took a spanish class and found that it was challenging. He feels like he is slower in acknowleding.   He is more emotionally, tears-up whe watching just a commercial. Very sad, pensive, melancholia, slower mind- ,  feels his reaction time is longer.  Sister was dx with terminal lung cancer, her husband died last week.  His Brother had committed suicide.   He is getting ready to complete a HST.          HISTORY OF PRESENT ILLNESS:  Billy Erickson is a 78 y.o. male patient who is here for revisit 03/20/2024 for cognitive concern- HST still pending. Has undergone anaesthesia for kidney stone surgery / had hypoxia and was told he has sleep apnea.  Post surgical recovery was associated with prolonged hung over feeling' and blurred mind.  Mr. Albaugh described his functional activities questionnaire in great detail and that helps me to understand the particular difficulties he feels he encounters and daily life.  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  He has still problems when he shops for example that he forgets that he has a list with him or where the list is, and sometimes he forgets things that are on the list because he forgets to check it.  He does not have trouble with you know skill games hobbies he  will use kitchen implements without difficulties simple recipes, he requires assistance for  more complicated full meals.   The problem there is not following a recipe but coordinating the times different part of the recipe come to finish.  So it is a multitasking and time management challenge he has felt.   He has no trouble keeping track of current events also he finds most of the development  regretful.    He has had some trouble again with discussing a story line of a TV show or of a book and he is not always sure which actor is representing which role so the person's are what throws him off it is not necessarily the story line but which role is occupied by which actor.   He has trouble with remembering appointments family occasions holidays, but sets out his own medication once a week.  Traveling by car he has gotten lost.  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  Its the reading of the sheet music and translate it to the instrument.   He took Spanish classes but struggles to advance - and feels he wouldn't have had these difficulties 5 years ago. SABRA   His tests results are summarized here :  APO E Genotyping Result: E3/E3   A --  Beta-amyloid 42/40 Ratio 0.119  Beta-amyloid 42 26.81  Beta-amyloid 40 225.32  T -- [id number] 1.26 High   N -- NfL, Plasma 3.55    Dementia panel : negative.   PET scan Brain, metabolic FINDINGS: Normal relative cortical metabolism within the high parietal lobes and frontal lobes. Normal cortical metabolism within the occipital lobes. No decreased relative cortical metabolism to suggest Alzheimer's disease pathology or frontal dementia.   IMPRESSION: Normal cerebral cortical metabolism. IMPRESSION: No acute or reversible finding. Age related volume loss without subjective lobar predominance. Minimal small vessel change of the deep white matter, less than often seen at this age.      Electronically Signed   By: Oneil Officer M.D.   On: 09/09/2023 17:57   Assessment and plan after  April Consult visit.  The patient  is enrolled into a NIH study for patients with a family history of dementia and the effects of daily exercise.    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 No impairment of ADLs.  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  3)  suspect a depression component to the tests outcome.      Plan:  Treatment plan and additional workup planned after today includes:    1)  Dr. Maryellen will need to undergo  ATN and Alzheimer's genetic risk factor testing.  2)   GNA dementia battery ordered.  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  I like for the patient to resume exercise.    Dependent on ATN and amyloid outcome , will decide on further PET scan type. In any case , I will order Aricept  at 5 mg daily.  Plan to increase to 10 mg in follow up visit. SABRA Loan Hx : see previous note  Social HX; see previous note       Review of Systems: Out of a complete 14 system review, the patient complains of only the following symptoms, and all other reviewed systems are negative.:   GDS 6/ 15 .     03/20/2024    2:35 PM 11/19/2023    2:08 PM  MMSE - Mini Mental State Exam  Orientation to time 5 3  Orientation to Place 5 5  Registration 3 3  Attention/ Calculation 3 3  Recall 3 3  Language- name 2 objects 2 2  Language- repeat 1 1  Language- follow 3 step command 3 3  Language- read & follow direction 1 1  Write a sentence 1 1  Copy design 1 0  Total score 28 25          03/20/2024    2:36 PM  Montreal Cognitive Assessment   Visuospatial/ Executive (0/5) 5  Naming (0/3) 3  Attention: Read list of digits (0/2) 2  Attention: Read list of letters (0/1) 1  Attention: Serial 7 subtraction starting at 100 (0/3) 2   Language: Repeat phrase (0/2) 2  Language : Fluency (0/1) 1  Abstraction (0/2) 2  Delayed Recall (0/5) 5  Orientation (0/6) 6  Total 29       Social History   Socioeconomic History   Marital status: Married    Spouse name: Not on file   Number of children: Not on file   Years of education: Not on file   Highest education level: Not on file  Occupational History  Not on file  Tobacco Use   Smoking status: Never   Smokeless tobacco: Never  Vaping Use   Vaping status: Never Used  Substance and Sexual Activity   Alcohol use: Yes    Alcohol/week: 6.0 standard drinks of alcohol    Types: 6 Glasses of wine per week    Comment: social   Drug use: No   Sexual activity: Not on file  Other Topics Concern   Not on file  Social History Narrative   Not on file   Social Drivers of Health   Financial Resource Strain: Low Risk  (04/18/2021)   Received from [facility]   Overall Financial Resource Strain (CARDIA)    Difficulty of Paying Living Expenses: Not hard at all  Food Insecurity: No Food Insecurity (04/18/2021)   Received from [facility]   Hunger Vital Sign    Within the past 12 months, you worried that your food would run out before you got the money to buy more.: Never true    Within the past 12 months, the food you bought just didn't last and you didn't have money to get more.: Never true  Transportation Needs: No Transportation Needs (04/18/2021)   Received from [facility] - Transportation    Lack of Transportation (Medical): No    Lack of Transportation (Non-Medical): No  Physical Activity: Sufficiently Active (04/18/2021)   Received from [facility]   Exercise Vital Sign    On average, how many days per week do you engage in moderate to strenuous exercise (like a brisk walk)?: 4 days    On average, how many minutes do you engage in exercise at this level?: 50 min  Stress: Stress Concern Present (04/18/2021)   Received from [facility] - Occupational Stress Questionnaire    Feeling of Stress : Rather much  Social Connections: Unknown (12/01/2021)   Received from [facility]   Social Network    Social Network: Not on file    Family History  Problem Relation Age of Onset   Alzheimer's disease Mother     Past Medical History:  Diagnosis Date   Aortic regurgitation    mild to moderate AR 05/29/16 echo [facility])   Arthritis    Dyspnea    with exercise only   Enlarged prostate    GERD (gastroesophageal reflux disease)    History of kidney stones    Hyperlipidemia    Kidney stones    Liver cyst    Wrist pain, right     Past Surgical History:  Procedure Laterality Date   ANTERIOR CRUCIATE LIGAMENT REPAIR     ANTERIOR LAT LUMBAR FUSION Right 01/08/2018   Procedure: ANTERIOR LATERAL INTERBODY FUSION LUMBAR THREE- LUMBAR FOUR, LUMBAR FOUR- LUMBAR FIVE;  Surgeon: Lanis Pupa, MD;  Location: [facility];  Service: Neurosurgery;  Laterality: Right;  LUMBAR INTERBODY FUSION   APPLICATION OF ROBOTIC ASSISTANCE FOR SPINAL PROCEDURE N/A 01/08/2018   Procedure: APPLICATION OF ROBOTIC ASSISTANCE FOR SPINAL PROCEDURE;  Surgeon: Lanis Pupa, MD;  Location: [facility] OR;  Service: Neurosurgery;  Laterality: N/A;  APPLICATION OF ROBOTIC ASSISTANCE FOR SPINAL PROCEDURE   CYSTOSCOPY/RETROGRADE/URETEROSCOPY  02/13/2008   basketing of fragments   CYSTOSCOPY/URETEROSCOPY/HOLMIUM LASER/STENT PLACEMENT Right 02/25/2024   Procedure: CYSTOSCOPY/URETEROSCOPY/HOLMIUM LASER/STENT PLACEMENT;  Surgeon: Watt Rush, MD;  Location: [facility];  Service: Urology;  Laterality: Right;   EXTRACORPOREAL SHOCK WAVE LITHOTRIPSY Left 03/19/2017   Procedure: LEFT EXTRACORPOREAL SHOCK WAVE LITHOTRIPSY (ESWL);  Surgeon: Cam Morene ORN, MD;  Location: [facility];  Service: Urology;  Laterality: Left;   KNEE ARTHROSCOPY WITH MEDIAL MENISECTOMY Left 08/18/2016   Procedure: LEFT KNEE ARTHROSCOPY, CHONDROPLASTY AND  PARTIAL MEDIAL AND LATERAL MENISCECTOMY;  Surgeon: Evalene JONETTA Chancy, MD;  Location: [facility];  Service: Orthopedics;  Laterality: Left;   KNEE SURGERY Right    LUMBAR PERCUTANEOUS PEDICLE SCREW 3 LEVEL N/A 01/08/2018   Procedure: LUMBAR PERCUTANEOUS PEDICLE SCREW PLACEMENT LUMBAR THREE-FOUR, LUMBAR FOUR-FIVE, LUMBAR FIVE-SACRAL ONE;  Surgeon: Lanis Pupa, MD;  Location: [facility];  Service: Neurosurgery;  Laterality: N/A;   MRI     nuclear stress test     TONSILLECTOMY     TRANSFORAMINAL LUMBAR INTERBODY FUSION (TLIF) WITH PEDICLE SCREW FIXATION 1 LEVEL N/A 01/08/2018   Procedure: TRANSFORAMINAL LUMBAR INTERBODY FUSION LUMBAR FIVE-SACRAL ONE;  Surgeon: Lanis Pupa, MD;  Location: [facility];  Service: Neurosurgery;  Laterality: N/A;   WRIST ARTHROSCOPY WITH ULNA SHORTENING Right 07/06/2015   Procedure: RIGHT WRIST WITH  ARTHROSCOPIC DEBRIDEMENT AND ULNAR SHORTENING OSTEOTOMY;  Surgeon: Alm Hummer, MD;  Location: [facility];  Service: Orthopedics;  Laterality: Right;     Current Outpatient Medications on File Prior to Visit  Medication Sig Dispense Refill   albuterol  (VENTOLIN  HFA) 108 (90 Base) MCG/ACT inhaler Inhale 2 puffs into the lungs every 6 (six) hours as needed for wheezing or shortness of breath.     ezetimibe  (ZETIA ) 10 MG tablet Take 1 tablet (10 mg total) by mouth daily. Pt needs to keep September appt for any future refills. 60 tablet 0   fluticasone  (FLONASE ) 50 MCG/ACT nasal spray Place 1 spray into both nostrils daily. 18.2 mL 2   HYDROcodone -acetaminophen  (NORCO) 10-325 MG tablet Take 0.5-1 tablets by mouth as needed for moderate pain (pain score 4-6).     ibuprofen (ADVIL,MOTRIN) 100 MG tablet Take 200 mg by mouth every 6 (six) hours as needed for pain or fever.     memantine  (NAMENDA ) 10 MG tablet Take 1 tablet (10 mg total) by mouth 2 (two) times daily. 60 tablet 5   methocarbamol  (ROBAXIN ) 500 MG tablet Take 1 tablet (500 mg total) by  mouth every 6 (six) hours as needed for muscle spasms. 90 tablet 2   omeprazole (PRILOSEC) 20 MG capsule Take 20 mg by mouth daily.     pregabalin (LYRICA) 75 MG capsule Take 75 mg by mouth 2 (two) times daily.     rosuvastatin  (CRESTOR ) 40 MG tablet Take 1 tablet (40 mg total) by mouth daily. 60 tablet 0   tamsulosin (FLOMAX) 0.4 MG CAPS capsule Take 0.4 mg by mouth daily.  3   WIXELA INHUB 250-50 MCG/ACT AEPB INHALE 1 PUFF INTO THE LUNGS IN THE MORNING AND AT BEDTIME. 60 each 0   No current facility-administered medications on file prior to visit.    Allergies  Allergen Reactions   Diphenhydramine  Hcl     Other reaction(s): Other (See Comments) jittery   Sudafed [Pseudoephedrine] Anxiety and Other (See Comments)    Anxious, unable to urinate,jittery     DIAGNOSTIC DATA (LABS, IMAGING, TESTING) - I reviewed patient records, labs, notes, testing and imaging myself where available.  Lab Results  Component Value Date   WBC 7.5 02/25/2024   HGB 14.2 02/25/2024   HCT 43.2 02/25/2024   MCV 94.7 02/25/2024   PLT 141 (L) 02/25/2024      Component Value Date/Time   NA 137 02/25/2024 1325   NA 141 06/27/2022  1604   K 4.5 02/25/2024 1325   CL 102 02/25/2024 1325   CO2 25 02/25/2024 1325   GLUCOSE 121 (H) 02/25/2024 1325   BUN 21 02/25/2024 1325   BUN 21 06/27/2022 1604   CREATININE 1.31 (H) 02/25/2024 1325   CALCIUM  9.5 02/25/2024 1325   PROT 7.2 11/19/2023 1529   ALBUMIN  4.7 06/27/2022 1604   AST 39 06/27/2022 1604   ALT 36 06/27/2022 1604   ALKPHOS 70 06/27/2022 1604   BILITOT 0.5 06/27/2022 1604   GFRNONAA 56 (L) 02/25/2024 1325   GFRAA 72 07/01/2020 0723   Lab Results  Component Value Date   CHOL 156 09/26/2021   HDL 51 09/26/2021   LDLCALC 87 09/26/2021   TRIG 100 09/26/2021   CHOLHDL 3.1 09/26/2021   Lab Results  Component Value Date   HGBA1C 6.0 (H) 11/19/2023   No results found for: VITAMINB12 Lab Results  Component Value Date   TSH 0.926  11/19/2023    PHYSICAL EXAM:  Vitals:   03/20/24 1429  BP: 124/69  Pulse: 73   No data found. Body mass index is 27.98 kg/m.   Wt Readings from Last 3 Encounters:  03/20/24 195 lb (88.5 kg)  02/25/24 193 lb (87.5 kg)  11/19/23 198 lb 12.8 oz (90.2 kg)     Ht Readings from Last 3 Encounters:  03/20/24 5' 10 (1.778 m)  02/25/24 5' 9 (1.753 m)  11/19/23 5' 10 (1.778 m)      General: The patient is awake, alert and appears not in acute distress and groomed. Head: Normocephalic, atraumatic.  Neck is supple. neck circumference: 17  Cardiovascular:  Regular rate and palpable peripheral pulse:  Respiratory: clear to auscultation.  Mallampati 2, Skin:  Without evidence of edema, or rash Trunk: BMI is 28.5   and patient  has normal posture.     Neurologic exam : The patient is awake and alert, oriented to place and time.  Memory subjective  described as impaired  There is a normal attention span & concentration ability.  Speech is fluent without dysarthria, dysphonia or aphasia.  Mood and affect are appropriate.   Cranial nerves: Pupils are equal and briskly reactive to light. Funduscopic exam without  evidence of pallor or edema. Extraocular movements  in vertical and horizontal planes intact and without nystagmus. Visual fields by finger perimetry are intact. Hearing to finger rub intact.  Facial sensation intact to fine touch. Facial motor strength is symmetric and tongue and uvula move midline.   Motor exam:  Tone diffusely elevated but hx of shoulder injury and Cervical DDD , and normal muscle bulk and symmetric normal strength in all extremities. Grip Strength is strong!  Proximal strength of shoulder muscles  was  reduced. and hip flexors were strong. .   Sensory:  Fine touch and vibration were tested . Proprioception was tested in the upper extremities only and was intact  normal.   Coordination: Rapid alternating movements in the fingers/hands were normal.   Finger-to-nose maneuver was tested and showed no evidence of ataxia, dysmetria or tremor.   Gait and station: Patient walked without assistive device -  Core Strength within normal limits. Stance is stable and of normal base.  Tandem gait is affected , limp with left leg , turns outwards     ASSESSMENT AND PLAN :   78 y.o. year old male  here with:    1) subjective concern of  mild cognitive dysfunction, not showing up on MOCA test. No  AD pathology.   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  Zoloft has not made a difference.  Chronic back pain affects life quality.   3)  he did try Aricept  but couldn't tolerate because of leg cramps, now on Namenda .    This level of cognitive function is not even scored as MCI but I believe the patient could still  develop MCI in the coming years.  He lost some weight, keeps exercising, he sleeps well.   PLAN :  PRN revisit if memory declines.  I will refer to have formal memory testing with neuropsychologist.     I would like to thank Dayna Motto, DO and Katina Pfeiffer, Pa-c [address] [place],  [place] [address] for allowing me to meet with this pleasant patient.   Sleep Clinic Patients are generally offered input on sleep hygiene, life style changes and how to improve compliance with medical treatment where applicable. Review and reiteration of good sleep hygiene measures is offered to any sleep clinic patient, be it in the first consultation or with any follow up visits.    Any patient with sleepiness should be cautioned not to drive, work at heights, or operate dangerous or heavy equipment when feeling tired or sleepy.      The patient will be seen in follow-up in the sleep clinic at [facility] for discussion of test results, sleep related symptoms and treatment compliance review, further management strategies, etc.   The referring provider will be notified of the  test results.   The patient's condition requires frequent monitoring and adjustments in the treatment plan, reflecting the ongoing complexity of care.  This provider is the continuing focal point for all needed services for this condition.  After spending a total time of  40  minutes face to face and time for  history taking, physical and neurologic examination, review of laboratory studies,  personal review of imaging studies, reports and results of other testing and review of referral information / records as far as provided in visit,   Electronically signed by: Dedra Gores, MD 03/20/2024 2:56 PM  [facility] Sleep Board certified by The [employer] of Sleep Medicine and Diplomate of the [employer] of Sleep Medicine. Board certified In Neurology through the ABPN, Fellow of the [employer] of Neurology.

## 2024-03-20 NOTE — Telephone Encounter (Signed)
 Referral for Neuropsychology sent thru epic [facility]  [facility] [phone]

## 2024-04-01 ENCOUNTER — Encounter: Payer: Self-pay | Admitting: Neurology

## 2024-04-02 DIAGNOSIS — Z2989 Encounter for other specified prophylactic measures: Secondary | ICD-10-CM | POA: Diagnosis not present

## 2024-04-02 DIAGNOSIS — Z23 Encounter for immunization: Secondary | ICD-10-CM | POA: Diagnosis not present

## 2024-04-10 ENCOUNTER — Other Ambulatory Visit: Payer: Self-pay | Admitting: Nurse Practitioner

## 2024-04-10 ENCOUNTER — Other Ambulatory Visit: Payer: Self-pay | Admitting: Pulmonary Disease

## 2024-04-10 DIAGNOSIS — J4520 Mild intermittent asthma, uncomplicated: Secondary | ICD-10-CM

## 2024-04-14 ENCOUNTER — Encounter (HOSPITAL_BASED_OUTPATIENT_CLINIC_OR_DEPARTMENT_OTHER): Payer: Self-pay | Admitting: Nurse Practitioner

## 2024-04-14 ENCOUNTER — Ambulatory Visit (HOSPITAL_BASED_OUTPATIENT_CLINIC_OR_DEPARTMENT_OTHER): Admitting: Nurse Practitioner

## 2024-04-14 VITALS — BP 134/78 | HR 84 | Resp 17 | Ht 70.0 in | Wt 199.0 lb

## 2024-04-14 DIAGNOSIS — I351 Nonrheumatic aortic (valve) insufficiency: Secondary | ICD-10-CM | POA: Diagnosis not present

## 2024-04-14 DIAGNOSIS — I2583 Coronary atherosclerosis due to lipid rich plaque: Secondary | ICD-10-CM

## 2024-04-14 DIAGNOSIS — R0609 Other forms of dyspnea: Secondary | ICD-10-CM

## 2024-04-14 DIAGNOSIS — R9431 Abnormal electrocardiogram [ECG] [EKG]: Secondary | ICD-10-CM

## 2024-04-14 DIAGNOSIS — I700 Atherosclerosis of aorta: Secondary | ICD-10-CM | POA: Diagnosis not present

## 2024-04-14 DIAGNOSIS — I2510 Atherosclerotic heart disease of native coronary artery without angina pectoris: Secondary | ICD-10-CM

## 2024-04-14 DIAGNOSIS — E785 Hyperlipidemia, unspecified: Secondary | ICD-10-CM | POA: Diagnosis not present

## 2024-04-14 DIAGNOSIS — J4520 Mild intermittent asthma, uncomplicated: Secondary | ICD-10-CM | POA: Diagnosis not present

## 2024-04-14 MED ORDER — EZETIMIBE 10 MG PO TABS: 10.0000 mg | ORAL_TABLET | Freq: Every day | ORAL | 3 refills | Status: AC

## 2024-04-14 MED ORDER — ROSUVASTATIN CALCIUM 40 MG PO TABS: 40.0000 mg | ORAL_TABLET | Freq: Every day | ORAL | 3 refills | Status: AC

## 2024-04-14 MED ORDER — METOPROLOL TARTRATE 50 MG PO TABS: 50.0000 mg | ORAL_TABLET | Freq: Once | ORAL | 0 refills | Status: AC

## 2024-04-14 NOTE — Progress Notes (Unsigned)
 Cardiology Office Note   Date:  04/14/2024  ID:  Billy Erickson, DOB Dec 17, 1945, MRN [id number] PCP: Dayna Motto, DO  [facility] Providers Cardiologist:  Aleene Passe, MD (Inactive) { Click to update primary MD,subspecialty MD or APP then REFRESH:1}    PMH Aortic valve insufficiency Mild AI  on TTE 12/26/2018 and 07/21/22 Hyperlipidemia CAD Coronary CTA 12/13/2018 CAC Score 168 (50th percentile) Nonobstructive CAD Aortic atherosclerosis  Referred to cardiology and seen by Dr. Passe via telemedicine April 2020 for evaluation of shortness of breath.  He was started on an albuterol  inhaler and scheduled for echocardiogram and stress test.  TTE 12/26/2018 revealed normal LV function, mild AI.  Coronary CTA revealed elevated CAC score of 168 (50th percentile), mild mixed plaque in LAD, LCx with no significant stenosis, RCA no stenosis.  Rosuvastatin  was increased for LDL goal less than 70.  Last cardiology clinic visit was 06/27/2022 with me.  He reported severe shortness of breath that at times requires him to lay down.  These episodes occur at various times and do not consistently worsen with exertion.  Recent severe episode caused him to have to lay down at the top of the stairs.  He generally feels better after 5 minutes or so.  Walking the dog, exercising a few days a week and gardening.  When exercising on elliptical or treadmill, feels that since there is a gradual increase in heart rate, symptoms do not occur.  Episodes seem to occur more frequently with sudden increase in HR.  He gets lightheaded and clammy but no presyncope or syncope.  Has not checked HR or BP at that time.  Not on antihypertensive therapy.  Admits hydration is poor.  TTE 07/21/2022 revealed hyperdynamic LVEF 70 to 75%, G1 DD, mild AI.  Cardiac monitor completed 07/13/2022 revealed predominant NSR, rare episodes of nonsustained ventricular tachycardia lasting 4-6 beats, no significant arrhythmias.  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  He had normal exercise capacity overall.  His father died of obstructive lung disease.  No history of smoking.  Baseline pulmonary function test were within normal limits.  He was advised to stop Spiriva  and start Advair 250-51 puff twice daily.  History of Present Illness Billy Erickson is a 78 y.o. male *** Started playing flute, worsened his shortness of breath  ROS: ***  Studies Reviewed EKG Interpretation Date/Time:  Monday April 14 2024 14:56:26 EDT Ventricular Rate:  73 PR Interval:  162 QRS Duration:  90 QT Interval:  372 QTC Calculation: 409 R Axis:   -30  Text Interpretation: Sinus rhythm with Premature atrial complexes Left axis deviation Low voltage QRS Cannot rule out Anterior infarct , age undetermined When compared with ECG of 19-Aug-2020 11:03, Premature atrial complexes are now Present QRS axis Shifted left Minimal criteria for Anterior infarct are now Present Confirmed by Percy Browning [id number]) on 04/14/2024 3:20:19 PM    ***  No results found for: LIPOA  Risk Assessment/Calculations           Physical Exam VS:  BP 134/78 (BP Location: Left Arm, Patient Position: Sitting, Cuff Size: Large)   Pulse 84   Resp 17   Ht 5' 10 (1.778 m)   Wt 199 lb (90.3 kg)   SpO2 96%   BMI 28.55 kg/m    Wt Readings from Last 3 Encounters:  04/14/24 199 lb (90.3 kg)  03/20/24 195 lb (88.5 kg)  02/25/24  193 lb (87.5 kg)    GEN: Well nourished, well developed in no acute distress NECK: No JVD; No carotid bruits CARDIAC: ***RRR, no murmurs, rubs, gallops RESPIRATORY:  Clear to auscultation without rales, wheezing or rhonchi  ABDOMEN: Soft, non-tender, non-distended EXTREMITIES:  No edema; No deformity   ASSESSMENT AND PLAN ***    {Are you ordering a CV Procedure (e.g. stress test, cath, DCCV, TEE, etc)?   Press F2        :[id number]}   Dispo: ***  Signed, Rosaline Bane, NP-C

## 2024-04-14 NOTE — Patient Instructions (Signed)
 Medication Instructions:   Your physician recommends that you continue on your current medications as directed. Please refer to the Current Medication list given to you today.   *If you need a refill on your cardiac medications before your next appointment, please call your pharmacy*  Lab Work:  Your physician recommends that you return for a FASTING lipid profile/cmet, fasting after midnight at any lab corp. Patient given paperwork today.   If you have labs (blood work) drawn today and your tests are completely normal, you will receive your results only by: MyChart Message (if you have MyChart) OR A paper copy in the mail If you have any lab test that is abnormal or we need to change your treatment, we will call you to review the results.  Testing/Procedures:    Your cardiac CT will be scheduled at one of the below locations:     [facility] [address]  [place], [place] [address] [phone]   If scheduled at the [facility] at [address], please enter the parking lot using the [address] entrance and use the FREE valet service at the patient drop-off area. Enter the building and check-in with registration on the main floor.   Please follow these instructions carefully (unless otherwise directed):  An IV will be required for this test and Nitroglycerin  will be given.  Hold all erectile dysfunction medications at least 3 days (72 hrs) prior to test. (Ie viagra, cialis, sildenafil, tadalafil, etc)   On the Night Before the Test: Be sure to Drink plenty of water. Do not consume any caffeinated/decaffeinated beverages or chocolate 12 hours prior to your test. Do not take any antihistamines 12 hours prior to your test.   On the Day of the Test: Drink plenty of water until 1 hour prior to the test. Do not eat any food 1 hour prior to test. You may take your regular medications prior to the test.  Take metoprolol   (Lopressor ) one (1) tablet by mouth ( 50 mg) two hours prior to test.      After the Test: Drink plenty of water. After receiving IV contrast, you may experience a mild flushed feeling. This is normal. On occasion, you may experience a mild rash up to 24 hours after the test. This is not dangerous. If this occurs, you can take Benadryl  25 mg, Zyrtec, Claritin, or Allegra and increase your fluid intake. (Patients taking Tikosyn should avoid Benadryl , and may take Zyrtec, Claritin, or Allegra) If you experience trouble breathing, this can be serious. If it is severe call 911 IMMEDIATELY. If it is mild, please call our office.  We will call to schedule your test 2-4 weeks out understanding that some insurance companies will need an authorization prior to the service being performed.   For more information and frequently asked questions, please visit our website : [email or web address]  For non-scheduling related questions, please contact the cardiac imaging nurse navigator should you have any questions/concerns: Cardiac Imaging Nurse Navigators Direct Office Dial: [phone]   For scheduling needs, including cancellations and rescheduling, please call [place], [phone].   Follow-Up: At [facility], you and your health needs are our priority.  As part of our continuing mission to provide you with exceptional heart care, our providers are all part of one team.  This team includes your primary Cardiologist (physician) and Advanced Practice Providers or APPs (Physician Assistants and Nurse Practitioners) who all work together to provide you with the  care you need, when you need it.  Your next appointment:   6 month(s)  Provider:   Rosaline Bane, NP    We recommend signing up for the patient portal called MyChart.  Sign up information is provided on this After Visit Summary.  MyChart is used to connect with patients for Virtual Visits (Telemedicine).  Patients  are able to view lab/test results, encounter notes, upcoming appointments, etc.  Non-urgent messages can be sent to your provider as well.   To learn more about what you can do with MyChart, go to [email or web address].   Other Instructions  Your physician wants you to follow-up in: 6 months.  You will receive a reminder letter in the mail two months in advance. If you don't receive a letter, please call our office to schedule the follow-up appointment.

## 2024-04-15 DIAGNOSIS — N201 Calculus of ureter: Secondary | ICD-10-CM | POA: Diagnosis not present

## 2024-04-18 ENCOUNTER — Encounter (HOSPITAL_COMMUNITY): Payer: Self-pay

## 2024-04-22 ENCOUNTER — Encounter: Payer: Self-pay | Admitting: Pulmonary Disease

## 2024-04-22 ENCOUNTER — Ambulatory Visit: Admitting: Pulmonary Disease

## 2024-04-22 ENCOUNTER — Ambulatory Visit (HOSPITAL_COMMUNITY)
Admission: RE | Admit: 2024-04-22 | Discharge: 2024-04-22 | Disposition: A | Discharge status: Home / Self Care | Source: Ambulatory Visit | Attending: Cardiovascular Disease | Admitting: Cardiovascular Disease

## 2024-04-22 VITALS — BP 115/79 | HR 75 | Ht 70.0 in | Wt 196.0 lb

## 2024-04-22 DIAGNOSIS — E785 Hyperlipidemia, unspecified: Secondary | ICD-10-CM

## 2024-04-22 DIAGNOSIS — I517 Cardiomegaly: Secondary | ICD-10-CM | POA: Diagnosis not present

## 2024-04-22 DIAGNOSIS — R0602 Shortness of breath: Secondary | ICD-10-CM

## 2024-04-22 DIAGNOSIS — I351 Nonrheumatic aortic (valve) insufficiency: Secondary | ICD-10-CM

## 2024-04-22 DIAGNOSIS — R0609 Other forms of dyspnea: Secondary | ICD-10-CM

## 2024-04-22 DIAGNOSIS — I2510 Atherosclerotic heart disease of native coronary artery without angina pectoris: Secondary | ICD-10-CM | POA: Diagnosis not present

## 2024-04-22 DIAGNOSIS — R079 Chest pain, unspecified: Secondary | ICD-10-CM

## 2024-04-22 DIAGNOSIS — I2583 Coronary atherosclerosis due to lipid rich plaque: Secondary | ICD-10-CM | POA: Insufficient documentation

## 2024-04-22 DIAGNOSIS — K219 Gastro-esophageal reflux disease without esophagitis: Secondary | ICD-10-CM

## 2024-04-22 DIAGNOSIS — J4520 Mild intermittent asthma, uncomplicated: Secondary | ICD-10-CM

## 2024-04-22 DIAGNOSIS — J45990 Exercise induced bronchospasm: Secondary | ICD-10-CM

## 2024-04-22 MED ORDER — IOHEXOL 350 MG/ML SOLN
100.0000 mL | Freq: Once | INTRAVENOUS | Status: AC | PRN
Start: 2024-04-22 — End: 2024-04-22
  Administered 2024-04-22: 100 mL via INTRAVENOUS

## 2024-04-22 MED ORDER — TRELEGY ELLIPTA 100-62.5-25 MCG/ACT IN AEPB: INHALATION_SPRAY | RESPIRATORY_TRACT | Status: DC

## 2024-04-22 MED ORDER — NITROGLYCERIN 0.4 MG SL SUBL
0.8000 mg | SUBLINGUAL_TABLET | Freq: Once | SUBLINGUAL | Status: AC
  Administered 2024-04-22: 0.8 mg via SUBLINGUAL

## 2024-04-22 NOTE — Progress Notes (Unsigned)
 Synopsis: Referred in February 2024 for shortness of breath by Charmaine Bright, PA  Subjective:   PATIENT ID: Billy Erickson GENDER: male DOB: 05-18-1946, MRN: [id number]  HPI  Chief Complaint  Patient presents with   Medical Management of Chronic Issues    Pt states SOB during activities  etc., wondering if it could be from lyrica he's been on it several months and one of the side effects he states is shallow breathing (?)   Billy Erickson is a 78 year old male, never smoker with history of GERD, HLD and mild aortic regurgitation who returns to pulmonary clinic for shortness of breath.   He experiences increased shortness of breath, especially since starting flute lessons, finding it difficult to sustain a tone. Episodes of gasping for breath occur during moderate activities like walking up stairs or yard work, resolving after 90 seconds of rest when his heart rate reaches about 110 bpm.  He has a history of cardiac issues with a noted blockage in a cardiac artery, awaiting a CT angiogram. Previous pulmonary function tests were normal, and he underwent a cardiopulmonary exercise test. He uses albuterol  as a rescue inhaler and Wixela twice daily, though he is uncertain of the albuterol 's effectiveness.  He has never smoked tobacco but was exposed to secondhand smoke in childhood. 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   OV 11/21/22 He was trialed on spiriva  2.5mcg 2 puffs daily after initial visit with some improvement in his dyspnea.   He was seen by Izetta Rouleau, NP 09/25/22. PFTs were within normal limits. FeNO was within normal limits. He was started on steroid nasal spray and antihistamine allergy medicine. IgE level 12 and absolute eosinophils 100.   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 He has normal exercise capacity overall.   Initial OV  09/01/22 He reports having dyspnea since 2020. He notices the shortness of breath with going up stairs, bending over, or doing heavy exertional work in his garden. He denies wheezing or cough. He has been using albuterol  inhaler as needed for the dyspnea with out relief. He denies any night time awakenings due to shortness of breath.   He was seen by cardiology 06/27/22. Echo showed hypderdynamic LV with EF 70-75%. He had extended heart monitoring performed with no significant arrhythmias noted. 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  He is a retired Radio producer. He was active during his career as he commuted from [place] to [employer] via bike. He continues to exercise most days of the week. Denies any limitations with his work outs. He was a Surveyor, minerals for 4-5 years. No dust or chemical exposures over the years. He is working on his family genealogy. He has 2 grandchildren. His father died of obstructive lung disease.   Past Medical History:  Diagnosis Date   Aortic regurgitation    mild to moderate AR 05/29/16 echo [facility])   Arthritis    Dyspnea    with exercise only   Enlarged prostate    GERD (gastroesophageal reflux disease)    History of kidney stones    Hyperlipidemia    Kidney stones    Liver cyst    Wrist pain, right      Family History  Problem Relation Age of Onset   Alzheimer's  disease Mother      Social History   Socioeconomic History   Marital status: Married    Spouse name: Not on file   Number of children: 3   Years of education: Not on file   Highest education level: Not on file  Occupational History   Not on file  Tobacco Use   Smoking status: Never   Smokeless tobacco: Never  Vaping Use   Vaping status: Never Used  Substance and Sexual Activity   Alcohol use: Yes    Alcohol/week: 6.0 standard drinks of alcohol    Types: 6 Glasses  of wine per week    Comment: social   Drug use: No   Sexual activity: Not on file  Other Topics Concern   Not on file  Social History Narrative   Not on file   Social Drivers of Health   Financial Resource Strain: Low Risk  (04/18/2021)   Received from [facility]   Overall Financial Resource Strain (CARDIA)    Difficulty of Paying Living Expenses: Not hard at all  Food Insecurity: No Food Insecurity (04/18/2021)   Received from [facility]   Hunger Vital Sign    Within the past 12 months, you worried that your food would run out before you got the money to buy more.: Never true    Within the past 12 months, the food you bought just didn't last and you didn't have money to get more.: Never true  Transportation Needs: No Transportation Needs (04/18/2021)   Received from [facility] - Transportation    Lack of Transportation (Medical): No    Lack of Transportation (Non-Medical): No  Physical Activity: Sufficiently Active (04/18/2021)   Received from [facility]   Exercise Vital Sign    On average, how many days per week do you engage in moderate to strenuous exercise (like a brisk walk)?: 4 days    On average, how many minutes do you engage in exercise at this level?: 50 min  Stress: Stress Concern Present (04/18/2021)   Received from [facility] - Occupational Stress Questionnaire    Feeling of Stress : Rather much  Social Connections: Unknown (12/01/2021)   Received from [facility]   Social Network    Social Network: Not on file  Intimate Partner Violence: Unknown (10/28/2021)   Received from [facility]   HITS    Physically Hurt: Not on file    Insult or Talk Down To: Not on file    Threaten Physical Harm: Not on file    Scream or Curse: Not on file     Allergies  Allergen Reactions   Diphenhydramine  Hcl     Other reaction(s): Other (See Comments) jittery   Sudafed [Pseudoephedrine] Anxiety and Other  (See Comments)    Anxious, unable to urinate,jittery     Outpatient Medications Prior to Visit  Medication Sig Dispense Refill   albuterol  (VENTOLIN  HFA) 108 (90 Base) MCG/ACT inhaler Inhale 2 puffs into the lungs every 6 (six) hours as needed for wheezing or shortness of breath.     ezetimibe  (ZETIA ) 10 MG tablet Take 1 tablet (10 mg total) by mouth daily. Pt needs to keep September appt for any future refills. 90 tablet 3   HYDROcodone -acetaminophen  (NORCO) 10-325 MG tablet Take 0.5-1 tablets by mouth as needed for moderate pain (pain score 4-6).     ibuprofen (ADVIL,MOTRIN) 100 MG tablet Take 200 mg by mouth every 6 (  six) hours as needed for pain or fever.     memantine  (NAMENDA ) 10 MG tablet Take 1 tablet (10 mg total) by mouth 2 (two) times daily. 60 tablet 5   methocarbamol  (ROBAXIN ) 500 MG tablet Take 1 tablet (500 mg total) by mouth every 6 (six) hours as needed for muscle spasms. 90 tablet 2   metoprolol  tartrate (LOPRESSOR ) 50 MG tablet Take 1 tablet (50 mg total) by mouth once for 1 dose. Take 90-120 minutes prior to scan. Hold for SBP less than 110. 1 tablet 0   omeprazole (PRILOSEC) 20 MG capsule Take 20 mg by mouth daily.     pregabalin (LYRICA) 75 MG capsule Take 75 mg by mouth 2 (two) times daily.     rosuvastatin  (CRESTOR ) 40 MG tablet Take 1 tablet (40 mg total) by mouth daily. 90 tablet 3   sertraline (ZOLOFT) 50 MG tablet Take 50 mg by mouth daily.     tadalafil (CIALIS) 5 MG tablet Take 5 mg by mouth daily as needed.     tamsulosin (FLOMAX) 0.4 MG CAPS capsule Take 0.4 mg by mouth daily.  3   WIXELA INHUB 250-50 MCG/ACT AEPB INHALE 1 PUFF INTO THE LUNGS IN THE MORNING AND AT BEDTIME. 60 each 0   No facility-administered medications prior to visit.    Review of Systems  Constitutional:  Negative for chills, fever, malaise/fatigue and weight loss.  HENT:  Negative for congestion, sinus pain and sore throat.   Eyes: Negative.   Respiratory:  Positive for shortness of  breath (with exertion). Negative for cough, hemoptysis, sputum production and wheezing.   Cardiovascular:  Negative for chest pain, palpitations, orthopnea, claudication and leg swelling.  Gastrointestinal:  Negative for abdominal pain, heartburn, nausea and vomiting.  Genitourinary: Negative.   Musculoskeletal:  Negative for joint pain and myalgias.  Skin:  Negative for rash.  Neurological:  Negative for weakness.  Endo/Heme/Allergies: Negative.   Psychiatric/Behavioral: Negative.     Objective:   Vitals:   04/22/24 0844  BP: 115/79  Pulse: 75  SpO2: 97%  Weight: 196 lb (88.9 kg)  Height: 5' 10 (1.778 m)     Physical Exam Constitutional:      General: He is not in acute distress. HENT:     Head: Normocephalic and atraumatic.  Eyes:     Conjunctiva/sclera: Conjunctivae normal.  Cardiovascular:     Rate and Rhythm: Normal rate and regular rhythm.     Pulses: Normal pulses.     Heart sounds: Normal heart sounds. No murmur heard. Musculoskeletal:     Right lower leg: No edema.     Left lower leg: No edema.  Skin:    General: Skin is warm and dry.  Neurological:     General: No focal deficit present.     Mental Status: He is alert.     CBC    Component Value Date/Time   WBC 7.5 02/25/2024 1325   RBC 4.56 02/25/2024 1325   HGB 14.2 02/25/2024 1325   HGB 15.8 11/19/2023 1529   HCT 43.2 02/25/2024 1325   HCT 46.5 11/19/2023 1529   PLT 141 (L) 02/25/2024 1325   PLT 195 11/19/2023 1529   MCV 94.7 02/25/2024 1325   MCV 92 11/19/2023 1529   MCH 31.1 02/25/2024 1325   MCHC 32.9 02/25/2024 1325   RDW 12.2 02/25/2024 1325   RDW 12.8 11/19/2023 1529   LYMPHSABS 1.0 11/19/2023 1529   MONOABS 0.6 09/25/2022 1632   EOSABS 0.1 11/19/2023 1529  BASOSABS 0.0 11/19/2023 1529      Latest Ref Rng & Units 02/25/2024    1:25 PM 06/27/2022    4:04 PM 08/19/2020   11:09 AM  BMP  Glucose 70 - 99 mg/dL 878  890  98   BUN 8 - 23 mg/dL 21  21  18    Creatinine 0.61 - 1.24 mg/dL  8.68  8.70  8.91   BUN/Creat Ratio 10 - 24  16    Sodium 135 - 145 mmol/L 137  141  141   Potassium 3.5 - 5.1 mmol/L 4.5  4.4  4.1   Chloride 98 - 111 mmol/L 102  105  106   CO2 22 - 32 mmol/L 25  22  26    Calcium  8.9 - 10.3 mg/dL 9.5  89.9  9.3    Chest imaging: CXR 07/31/22 Lungs are adequately inflated and otherwise clear. Cardiomediastinal silhouette and remainder of the exam is unchanged.  PFT:    Latest Ref Rng & Units 09/25/2022    2:57 PM  PFT Results  FVC-Pre L 4.84   FVC-Predicted Pre % 114   FVC-Post L 4.74   FVC-Predicted Post % 111   Pre FEV1/FVC % % 73   Post FEV1/FCV % % 75   FEV1-Pre L 3.51   FEV1-Predicted Pre % 114   FEV1-Post L 3.55   DLCO uncorrected ml/min/mmHg 25.71   DLCO UNC% % 102   DLCO corrected ml/min/mmHg 25.71   DLCO COR %Predicted % 102   DLVA Predicted % 85   TLC L 8.08   TLC % Predicted % 114   RV % Predicted % 100     Labs:  Path:  Echo 07/21/22: LV EF 70-75% with hyperdynamic function. Grade I diastolic dysfunction. RV systolic size and function are normal. Mild aortic valve regurgitation.  Long Term Heart Monitor - 7 days 07/13/22   Predominately normal sinus rhythm   Rare episodes of nonsustained supraventricular tachycardia.   4-6 beats.   these are not significant   No serious arrhythmias to explain his episodes of near syncope  Heart Catheterization:  Cardiopulmonary Exercise Test 11/10/22 Conclusion: Exercise testing with gas exchange demonstrates normal (actually excellent) functional capacity when compared to matched sedentary norms. Patient appears primarily ventilatory limited with depleted breathing reserve and is most likely demonstrating hyperventilation at peak exercise (with high VE/MVV, low PETCO2). However, elevated pulmonary pressured with exercise cannot be excluded. Although it does not meet diagnostic criteria, there was a 12% reduction in FEV1  post-exercise and is suggestive of exercise induced bronchospasm or  reactive airway with exertion.   Assessment & Plan:   No diagnosis found.  Discussion: Billy Erickson is a 79 year old male, never smoker with history of GERD, HLD and mild aortic regurgitation who returns to pulmonary clinic for shortness of breath.   Shortness of breath under evaluation for obstructive or interstitial lung disease Chronic dyspnea with activity and flute playing. Normal pulmonary function tests reduce interstitial lung disease concern. Subtle crackles suggest fluid, inflammation, or atelectasis. Differential includes obstructive or interstitial lung disease. High-resolution CT scan needed for subtle changes. - Order high-resolution CT scan of the chest. - Schedule repeat pulmonary function tests. - Provide sample of Trelegy inhaler for one month trial in place of Wixela. - Instruct to rinse mouth after using Trelegy inhaler to prevent thrush. - Discontinue Wixela while trying Trelegy. - If Trelegy effective, send prescription and discontinue Wixela.  Deconditioning contributing to dyspnea Deconditioning may contribute to dyspnea.  Emphasized importance of regular physical activity to improve conditioning and lung function. - Encourage regular exercise, 20-30 minutes of walking 4-5 days a week. - Advise incorporating resistance exercises as tolerated. - Encourage maintaining physical activity during travel or after illness.  Follow up in 2 months.  Dorn Chill, MD [facility] Office: [phone]    Current Outpatient Medications:    albuterol  (VENTOLIN  HFA) 108 (90 Base) MCG/ACT inhaler, Inhale 2 puffs into the lungs every 6 (six) hours as needed for wheezing or shortness of breath., Disp: , Rfl:    ezetimibe  (ZETIA ) 10 MG tablet, Take 1 tablet (10 mg total) by mouth daily. Pt needs to keep September appt for any future refills., Disp: 90 tablet, Rfl: 3   HYDROcodone -acetaminophen  (NORCO) 10-325 MG tablet, Take 0.5-1 tablets by mouth as  needed for moderate pain (pain score 4-6)., Disp: , Rfl:    ibuprofen (ADVIL,MOTRIN) 100 MG tablet, Take 200 mg by mouth every 6 (six) hours as needed for pain or fever., Disp: , Rfl:    memantine  (NAMENDA ) 10 MG tablet, Take 1 tablet (10 mg total) by mouth 2 (two) times daily., Disp: 60 tablet, Rfl: 5   methocarbamol  (ROBAXIN ) 500 MG tablet, Take 1 tablet (500 mg total) by mouth every 6 (six) hours as needed for muscle spasms., Disp: 90 tablet, Rfl: 2   metoprolol  tartrate (LOPRESSOR ) 50 MG tablet, Take 1 tablet (50 mg total) by mouth once for 1 dose. Take 90-120 minutes prior to scan. Hold for SBP less than 110., Disp: 1 tablet, Rfl: 0   omeprazole (PRILOSEC) 20 MG capsule, Take 20 mg by mouth daily., Disp: , Rfl:    pregabalin (LYRICA) 75 MG capsule, Take 75 mg by mouth 2 (two) times daily., Disp: , Rfl:    rosuvastatin  (CRESTOR ) 40 MG tablet, Take 1 tablet (40 mg total) by mouth daily., Disp: 90 tablet, Rfl: 3   sertraline (ZOLOFT) 50 MG tablet, Take 50 mg by mouth daily., Disp: , Rfl:    tadalafil (CIALIS) 5 MG tablet, Take 5 mg by mouth daily as needed., Disp: , Rfl:    tamsulosin (FLOMAX) 0.4 MG CAPS capsule, Take 0.4 mg by mouth daily., Disp: , Rfl: 3   WIXELA INHUB 250-50 MCG/ACT AEPB, INHALE 1 PUFF INTO THE LUNGS IN THE MORNING AND AT BEDTIME., Disp: 60 each, Rfl: 0

## 2024-04-22 NOTE — Patient Instructions (Addendum)
 Try trelegy ellipta 1 puff daily - rinse mouth out after each use  Stop wixella inhaler while trying trelegy  Continue to use albuterol  1-2 puffs every 4-6 hours as needed  We will schedule you for pulmonary function tests at front desk  We will schedule you for CT Chest scan  Follow up in 2 months to review test results

## 2024-04-23 ENCOUNTER — Ambulatory Visit: Payer: Self-pay | Admitting: Nurse Practitioner

## 2024-04-23 DIAGNOSIS — R0609 Other forms of dyspnea: Secondary | ICD-10-CM

## 2024-04-23 DIAGNOSIS — I517 Cardiomegaly: Secondary | ICD-10-CM

## 2024-04-23 NOTE — Telephone Encounter (Signed)
 Per Rosaline Bane, NP recommend he wait 8-12 weeks for repeat labs would please order echo for RV enlargement.  Order for echo placed.

## 2024-04-24 DIAGNOSIS — G4733 Obstructive sleep apnea (adult) (pediatric): Secondary | ICD-10-CM | POA: Diagnosis not present

## 2024-04-25 ENCOUNTER — Encounter: Payer: Self-pay | Admitting: Pulmonary Disease

## 2024-04-25 ENCOUNTER — Ambulatory Visit (INDEPENDENT_AMBULATORY_CARE_PROVIDER_SITE_OTHER): Admitting: *Deleted

## 2024-04-25 DIAGNOSIS — R0609 Other forms of dyspnea: Secondary | ICD-10-CM | POA: Diagnosis not present

## 2024-04-25 LAB — PULMONARY FUNCTION TEST
DL/VA % pred: 79 %
DL/VA: 3.14 ml/min/mmHg/L
DLCO cor % pred: 86 %
DLCO cor: 21.66 ml/min/mmHg
DLCO unc % pred: 86 %
DLCO unc: 21.66 ml/min/mmHg
FEF 25-75 Post: 3.42 L/s
FEF 25-75 Pre: 2.19 L/s
FEF2575-%Change-Post: 56 %
FEF2575-%Pred-Post: 157 %
FEF2575-%Pred-Pre: 100 %
FEV1-%Change-Post: 15 %
FEV1-%Pred-Post: 115 %
FEV1-%Pred-Pre: 99 %
FEV1-Post: 3.48 L
FEV1-Pre: 3.02 L
FEV1FVC-%Change-Post: 2 %
FEV1FVC-%Pred-Pre: 100 %
FEV6-%Change-Post: 12 %
FEV6-%Pred-Post: 117 %
FEV6-%Pred-Pre: 104 %
FEV6-Post: 4.61 L
FEV6-Pre: 4.12 L
FEV6FVC-%Change-Post: 0 %
FEV6FVC-%Pred-Post: 105 %
FEV6FVC-%Pred-Pre: 105 %
FVC-%Change-Post: 12 %
FVC-%Pred-Post: 111 %
FVC-%Pred-Pre: 98 %
FVC-Post: 4.66 L
FVC-Pre: 4.15 L
Post FEV1/FVC ratio: 75 %
Post FEV6/FVC ratio: 99 %
Pre FEV1/FVC ratio: 73 %
Pre FEV6/FVC Ratio: 99 %
RV % pred: 70 %
RV: 1.81 L
TLC % pred: 92 %
TLC: 6.52 L

## 2024-04-25 NOTE — Progress Notes (Signed)
 Full PFT performed today.

## 2024-04-25 NOTE — Patient Instructions (Signed)
 Full PFT performed today.

## 2024-05-01 ENCOUNTER — Ambulatory Visit (HOSPITAL_COMMUNITY)
Admission: RE | Admit: 2024-05-01 | Discharge: 2024-05-01 | Disposition: A | Discharge status: Home / Self Care | Source: Ambulatory Visit | Attending: Pulmonary Disease | Admitting: Pulmonary Disease

## 2024-05-01 DIAGNOSIS — R918 Other nonspecific abnormal finding of lung field: Secondary | ICD-10-CM | POA: Diagnosis not present

## 2024-05-01 DIAGNOSIS — R0609 Other forms of dyspnea: Secondary | ICD-10-CM | POA: Diagnosis not present

## 2024-05-04 ENCOUNTER — Encounter: Payer: Self-pay | Admitting: Pulmonary Disease

## 2024-05-04 DIAGNOSIS — K769 Liver disease, unspecified: Secondary | ICD-10-CM

## 2024-05-05 NOTE — Telephone Encounter (Signed)
**Note De-identified  [place] Obfuscation** Please advise 

## 2024-05-07 ENCOUNTER — Telehealth: Payer: Self-pay | Admitting: Pulmonary Disease

## 2024-05-07 ENCOUNTER — Other Ambulatory Visit: Payer: Self-pay | Admitting: Pulmonary Disease

## 2024-05-07 DIAGNOSIS — J4540 Moderate persistent asthma, uncomplicated: Secondary | ICD-10-CM

## 2024-05-07 MED ORDER — TRELEGY ELLIPTA 100-62.5-25 MCG/ACT IN AEPB: 1.0000 | INHALATION_SPRAY | Freq: Every day | RESPIRATORY_TRACT | 5 refills | Status: DC

## 2024-05-07 NOTE — Telephone Encounter (Signed)
 Dewald spoke with patient.NFn

## 2024-05-07 NOTE — Telephone Encounter (Signed)
 I spoke with patient via phone.  Reviewed CT Chest scan results. MRI of the liver has been ordered to evaluate the liver lesions.   Reviewed PFTs and that they are normal but TLC and LDCO have trended down.  Reviewed mild scarring of the lung in RML and that it does not fit a specific pattern of ILD at this time but recommend PFTs and HRCT Chest in 1 year.   He does note improvement from Trelegy and would like a prescription.  He is undergoing sleep testing and has follow up appointment next week.  Maintain follow up visits as planned.  Dorn Chill, MD [facility] Office: [phone]   See Amion for personal pager PCCM on call pager [phone] until 7pm. Please call [facility] 7p-7a. [phone]

## 2024-05-09 ENCOUNTER — Ambulatory Visit (HOSPITAL_COMMUNITY)
Admission: RE | Admit: 2024-05-09 | Discharge: 2024-05-09 | Disposition: A | Discharge status: Home / Self Care | Source: Ambulatory Visit | Attending: Pulmonary Disease | Admitting: Pulmonary Disease

## 2024-05-09 DIAGNOSIS — K8689 Other specified diseases of pancreas: Secondary | ICD-10-CM | POA: Diagnosis not present

## 2024-05-09 DIAGNOSIS — K769 Liver disease, unspecified: Secondary | ICD-10-CM | POA: Insufficient documentation

## 2024-05-09 DIAGNOSIS — N281 Cyst of kidney, acquired: Secondary | ICD-10-CM | POA: Diagnosis not present

## 2024-05-09 MED ORDER — GADOBUTROL 1 MMOL/ML IV SOLN
8.0000 mL | Freq: Once | INTRAVENOUS | Status: AC | PRN
  Administered 2024-05-09: 8 mL via INTRAVENOUS

## 2024-05-19 ENCOUNTER — Ambulatory Visit: Payer: Self-pay | Admitting: Pulmonary Disease

## 2024-05-19 DIAGNOSIS — K7689 Other specified diseases of liver: Secondary | ICD-10-CM

## 2024-05-19 NOTE — Telephone Encounter (Signed)
 VM/ Lm okay per DPR, request that he calls back to schedule with lab   - NFN

## 2024-05-19 NOTE — Progress Notes (Signed)
 I tried to call patient last week with no answer. Please let him know that I spoke with one of our GI specialists who recommended checking a lab test called AFP and a visit with the GI specialist. I have placed a referral to [facility]. I have ordered the lab test that he can schedule a lab draw to have that done. Thanks

## 2024-05-20 ENCOUNTER — Encounter (HOSPITAL_BASED_OUTPATIENT_CLINIC_OR_DEPARTMENT_OTHER): Payer: Self-pay

## 2024-05-22 ENCOUNTER — Other Ambulatory Visit (HOSPITAL_BASED_OUTPATIENT_CLINIC_OR_DEPARTMENT_OTHER)

## 2024-05-28 ENCOUNTER — Encounter: Attending: Psychology | Admitting: Psychology

## 2024-05-28 DIAGNOSIS — L918 Other hypertrophic disorders of the skin: Secondary | ICD-10-CM | POA: Diagnosis not present

## 2024-05-28 DIAGNOSIS — L821 Other seborrheic keratosis: Secondary | ICD-10-CM | POA: Diagnosis not present

## 2024-05-28 DIAGNOSIS — B078 Other viral warts: Secondary | ICD-10-CM | POA: Diagnosis not present

## 2024-05-28 DIAGNOSIS — D225 Melanocytic nevi of trunk: Secondary | ICD-10-CM | POA: Diagnosis not present

## 2024-05-28 DIAGNOSIS — L905 Scar conditions and fibrosis of skin: Secondary | ICD-10-CM | POA: Diagnosis not present

## 2024-05-28 DIAGNOSIS — D485 Neoplasm of uncertain behavior of skin: Secondary | ICD-10-CM | POA: Diagnosis not present

## 2024-05-28 NOTE — Progress Notes (Incomplete)
 NEUROPSYCHOLOGICAL EVALUATION [place]  Physical Medicine and Rehabilitation     Patient: Billy Erickson  MRN: [id number] DOB: 08/02/1945  Age: 78 y.o. Sex: male  Race/Ethnicity: White or Caucasian *** Years of Education: *** Handedness: ***  Collateral Information Source: ***  Referring Provider: Dohmeier, Dedra, MD  Provider/Clinical Neuropsychologist: Evalene DOROTHA Riff, PsyD  Date of Service: *** Start Time: *** End Time: ***  Location of Service:  [facility] Physical Medicine & Rehabilitation Department [place]. [facility] [address], [place] [place], [place] [address] Phone: [phone]  Billing Code/Service:            96116/96121  Individuals Present: Patient was seen ***, in-person, by the provider. 1 hour and 15 minutes spent in face-to-face clinical interview and remaining 45 minutes was spent in record review, documentation, and testing protocol construction.    PATIENT CONSENT AND CONFIDENTIALITY The patient's understanding of the reason for referral was intact. Discussed limits of confidentiality including, but not limited to, posting of final evaluation report in the patient's electronic medical record for both the patient and for the referring provider and appropriate medical professionals. Patient was given the opportunity to have their questions answered. The neuropsychological evaluation process was discussed with the patient and they consented to proceed with the evaluation.  Consent for Evaluation and Treatment: Signed: Yes Explanation of Privacy Policies: Signed: Yes Discussion of Confidentiality Limits: Yes  REASON FOR REFERRAL:The patient was referred for neuropsychological evaluation by ***   Upon interview, the patient ***  HISTORY OF PRESENTING CONCERNS:  Cognitive Symptom Onset & Course:      Current Cognitive Complaints:  Memory: ***  Processing Speed: ***  Attention & Concentration: ***   Language: ***  Visual-Spatial: ***  Executive Functioning: ***  Motor/Sensory Complaints:  Sensory changes: *** Balance/coordination difficulties:*** Frequent instances of dizziness/vertigo: *** Other motor difficulties: ***  Emotional and Behavioral Functioning:  Depression: *** Anxiety: *** Other: ***  Sleep: *** Appetite: *** in appetite. *** unintentional weight loss.  Caffeine: ***  Alcohol Use: *** Tobacco Use: *** Recreational Substance Use: ***   Level of Functional Independence: The patient is *** with basic activities of daily living.  Finances: ***   Shopping / Meal Preparation: Household Maintenance / Chores: Firefighter / Future Obligations: Medication Management: ***   Driving: ***    Medical History/Record Review: Per records and patient report, History of traumatic brain injury/concussion: ***   History of stroke: ***   History of heart attack: ***   History of cancer/chemotherapy:***   History of seizure activity: ***   History of known exposure to toxins: ***   Symptoms of chronic pain: ***   Experience of frequent headaches/migraines: ***    Imaging/Lab Results:  12/21/23 NM PET METABOLIC BRAIN  IMPRESSION: Normal cerebral cortical metabolism. 08/25/2023 MRI HEAD WITHOUT CONTRAST  IMPRESSION: No acute or reversible finding. Age related volume loss without subjective lobar predominance. Minimal small vessel change of the deep white matter, less than often seen at this age.  Past Medical History:  Diagnosis Date   Aortic regurgitation    mild to moderate AR 05/29/16 echo [facility])   Arthritis    Dyspnea    with exercise only   Enlarged prostate    GERD (gastroesophageal reflux disease)    History of kidney stones    Hyperlipidemia    Kidney stones    Liver cyst    Wrist pain, right  Patient Active Problem List   Diagnosis Date Noted   Depression 03/20/2024   Mild cognitive impairment of uncertain or  unknown etiology 03/20/2024   Allergic rhinitis 09/27/2022   Reactive airway disease 09/26/2022   Amnesia 10/14/2021   Benign prostatic hyperplasia without lower urinary tract symptoms 10/14/2021   Chronic pain 10/14/2021   Chronic sinusitis 10/14/2021   ED (erectile dysfunction) of organic origin 10/14/2021   Exercise induced bronchospasm 10/14/2021   Gastroesophageal reflux disease 10/14/2021   Generalized abdominal pain 10/14/2021   Hepatic cyst 10/14/2021   Insomnia 10/14/2021   Kidney stone 10/14/2021   Overactive bladder 10/14/2021   History of colonic polyps 10/14/2021   Sciatica 10/14/2021   Coronary artery calcification 03/31/2020   Hyperlipidemia 03/31/2020   DOE (dyspnea on exertion) 11/07/2018   Chest tightness 11/07/2018   Lumbar radiculopathy 01/08/2018   Abnormal cardiovascular stress test 05/23/2016   Dyspnea 05/23/2016   Acute meniscal tear of knee 11/12/2012   Knee pain 09/24/2012    Family Neurologic/Medical Hx: *** Family History  Problem Relation Age of Onset   Alzheimer's disease Mother     Medications:   Academic/Vocational History: ***  Psychosocial: Marital Status: *** Children/Grandchildren: *** Living Situation: *** Daily Activities/Hobbies: ***  Mental Status/Behavioral Observations: The patient was seen on an outpatient basis in the [facility] PM&R office for the clinical interview *** Sensorium/Arousal: *** Orientation: *** Appearance: *** Behavior: *** Speech/Language: *** Motor: *** Social Comportment: *** Mood: *** Affect: *** Thought Process/Content: *** Ability to Participate in Interview: *** Insight: ***  SUMMARY / CLINICAL IMPRESSIONS ***  DISPOSITION / PLAN The patient has been set up for a formal neuropsychological assessment to objectively assess her cognitive functioning across domains to establish the patient's cognitive profile. This data, in conjunction with information obtained via clinical interview and  medical record review, will help clarify likely etiology and guide treatment recommendations. Once data collection and interpretation have been completed, the findings / diagnosis and recommendations will be reviewed and discussed with the patient during a feedback appointment with the neuropsychologist. Based on the collaborative dialogue with the patient during the feedback, recommendations may be adjusted / tailored as needed. A formal report will be produced and provided to the patient and the referring provider.   Diagnosis:     Evalene DOROTHA Riff, PsyD Clinical Neuropsychology [address] [place], [place] [address] Main: [phone] Fax: [phone]  This report was generated using voice recognition software. While this document has been carefully reviewed, transcription errors may be present. I apologize in advance for any inconvenience. Please contact me if further clarification is needed.

## 2024-06-02 ENCOUNTER — Telehealth: Payer: Self-pay | Admitting: *Deleted

## 2024-06-02 NOTE — Telephone Encounter (Signed)
 Patient advised per phone note from JD. Patient has GI appt Patient informed if he wants to have lab drawn here he would need to go on lab schedule. He is going out of town so, will call back to schedule lab test. NFN   Copied from CRM [id number]. Topic: Clinical - Lab/Test Results >> Jun 02, 2024  9:10 AM Isabell A wrote: Reason for CRM: Patient returning phone call from [facility] for results.   Callback number: [phone]

## 2024-06-11 ENCOUNTER — Encounter

## 2024-06-11 NOTE — Progress Notes (Unsigned)
 Mental Status/Behavioral Observations (06/11/2024):  Orientation: The patient was oriented to self, place, and time.  Sensory/Arousal: Hearing and vision were adequate for testing. The patient was alert. Appearance: Dress and hygiene were appropriate for the setting.   Speech/Language: In conversation, the patient's speech was prosodic, fluent, and well-articulated. The patient displayed no indications of word finding difficulties and no word substitution errors were observed.  Motor: The patient ambulated independently and without issue. No tremors were observed.  Social Comportment: Social behavior was appropriate to the setting. Mood/Affect: Mood was largely neutral to positive. Affect was consistent with mood.  Attention/Concentration:  The patient appeared to maintain consistent engagement throughout the testing session. No frank attentional lapses were observed.  Thought Process/Content: The patient's thought process was coherent, linear, goal directed. There were no indications of psychosis.  Additional Observations: The patient showed no difficulties with understanding task instructions. He reported feeling mildly frustrated at times throughout the test.  Neuropsychology Note Billy Erickson completed 150 minutes of neuropsychological testing with technician, Josue Ned, BA, under the supervision of Evalene Riff, PsyD., Clinical Neuropsychologist. The patient did not appear overtly distressed by the testing session, per behavioral observation or via self-report to the technician. Rest breaks were offered.   Clinical Decision Making: In considering the patient's current level of functioning, level of presumed impairment, nature of symptoms, emotional and behavioral responses during clinical interview, level of literacy, and observed level of motivation/effort, a battery of tests was selected by Dr. Riff during initial consultation on 05/28/2024. This was communicated to the  technician. Communication between the neuropsychologist and technician was ongoing throughout the testing session and changes were made as deemed necessary based on patient performance on testing, technician observations and additional pertinent factors such as those listed above.  Tests Administered: Automatic Data Edition (BNT-2) Brief Visuospatial Memory Test-Revised (BVMT-R) [place]  Verbal Learning Test-Third Edition (CVLT-3) Clock Drawing Test Controlled Oral Word Association Test (FAS & Animals) Delis-Kaplan Executive Function System (D-KEFS), select subtests Repeatable Battery for the Assessment of Neuropsychological Status Update (RBANS) Trail Making Test (TMT; Part A & B) Wechsler Adult Intelligence Scale-Fourth Edition (WAIS-IV), select subtests Wechsler Abbreviated Scale of Intelligence CIVIL SERVICE FAST STREAMER) Wechsler Memory Scale-Fourth Edition (WMS-IV) , select subtests Wechsler Memory Scale-Third Edition (WMS-III), select subtests  The Beck Depression Inventory-II (BDI-II) Beck Anxiety Inventory (BAI)  Results: Note: This summary of test scores accompanies the interpretive report and should not be interpreted by unqualified individuals or in isolation without reference to the report. Test scores are relative to age, gender, and educational history as available and appropriate. Measurement properties of test scores: IQ, Index, and Standard Scores (SS): Mean = 100; Standard Deviation = 15; Scaled Scores (ss): Mean = 10; Standard Deviation = 3; Z scores (Z): Mean = 0; Standard Deviation = 1; T scores (T); Mean = 50; Standard Deviation = 10  Intellectual/Premorbid Functioning Estimate   Norm Score Percentile  Range  WASI-II FSIQ-2  SS = 142 99.7 %ile Exceptionally High   Vocabulary   t = 80 99.9 %ile Exceptionally High   Matrix Reasoning   t = 69 97 %ile Above Average   ATTENTION AND WORKING MEMORY    Norm Score Percentile  Range  WAIS-IV          Digit Span  ss = 10 50 %ile  Average   DSF  ss = 10 50 %ile Average   Span:    6      DSB  ss = 10 50 %ile Average  Span:    5      DSS  ss = 9 37 %ile Average   Span:    4     WMS-III          Spatial Span  ss = 7 16 %ile Low Average   SSF  ss = 5 5 %ile Below Average   Span:    3      SSB  ss = 10 50 %ile Average   Span:    4      PROCESSING SPEED    Norm Score Percentile  Range  WAIS-IV          Coding  ss = 15 95 %ile Above Average   Symbol Search  ss = 11 63 %ile Average   LANGUAGE    Norm Score Percentile  Range  Boston Naming Test (BNT-2)  t = 73 99 %ile Exceptionally High  COWAT          FAS  t = 51 53 %ile Average   Animals  t = 59 81 %ile High Average   EXECUTIVE FUNCTIONING    Norm Score Percentile  Range  DKEFS - Color-Word Interference          Color Naming  ss = 9 37 %ile Average   Word Reading  ss = 10 50 %ile Average   Inhibition  ss = 11 63 %ile Average   Errors  ss = 13 84 %ile High Average   Inhibition Switching  ss = 13 84 %ile High Average   Errors  ss = 12 75 %ile High Average  Trails A  t = 48 42 %ile Average  Trails B  t = 60 84 %ile High Average   MEMORY    Norm Score Percentile  Range  BVMT-R          Trial 1  t = 57.0 75 %ile High Average   Trial 2  t = 45 30 %ile Average   Trial 3  t = 40.0 16 %ile Low Average   Total Recall  t = 46.0 32 %ile Average   Learning  t = 30.0 2 %ile Below Average   Delayed Recall  t = 43.0 25 %ile Average   % Retained    100 >16 %ile WNL   Hits     >16 %ile WNL   False Alarms     >16 %ile WNL   Recognition Discriminability     >16 %ile WNL  CVLT-III          Trial 1  ss = 16.0 98 %ile Exceptionally High   Trial 2  ss = 15.0 95 %ile Above Average   Trial 3  ss = 13.0 84 %ile High Average   Trial 4  ss = 15.0 95 %ile Above Average   Trial 5  ss = 15.0 95 %ile Above Average   Trial B  ss = 13.0 84 %ile High Average   Short Delay Free Recall  ss = 15.0 95 %ile Above Average   Short Delay Cued Recall  ss = 14.0 91 %ile Above Average    Long Delay Free Recall  ss = 14.0 91 %ile Above Average   Long Delay Cued Recall  ss = 13.0 84 %ile High Average   Total Hits  ss = 12.0 75 %ile High Average   Total False Positives  ss = 12.0 75 %ile High Average   Recognition Discriminability  ss = 13.0  84 %ile High Average   Total Intrusions  ss = 9.0 37 %ile Average   Trials 1-5 Total Correct  SS = 129 97 %ile Above Average   Total Repetitions  ss = 11.0 63 %ile Average   List B vs. Trial 1  ss = 11.0 63 %ile Average   SD (FR) vs. Trial 5 Correct  ss = 12.0 75 %ile High Average   LD (FR)vs. SD (FR)  ss = 11.0 63 %ile Average  Wechsler Memory Scale, 4th Edition (WMS-4)         Log. Mem. Immediate Recall  ss = 11 63 %ile Average   Logical Memory Delayed Recall  ss = 11 63 %ile Average   Logical Recognition    17th-25th  %ile Average to Low Average   VISUAL-SPATIAL    Norm Score Percentile  Range  WAIS-IV          Block Design  ss = 12 75 %ile High Average  WASI-II          Matrix Reasoning  t = 69 97 %ile Above Average  Clock                   RBANS Visuospatial Index          RBANS Figure Copy  ss = 4 2 %ile Below Average   RBANS Line Orientation      51st-75th  %ile Average to High Average   PERSONALITY AND BEHAVIORAL FUNCTIONING      Score/Interpretation  BDI Raw       15  BDI Severity       Mild.  BAI Raw       25  BAI Severity       Moderate.    Feedback to Patient: Billy Erickson will return on 06/24/2024 for an interactive feedback session with Dr. Hayden at which time his test performances, clinical impressions and treatment recommendations will be reviewed in detail. The patient understands he can contact our office should he require our assistance before this time.  150 minutes spent face-to-face with patient administering standardized tests, 90 minutes spent scoring radiographer, therapeutic). [CPT [id number], [id number]]  Full report to follow.

## 2024-06-16 ENCOUNTER — Encounter (HOSPITAL_BASED_OUTPATIENT_CLINIC_OR_DEPARTMENT_OTHER): Payer: Self-pay

## 2024-06-17 ENCOUNTER — Encounter: Admitting: Psychology

## 2024-06-17 ENCOUNTER — Ambulatory Visit (INDEPENDENT_AMBULATORY_CARE_PROVIDER_SITE_OTHER)

## 2024-06-17 DIAGNOSIS — I517 Cardiomegaly: Secondary | ICD-10-CM | POA: Diagnosis not present

## 2024-06-17 DIAGNOSIS — R0609 Other forms of dyspnea: Secondary | ICD-10-CM

## 2024-06-17 LAB — ECHOCARDIOGRAM COMPLETE
Area-P 1/2: 2.71 cm2
P 1/2 time: 563 ms
S' Lateral: 2.41 cm

## 2024-06-18 ENCOUNTER — Ambulatory Visit (HOSPITAL_BASED_OUTPATIENT_CLINIC_OR_DEPARTMENT_OTHER): Payer: Self-pay | Admitting: Nurse Practitioner

## 2024-06-23 ENCOUNTER — Ambulatory Visit: Admitting: Pulmonary Disease

## 2024-06-23 ENCOUNTER — Telehealth: Payer: Self-pay

## 2024-06-23 ENCOUNTER — Encounter: Payer: Self-pay | Admitting: Pulmonary Disease

## 2024-06-23 VITALS — BP 124/81 | HR 68 | Ht 70.0 in | Wt 192.0 lb

## 2024-06-23 DIAGNOSIS — R918 Other nonspecific abnormal finding of lung field: Secondary | ICD-10-CM | POA: Diagnosis not present

## 2024-06-23 DIAGNOSIS — K7689 Other specified diseases of liver: Secondary | ICD-10-CM

## 2024-06-23 DIAGNOSIS — J4540 Moderate persistent asthma, uncomplicated: Secondary | ICD-10-CM | POA: Diagnosis not present

## 2024-06-23 DIAGNOSIS — N1831 Chronic kidney disease, stage 3a: Secondary | ICD-10-CM

## 2024-06-23 NOTE — Telephone Encounter (Signed)
 Request made for sleep Study records  [facility]

## 2024-06-23 NOTE — Progress Notes (Unsigned)
 Established Patient Pulmonology Office Visit   Subjective:  Patient ID: Billy Erickson, male    DOB: 1946-04-18  MRN: [id number]  CC:  Chief Complaint  Patient presents with   Medical Management of Chronic Issues    Pt states nothing new same problems     Discussed the use of AI scribe software for clinical note transcription with the patient, who gave verbal consent to proceed.  History of Present Illness KRAYTON WORTLEY is a 78 year old male with mild aortic regurgitation who presents with shortness of breath.  He has persistent exertional shortness of breath with minimal activities such as climbing stairs or vacuuming, though he tolerates pushing a self-propelled lawnmower better. Trelegy once daily gives immediate but short-lived relief. Albuterol  inhalers do not help. High-resolution CT chest from 04/22/2024 showed subpleural scarring, interlobular septal thickening, and a linear band in the posterior medial right lower lobe and subpleural right middle lobe, without honeycombing or architectural distortion, and scattered pulmonary micronodules including a 4 mm nodule in the left lower lobe. Pulmonary function tests from 04/25/2024 were normal but showed a significant bronchodilator response.  He has liver and renal cysts, including a slowly enlarging right hepatic lobe cyst with internal hemorrhage and multiple bilateral renal cysts on MRI from 05/09/2024. He is awaiting GI evaluation for these lesions.  He has kidney stones and underwent removal of a large stone in 03/2024. He has chronic back and muscle pain treated with hydrocodone  daily and has lumbar fusion and arthritis, which limit activity and may contribute to perceived dyspnea.  He completed a home sleep study and awaits follow-up. He wakes with a dry mouth, which he associates with possible sleep apnea. He also has chronic joint and back pain related to arthritis and prior lumbar fusion.        ROS    Current  Outpatient Medications:    albuterol  (VENTOLIN  HFA) 108 (90 Base) MCG/ACT inhaler, Inhale 2 puffs into the lungs every 6 (six) hours as needed for wheezing or shortness of breath., Disp: , Rfl:    ezetimibe  (ZETIA ) 10 MG tablet, Take 1 tablet (10 mg total) by mouth daily. Pt needs to keep September appt for any future refills., Disp: 90 tablet, Rfl: 3   HYDROcodone -acetaminophen  (NORCO) 10-325 MG tablet, Take 0.5-1 tablets by mouth as needed for moderate pain (pain score 4-6)., Disp: , Rfl:    ibuprofen (ADVIL,MOTRIN) 100 MG tablet, Take 200 mg by mouth every 6 (six) hours as needed for pain or fever., Disp: , Rfl:    memantine  (NAMENDA ) 10 MG tablet, Take 1 tablet (10 mg total) by mouth 2 (two) times daily., Disp: 60 tablet, Rfl: 5   methocarbamol  (ROBAXIN ) 500 MG tablet, Take 1 tablet (500 mg total) by mouth every 6 (six) hours as needed for muscle spasms., Disp: 90 tablet, Rfl: 2   omeprazole (PRILOSEC) 20 MG capsule, Take 20 mg by mouth daily., Disp: , Rfl:    pregabalin (LYRICA) 75 MG capsule, Take 75 mg by mouth 2 (two) times daily., Disp: , Rfl:    rosuvastatin  (CRESTOR ) 40 MG tablet, Take 1 tablet (40 mg total) by mouth daily., Disp: 90 tablet, Rfl: 3   sertraline (ZOLOFT) 50 MG tablet, Take 50 mg by mouth daily., Disp: , Rfl:    tadalafil (CIALIS) 5 MG tablet, Take 5 mg by mouth daily as needed., Disp: , Rfl:    tamsulosin (FLOMAX) 0.4 MG CAPS capsule, Take 0.4 mg by mouth daily., Disp: , Rfl:  3   TRELEGY ELLIPTA  100-62.5-25 MCG/ACT AEPB, TAKE 1 PUFF BY MOUTH EVERY DAY, Disp: 60 each, Rfl: 5   metoprolol  tartrate (LOPRESSOR ) 50 MG tablet, Take 1 tablet (50 mg total) by mouth once for 1 dose. Take 90-120 minutes prior to scan. Hold for SBP less than 110. (Patient not taking: Reported on 06/23/2024), Disp: 1 tablet, Rfl: 0      Objective:  BP 124/81   Pulse 68   Ht 5' 10 (1.778 m) Comment: per pt  Wt 192 lb (87.1 kg)   SpO2 95%   BMI 27.55 kg/m     Physical Exam Constitutional:       General: He is not in acute distress.    Appearance: Normal appearance.  Eyes:     General: No scleral icterus.    Conjunctiva/sclera: Conjunctivae normal.  Cardiovascular:     Rate and Rhythm: Normal rate and regular rhythm.  Pulmonary:     Breath sounds: No wheezing, rhonchi or rales.  Musculoskeletal:     Right lower leg: No edema.     Left lower leg: No edema.  Skin:    General: Skin is warm and dry.  Neurological:     General: No focal deficit present.      Diagnostic Review:  Last CBC Lab Results  Component Value Date   WBC 7.5 02/25/2024   HGB 14.2 02/25/2024   HCT 43.2 02/25/2024   MCV 94.7 02/25/2024   MCH 31.1 02/25/2024   RDW 12.2 02/25/2024   PLT 141 (L) 02/25/2024   Last metabolic panel Lab Results  Component Value Date   GLUCOSE 121 (H) 02/25/2024   NA 137 02/25/2024   K 4.5 02/25/2024   CL 102 02/25/2024   CO2 25 02/25/2024   BUN 21 02/25/2024   CREATININE 1.31 (H) 02/25/2024   GFRNONAA 56 (L) 02/25/2024   CALCIUM  9.5 02/25/2024   PROT 7.2 11/19/2023   ALBUMIN  4.7 06/27/2022   LABGLOB 3.1 11/19/2023   AGRATIO 1.3 11/19/2023   BILITOT 0.5 06/27/2022   ALKPHOS 70 06/27/2022   AST 39 06/27/2022   ALT 36 06/27/2022   ANIONGAP 10 02/25/2024   HRCT Chest 05/01/24 Subpleural mild interlobular septal thickening and thin bands of scarring likely sequela from prior infectious/inflammatory process predominantly involving the right lower lobe and right middle lobe. No significant air trapping or honeycombing.   A few scattered pulmonary nodules up to 4 mm. No follow-up needed if patient is low-risk (and has no known or suspected primary neoplasm). Non-contrast chest CT can be considered in 12 months if patient is high-risk. This recommendation follows the consensus statement: Guidelines for Management of Incidental Pulmonary Nodules Detected on CT Images: From the Fleischner Society 2017; Radiology 2017; 284:228-243.   Multiple liver lesions,  index lesion containing dense material may represent a complex cyst versus a necrotic mass. Recommend dedicated liver protocol MRI with contrast for further assessment.     Assessment & Plan:   Assessment & Plan Moderate persistent asthma without complication     Interstitial lung abnormality (ILA)  Orders:   Pulmonary Function Test; Future  Liver cyst  Orders:   AFP tumor marker   Assessment and Plan Assessment & Plan Shortness of breath and interstitial lung abnormality under evaluation Persistent dyspnea with interstitial lung abnormality on CT. Normal PFTs with bronchodilator response. Differential includes early interstitial lung disease or deconditioning. Negative ANA. Autoimmune etiology considered due to joint and muscle pain. Discussed potential immunosuppressive therapy risks. - Continue Trelegy inhaler  once daily. - Repeat CT scan in one year. - Repeat pulmonary function tests in April. - Simple walk test today did not show evidence of desaturations - Consider extensive inflammatory lab panel if further changes noted. - Encouraged walking routine, starting with 5-10 minutes and gradually increasing to 30 minutes daily.  Moderate persistent asthma Asthma managed with Trelegy inhaler. Immediate relief noted but no sustained improvement. Albuterol  provides limited effect. - Continue Trelegy inhaler once daily.  Liver cysts with internal hemorrhage, right hepatic lobe MRI shows enlarging cyst with hemorrhage. Multiple simple cysts noted. AFP level to assess malignancy risk. Imaging suggests benign cysts. - Ordered AFP level blood test. - Maintain appointment with GI for further evaluation.  Multiple bilateral renal cysts MRI shows multiple bilateral renal cysts.  - Will discuss with urology team regarding renal cysts and potential referral to nephrology for possible polycystic kidney disease.  History of nephrolithiasis Recent lithotripsy for large kidney stone.  No current stones on ultrasound. - Continue follow-up with urology for nephrolithiasis management.  Obstructive sleep apnea, evaluation in progress Undergoing evaluation with home sleep study. Awaiting results and potential CPAP intervention. - Follow up with sleep specialist for sleep study results and potential CPAP initiation.      Return in about 4 months (around 10/22/2024) for f/u visit Dr. Kara.   Dorn KATHEE Kara, MD

## 2024-06-23 NOTE — Patient Instructions (Addendum)
 We will request the sleep study results from [facility]  Your breathing tests remain within normal limits  Your CT chest scan shows areas of interstitial lung abnormality  We will consider inflammatory lab testing to check for autoimmune disorders that can affect the lung  We will walk you today to check your oxygen levels during activity  Continue trelegy ellipta  1 puff daily - rinse mouth out after each use  Schedule pulmonary function tests at the front desk for April 2026  Follow up in April 2026

## 2024-06-24 ENCOUNTER — Encounter: Payer: Self-pay | Admitting: Pulmonary Disease

## 2024-06-24 ENCOUNTER — Telehealth: Payer: Self-pay | Admitting: Pulmonary Disease

## 2024-06-24 ENCOUNTER — Encounter: Attending: Psychology | Admitting: Psychology

## 2024-06-24 DIAGNOSIS — R0609 Other forms of dyspnea: Secondary | ICD-10-CM

## 2024-06-24 NOTE — Telephone Encounter (Signed)
 X1  will try again

## 2024-06-24 NOTE — Telephone Encounter (Signed)
 Spoke with someone from [facility] message has been relayed to Dr.Wrenn message was sent to him with all info and will return call    -NFN

## 2024-06-24 NOTE — Telephone Encounter (Signed)
 X 2   [facility] - [phone]

## 2024-06-24 NOTE — Addendum Note (Signed)
 Addended by: Amyia Lodwick on: 06/24/2024 01:52 PM   Modules accepted: Orders

## 2024-06-24 NOTE — Telephone Encounter (Signed)
 Please call [facility] and leave a request for Dr. Watt to call when he is available to discuss Billy Erickson history of renal cysts.   Thanks, JD

## 2024-06-27 LAB — AFP TUMOR MARKER: AFP-Tumor Marker: 5.7 ng/mL (ref <6.1)

## 2024-06-30 DIAGNOSIS — G4733 Obstructive sleep apnea (adult) (pediatric): Secondary | ICD-10-CM | POA: Diagnosis not present

## 2024-07-05 ENCOUNTER — Other Ambulatory Visit: Payer: Self-pay | Admitting: Neurology

## 2024-07-07 NOTE — Progress Notes (Unsigned)
 07/07/2024 Billy Erickson [id number] May 08, 1946   CHIEF COMPLAINT: Liver cysts  HISTORY OF PRESENT ILLNESS: Billy Erickson is a 78 year old male with a past medical history of depression, mild AR, COPD, kidney stones and GERD. He presents to our office today as referred by Dr. Dorn Chill for further evaluation regarding a liver cyst.   Discussed the use of AI scribe software for clinical note transcription with the patient, who gave verbal consent to proceed.  History of Present Illness Billy Erickson is a 78 year old male who presents with liver cysts. He was referred by Dr. Chill for evaluation of liver cysts discovered during a workup for shortness of breath.  He underwent a chest CT 05/01/2024 which showed subpleural mild intralobular septal thickening with evidence of scarring likely sequela from prior infectious/inflammatory process involving the right lower lobe and right middle lobe and a few scattered pulmonary nodules.  The CT also identified multiple liver lesions with index lesion containing dense material possibly representing a complex cyst versus a necrotic mass.  He subsequently underwent an abdominal MRI with and without contrast 05/09/2024 which showed a 5.2 x 5.8 cm slowly enlarging cyst with internal hemorrhage in the right hepatic lobe, multiple additional simple liver cysts without suspicious liver lesions. Multiple bilateral renal cysts with largest arising from the right kidney lower pole, which can be characterized as a Bosniak category 2 cyst. No suspicious renal mass.   AFP level was 5.7 on 06/23/2024, ordered per his PCP.  He denies having any history of liver disease.  No known family history of liver disease.  He denies having any abdominal pain.  Bowel movements are normal.  No bloody or black stools.  He has a history of acid reflux for which he takes  Omeprazole 20mg  daily.  He endorses having heartburn if he misses 1 dose of Pantoprazole.  No  dysphagia.  He mentions a past colonoscopy but is unsure of the details or the provider. He recalls being told he may have 'aged out' of needing further colonoscopies.  He has a history of COPD and is currently being worked up for shortness of breath as noted above he experiences ongoing shortness of breath and fatigue, which have been present for many years. He has been diagnosed with sleep apnea and is about to start using a CPAP machine.         Latest Ref Rng & Units 02/25/2024    1:25 PM 11/19/2023    3:29 PM 06/27/2022    4:04 PM  CMP  Glucose 70 - 99 mg/dL 878   890   BUN 8 - 23 mg/dL 21   21   Creatinine 9.38 - 1.24 mg/dL 8.68   8.70   Sodium 864 - 145 mmol/L 137   141   Potassium 3.5 - 5.1 mmol/L 4.5   4.4   Chloride 98 - 111 mmol/L 102   105   CO2 22 - 32 mmol/L 25   22   Calcium  8.9 - 10.3 mg/dL 9.5   89.9   Total Protein 6.0 - 8.5 g/dL  7.2  7.1   Total Bilirubin 0.0 - 1.2 mg/dL   0.5   Alkaline Phos 44 - 121 IU/L   70   AST 0 - 40 IU/L   39   ALT 0 - 44 IU/L   36    AFP 5.7 on 06/23/2024  Abdominal MRI with and without contrast 05/09/2024 FINDINGS:  Lower  chest: Unremarkable MR appearance to the lung bases. No  pleural effusion. No pericardial effusion. Normal heart size.   Hepatobiliary: The liver is normal in size. Noncirrhotic  configuration. There is a well-circumscribed 5.2 x 5.8 cm  predominantly T2 hyperintense and T1 hypointense lesion centered in  the right hepatic lobe, segment 5/6. The lesion exhibit dependent T1  hyperintense and T2 hypointense areas. No internal enhancement seen  on the subtraction images. The lesion is present since the prior  study from 03/18/2017 when it measured up to 4.5 x 4.8 cm. However,  internal nonenhancing T1 hyperintense areas are new in the interim  and favored to represent interval hemorrhage within the simple cyst.   There also multiple additional simple cysts throughout the liver  with largest in the left hepatic  dome segment 4A measuring up to 2.0  x 2.1 cm. No intrahepatic or extrahepatic bile duct dilatation. No  choledocholithiasis. Very tiny focal fundal adenomyomatosis noted in  the gallbladder. The gallbladder is otherwise unremarkable.   Pancreas: There is mild diffuse atrophy/fatty replacement of the  pancreas. No focal lesion.   Spleen:  Within normal limits in size and appearance. No focal mass.   Adrenals/Urinary Tract: Unremarkable adrenal glands. No  hydroureteronephrosis. There are multiple cysts throughout bilateral  kidneys with largest arising from the right kidney lower pole, which  exhibit few peripheral thin smooth septations measuring up to 4.2 x  7.1 cm, which can be characterized as a Bosniak category 2 cyst. The  other smaller cysts can be categorized as Bosniak category 1 cysts.  No suspicious renal mass.   Stomach/Bowel: Visualized portions within the abdomen are  unremarkable. No disproportionate dilation of bowel loops.  Unremarkable appendix.   Vascular/Lymphatic: No pathologically enlarged lymph nodes  identified. No abdominal aortic aneurysm demonstrated. No ascites.   Other: Susceptibility artifact from lower lumbar spinal fixation  hardware noted.   Musculoskeletal: No suspicious bone lesions identified.   IMPRESSION:  1. There is a 5.2 x 5.8 cm slowly enlarging cyst with internal  hemorrhage in the right hepatic lobe, segment 5/6, as described in  detail above. There are multiple additional simple liver cysts as  well. No suspicious liver lesion.  2. Multiple bilateral renal cysts with largest arising from the  right kidney lower pole, which can be characterized as a Bosniak  category 2 cyst. No suspicious renal mass.  3. Multiple other nonacute observations, as described above.    Chest CT 05/01/2024: IMPRESSION: Subpleural mild interlobular septal thickening and thin bands of scarring likely sequela from prior infectious/inflammatory  process predominantly involving the right lower lobe and right middle lobe. No significant air trapping or honeycombing.   A few scattered pulmonary nodules up to 4 mm. No follow-up needed if patient is low-risk (and has no known or suspected primary neoplasm). Non-contrast chest CT can be considered in 12 months if patient is high-risk. This recommendation follows the consensus statement: Guidelines for Management of Incidental Pulmonary Nodules Detected on CT Images: From the Fleischner Society 2017; Radiology 2017; 284:228-243.   Multiple liver lesions, index lesion containing dense material may represent a complex cyst versus a necrotic mass. Recommend dedicated liver protocol MRI with contrast for further assessment.   Echo 07/21/22: LV EF 70-75% with hyperdynamic function. Grade I diastolic dysfunction. RV systolic size and function are normal. Mild aortic valve regurgitation.  Past Medical History:  Diagnosis Date   Aortic regurgitation    mild to moderate AR 05/29/16 echo [facility])  Arthritis    Dyspnea    with exercise only   Enlarged prostate    GERD (gastroesophageal reflux disease)    History of kidney stones    Hyperlipidemia    Kidney stones    Liver cyst    Wrist pain, right    Past Surgical History:  Procedure Laterality Date   ANTERIOR CRUCIATE LIGAMENT REPAIR     ANTERIOR LAT LUMBAR FUSION Right 01/08/2018   Procedure: ANTERIOR LATERAL INTERBODY FUSION LUMBAR THREE- LUMBAR FOUR, LUMBAR FOUR- LUMBAR FIVE;  Surgeon: Lanis Pupa, MD;  Location: [facility];  Service: Neurosurgery;  Laterality: Right;  LUMBAR INTERBODY FUSION   APPLICATION OF ROBOTIC ASSISTANCE FOR SPINAL PROCEDURE N/A 01/08/2018   Procedure: APPLICATION OF ROBOTIC ASSISTANCE FOR SPINAL PROCEDURE;  Surgeon: Lanis Pupa, MD;  Location: [facility];  Service: Neurosurgery;  Laterality: N/A;  APPLICATION OF ROBOTIC ASSISTANCE FOR SPINAL PROCEDURE   CYSTOSCOPY/RETROGRADE/URETEROSCOPY   02/13/2008   basketing of fragments   CYSTOSCOPY/URETEROSCOPY/HOLMIUM LASER/STENT PLACEMENT Right 02/25/2024   Procedure: CYSTOSCOPY/URETEROSCOPY/HOLMIUM LASER/STENT PLACEMENT;  Surgeon: Watt Rush, MD;  Location: [facility];  Service: Urology;  Laterality: Right;   EXTRACORPOREAL SHOCK WAVE LITHOTRIPSY Left 03/19/2017   Procedure: LEFT EXTRACORPOREAL SHOCK WAVE LITHOTRIPSY (ESWL);  Surgeon: Cam Morene ORN, MD;  Location: [facility];  Service: Urology;  Laterality: Left;   KNEE ARTHROSCOPY WITH MEDIAL MENISECTOMY Left 08/18/2016   Procedure: LEFT KNEE ARTHROSCOPY, CHONDROPLASTY AND PARTIAL MEDIAL AND LATERAL MENISCECTOMY;  Surgeon: Evalene JONETTA Chancy, MD;  Location: [facility];  Service: Orthopedics;  Laterality: Left;   KNEE SURGERY Right    LUMBAR PERCUTANEOUS PEDICLE SCREW 3 LEVEL N/A 01/08/2018   Procedure: LUMBAR PERCUTANEOUS PEDICLE SCREW PLACEMENT LUMBAR THREE-FOUR, LUMBAR FOUR-FIVE, LUMBAR FIVE-SACRAL ONE;  Surgeon: Lanis Pupa, MD;  Location: [facility];  Service: Neurosurgery;  Laterality: N/A;   MRI     nuclear stress test     TONSILLECTOMY     TRANSFORAMINAL LUMBAR INTERBODY FUSION (TLIF) WITH PEDICLE SCREW FIXATION 1 LEVEL N/A 01/08/2018   Procedure: TRANSFORAMINAL LUMBAR INTERBODY FUSION LUMBAR FIVE-SACRAL ONE;  Surgeon: Lanis Pupa, MD;  Location: [facility];  Service: Neurosurgery;  Laterality: N/A;   WRIST ARTHROSCOPY WITH ULNA SHORTENING Right 07/06/2015   Procedure: RIGHT WRIST WITH  ARTHROSCOPIC DEBRIDEMENT AND ULNAR SHORTENING OSTEOTOMY;  Surgeon: Alm Hummer, MD;  Location: [facility];  Service: Orthopedics;  Laterality: Right;   Social History: He is married.  He has 2 sons and 1 daughter.  He is a retired radio producer.  Non-smoker.  He drinks 1 alcoholic beverage daily.  No drug use.  Family History: Mother with history of Alzheimer's disease.  Brother with history of esophageal cancer.  Allergies[1]   Outpatient Encounter Medications  as of 07/08/2024  Medication Sig   albuterol  (VENTOLIN  HFA) 108 (90 Base) MCG/ACT inhaler Inhale 2 puffs into the lungs every 6 (six) hours as needed for wheezing or shortness of breath.   ezetimibe  (ZETIA ) 10 MG tablet Take 1 tablet (10 mg total) by mouth daily. Pt needs to keep September appt for any future refills.   HYDROcodone -acetaminophen  (NORCO) 10-325 MG tablet Take 0.5-1 tablets by mouth as needed for moderate pain (pain score 4-6).   ibuprofen (ADVIL,MOTRIN) 100 MG tablet Take 200 mg by mouth every 6 (six) hours as needed for pain or fever.   memantine  (NAMENDA ) 10 MG tablet Take 1 tablet (10 mg total) by mouth 2 (two) times daily.   methocarbamol  (ROBAXIN ) 500 MG tablet Take 1 tablet (500 mg total)  by mouth every 6 (six) hours as needed for muscle spasms.   metoprolol  tartrate (LOPRESSOR ) 50 MG tablet Take 1 tablet (50 mg total) by mouth once for 1 dose. Take 90-120 minutes prior to scan. Hold for SBP less than 110. (Patient not taking: Reported on 06/23/2024)   omeprazole (PRILOSEC) 20 MG capsule Take 20 mg by mouth daily.   pregabalin (LYRICA) 75 MG capsule Take 75 mg by mouth 2 (two) times daily.   rosuvastatin  (CRESTOR ) 40 MG tablet Take 1 tablet (40 mg total) by mouth daily.   sertraline (ZOLOFT) 50 MG tablet Take 50 mg by mouth daily.   tadalafil (CIALIS) 5 MG tablet Take 5 mg by mouth daily as needed.   tamsulosin (FLOMAX) 0.4 MG CAPS capsule Take 0.4 mg by mouth daily.   TRELEGY ELLIPTA  100-62.5-25 MCG/ACT AEPB TAKE 1 PUFF BY MOUTH EVERY DAY   No facility-administered encounter medications on file as of 07/08/2024.   REVIEW OF SYSTEMS:  Gen: Denies fever, sweats or chills. No weight loss.  CV: Denies chest pain, palpitations or edema. Resp: + Cough and SOB, see HPI. GI: See HPI. GU: Denies urinary burning, blood in urine, increased urinary frequency or incontinence. MS: + Back pain and muscle cramps. Derm: Denies rash, itchiness, skin lesions or unhealing ulcers. Psych:  Denies depression, anxiety, memory loss or confusion. Heme: Denies bruising, easy bleeding. Neuro:  + Headaches. Endo:  Denies any problems with DM, thyroid  or adrenal function.  PHYSICAL EXAM: There were no vitals taken for this visit. General: in no acute distress. Head: Normocephalic and atraumatic. Eyes:  Sclerae non-icteric, conjunctive pink. Ears: Normal auditory acuity. Mouth: Dentition intact. No ulcers or lesions.  Neck: Supple, no lymphadenopathy or thyromegaly.  Lungs: Clear bilaterally to auscultation without wheezes, crackles or rhonchi. Heart: Regular rate and rhythm. No murmur, rub or gallop appreciated.  Abdomen: Soft, nontender, nondistended. No masses. No hepatosplenomegaly. Normoactive bowel sounds x 4 quadrants.  Rectal:  Musculoskeletal: Symmetrical with no gross deformities. Skin: Warm and dry. No rash or lesions on visible extremities. Extremities: No edema. Neurological: Alert oriented x 4, no focal deficits.  Psychological: Alert and cooperative. Normal mood and affect.  ASSESSMENT AND PLAN:  78 year old male undergoing pulmonary evaluation completed a chest CT 04/2024 with incidental finding of multiple liver cysts with concern of a complex cyst versus a necrotic mass. He subsequently underwent an abdominal MRI w/wo contrast 05/09/2024 which showed a 5.2 x 5.8 cm slowly enlarging cyst with internal hemorrhage in the right hepatic lobe, multiple additional simple liver cysts without suspicious liver lesions and multiple renal cysts were also noted possibly suggestive of polycystic kidney and liver disease.  AFP 5.7 on 06/23/2024.  No recent LFTs.  No prior history of liver disease.  No known family history of liver disease.  Asymptomatic - Hepatic panel, repeat CBC secondary to mild thrombocytopenia (to assess for any serological evidence of liver disease) - Liver biopsy or resection is not warranted at this juncture.  I explained to the patient that liver cysts are  benign but future imaging may be required to monitor the size of the liver cysts. It is a rare occurrence but there is potential for large liver cysts to rupture and bleed. - Dr. Leigh to review case, to verify timing of future liver imaging  Renal cysts. Abdominal MRI identified multiple bilateral renal cysts with largest arising from the right kidney lower pole, which can be characterized as a Bosniak category 2 cyst. No suspicious renal mass.  Suspect possible polycystic kidney disease. - Follow up with PCP  Chronic shortness of breath undergoing evaluation for obstructive versus interstitial lung disease.  Currently on a course of Prednisone and Azithromycin. - Continue follow-up with pulmonary  Colon cancer screening - Request copy of most recent colonoscopy procedure and biopsy results from PCP    CC:  Kara Dorn NOVAK, MD       [1]  Allergies Allergen Reactions   Diphenhydramine  Hcl     Other reaction(s): Other (See Comments) jittery   Sudafed [Pseudoephedrine] Anxiety and Other (See Comments)    Anxious, unable to urinate,jittery

## 2024-07-08 ENCOUNTER — Encounter: Payer: Self-pay | Admitting: Nurse Practitioner

## 2024-07-08 ENCOUNTER — Ambulatory Visit: Admitting: Nurse Practitioner

## 2024-07-08 ENCOUNTER — Ambulatory Visit (HOSPITAL_COMMUNITY)
Admission: RE | Admit: 2024-07-08 | Discharge: 2024-07-08 | Disposition: A | Source: Ambulatory Visit | Attending: Pulmonary Disease

## 2024-07-08 ENCOUNTER — Other Ambulatory Visit

## 2024-07-08 VITALS — BP 100/70 | HR 72 | Ht 68.5 in | Wt 199.4 lb

## 2024-07-08 DIAGNOSIS — E785 Hyperlipidemia, unspecified: Secondary | ICD-10-CM | POA: Insufficient documentation

## 2024-07-08 DIAGNOSIS — D696 Thrombocytopenia, unspecified: Secondary | ICD-10-CM

## 2024-07-08 DIAGNOSIS — K7689 Other specified diseases of liver: Secondary | ICD-10-CM

## 2024-07-08 DIAGNOSIS — I351 Nonrheumatic aortic (valve) insufficiency: Secondary | ICD-10-CM | POA: Diagnosis not present

## 2024-07-08 DIAGNOSIS — R0609 Other forms of dyspnea: Secondary | ICD-10-CM | POA: Diagnosis present

## 2024-07-08 DIAGNOSIS — I2510 Atherosclerotic heart disease of native coronary artery without angina pectoris: Secondary | ICD-10-CM | POA: Insufficient documentation

## 2024-07-08 DIAGNOSIS — R0602 Shortness of breath: Secondary | ICD-10-CM

## 2024-07-08 LAB — CBC
HCT: 43.6 % (ref 39.0–52.0)
Hemoglobin: 14.8 g/dL (ref 13.0–17.0)
MCHC: 33.9 g/dL (ref 30.0–36.0)
MCV: 91.1 fl (ref 78.0–100.0)
Platelets: 172 K/uL (ref 150.0–400.0)
RBC: 4.79 Mil/uL (ref 4.22–5.81)
RDW: 12.8 % (ref 11.5–15.5)
WBC: 10.7 K/uL — ABNORMAL HIGH (ref 4.0–10.5)

## 2024-07-08 LAB — HEPATIC FUNCTION PANEL
ALT: 22 U/L (ref 3–53)
AST: 24 U/L (ref 5–37)
Albumin: 4.6 g/dL (ref 3.5–5.2)
Alkaline Phosphatase: 56 U/L (ref 39–117)
Bilirubin, Direct: 0.1 mg/dL (ref 0.1–0.3)
Total Bilirubin: 0.5 mg/dL (ref 0.2–1.2)
Total Protein: 7.4 g/dL (ref 6.0–8.3)

## 2024-07-08 LAB — ECHOCARDIOGRAM LIMITED BUBBLE STUDY
Area-P 1/2: 3.85 cm2
S' Lateral: 2.7 cm
Single Plane A4C EF: 70.4 %

## 2024-07-08 NOTE — Patient Instructions (Signed)
 _______________________________________________________  If your blood pressure at your visit was 140/90 or greater, please contact your primary care physician to follow up on this.  _______________________________________________________  If you are age 76 or older, your body mass index should be between 23-30. Your Body mass index is 29.87 kg/m. If this is out of the aforementioned range listed, please consider follow up with your Primary Care Provider.  If you are age 41 or younger, your body mass index should be between 19-25. Your Body mass index is 29.87 kg/m. If this is out of the aformentioned range listed, please consider follow up with your Primary Care Provider.   ________________________________________________________  The [facility] providers would like to encourage you to use MYCHART to communicate with providers for non-urgent requests or questions.  Due to long hold times on the telephone, sending your provider a message by [facility] may be a faster and more efficient way to get a response.  Please allow 48 business hours for a response.  Please remember that this is for non-urgent requests.  _______________________________________________________  [facility] is using a team-based approach to care.  Your team is made up of your doctor and two to three APPS. Our APPS (Nurse Practitioners and Physician Assistants) work with your physician to ensure care continuity for you. They are fully qualified to address your health concerns and develop a treatment plan. They communicate directly with your gastroenterologist to care for you. Seeing the Advanced Practice Practitioners on your physician's team can help you by facilitating care more promptly, often allowing for earlier appointments, access to diagnostic testing, procedures, and other specialty referrals.   Your provider has requested that you go to the basement level for lab work before leaving today. Press B on the  elevator. The lab is located at the first door on the left as you exit the elevator.  Thank you for trusting me with your gastrointestinal care!   Elida Shawl, CRNP

## 2024-07-08 NOTE — Progress Notes (Signed)
 Agree with assessment and plan as outlined.  This patient has had renal and hepatic cysts dating back to at least 2010 when he first had abdominal imaging.  The dominant cyst appears to be a few centimeters larger than it was last imaged several years ago.  Management of polycystic liver disease focuses on primarily symptom relief as needed.  Most patients remain asymptomatic and require no intervention.  I agree with labs at this time.  I think this is something we should see him for at least annually and consider repeat imaging based on his course.  Generally for asymptomatic patients would not recommend any treatment.  If he had symptoms that bother him one can consider medical management with somatostatin analogs or cyst aspiration/sclerotherapy.  Consideration for repeat imaging on his follow-up visit in 1 year, if not sooner with any issues.

## 2024-07-08 NOTE — Progress Notes (Signed)
°  Echocardiogram 2D Echocardiogram has been performed.  Billy Erickson, RDCS 07/08/2024, 2:06 PM

## 2024-07-09 ENCOUNTER — Ambulatory Visit: Payer: Self-pay | Admitting: Nurse Practitioner

## 2024-07-10 NOTE — Progress Notes (Signed)
 1 year OV recall entered in epic.

## 2024-07-10 NOTE — Progress Notes (Signed)
 Linda/Dottie:  Please enter office visit recall 1 year, to consider repeat liver imaging at that time  I called the patient and discussed Dr. Hassan input.  Patient felt reassured and appreciated the call.  Patient was informed to follow-up in our office in 1 year and to contact our office if he develops any symptoms i.e. abdominal pain.   With history of fatty liver, he was reminded to avoid weight gain, reduce the carbohydrates in diet and try to lose weight to reduce his risk of developing fatty liver related disease.  Labs showed normal hepatic panel and normal platelet count which was reassuring as well.

## 2024-07-22 ENCOUNTER — Ambulatory Visit: Payer: Self-pay | Admitting: Pulmonary Disease

## 2024-10-21 ENCOUNTER — Ambulatory Visit: Admitting: Pulmonary Disease

## 2024-10-21 ENCOUNTER — Encounter
